# Patient Record
Sex: Female | Born: 1955 | Race: Black or African American | Hispanic: No | Marital: Married | Smoking: Former smoker
Health system: Southern US, Community
[De-identification: ages and names within clinical notes are randomized; demographics above are authoritative.]

## PROBLEM LIST (undated history)

## (undated) ENCOUNTER — Emergency Department (HOSPITAL_COMMUNITY): Admission: EM | Payer: Medicare Other | Source: Home / Self Care

## (undated) DIAGNOSIS — D649 Anemia, unspecified: Secondary | ICD-10-CM

## (undated) DIAGNOSIS — K219 Gastro-esophageal reflux disease without esophagitis: Secondary | ICD-10-CM

## (undated) DIAGNOSIS — Z972 Presence of dental prosthetic device (complete) (partial): Secondary | ICD-10-CM

## (undated) DIAGNOSIS — F419 Anxiety disorder, unspecified: Secondary | ICD-10-CM

## (undated) DIAGNOSIS — I10 Essential (primary) hypertension: Secondary | ICD-10-CM

## (undated) DIAGNOSIS — G5600 Carpal tunnel syndrome, unspecified upper limb: Secondary | ICD-10-CM

## (undated) DIAGNOSIS — E669 Obesity, unspecified: Secondary | ICD-10-CM

## (undated) DIAGNOSIS — E7800 Pure hypercholesterolemia, unspecified: Secondary | ICD-10-CM

## (undated) DIAGNOSIS — I209 Angina pectoris, unspecified: Secondary | ICD-10-CM

## (undated) HISTORY — DX: Carpal tunnel syndrome, unspecified upper limb: G56.00

## (undated) HISTORY — PX: SHOULDER SURGERY: SHX246

## (undated) HISTORY — PX: TONSILLECTOMY: SUR1361

## (undated) HISTORY — PX: ABDOMINAL HYSTERECTOMY: SHX81

## (undated) HISTORY — PX: BLADDER SURGERY: SHX569

## (undated) HISTORY — PX: BRAIN SURGERY: SHX531

## (undated) HISTORY — PX: VAGINAL HYSTERECTOMY: SUR661

## (undated) HISTORY — PX: COCCYX REMOVAL: SHX600

## (undated) HISTORY — PX: BACK SURGERY: SHX140

---

## 1998-02-19 ENCOUNTER — Ambulatory Visit (HOSPITAL_COMMUNITY): Admission: RE | Admit: 1998-02-19 | Discharge: 1998-02-19 | Payer: Self-pay | Admitting: *Deleted

## 1998-06-09 ENCOUNTER — Ambulatory Visit (HOSPITAL_COMMUNITY): Admission: RE | Admit: 1998-06-09 | Discharge: 1998-06-09 | Payer: Self-pay | Admitting: Internal Medicine

## 1998-09-09 ENCOUNTER — Encounter: Admission: RE | Admit: 1998-09-09 | Discharge: 1998-12-04 | Payer: Self-pay | Admitting: Internal Medicine

## 1998-09-27 ENCOUNTER — Emergency Department (HOSPITAL_COMMUNITY): Admission: EM | Admit: 1998-09-27 | Discharge: 1998-09-27 | Payer: Self-pay

## 1999-01-06 ENCOUNTER — Encounter: Admission: RE | Admit: 1999-01-06 | Discharge: 1999-01-07 | Payer: Self-pay | Admitting: Internal Medicine

## 1999-01-21 ENCOUNTER — Other Ambulatory Visit: Admission: RE | Admit: 1999-01-21 | Discharge: 1999-01-21 | Payer: Self-pay | Admitting: *Deleted

## 1999-04-13 ENCOUNTER — Encounter: Admission: RE | Admit: 1999-04-13 | Discharge: 1999-04-28 | Payer: Self-pay | Admitting: Internal Medicine

## 1999-07-27 ENCOUNTER — Encounter: Admission: RE | Admit: 1999-07-27 | Discharge: 1999-10-25 | Payer: Self-pay | Admitting: Orthopedic Surgery

## 1999-09-28 ENCOUNTER — Encounter: Admission: RE | Admit: 1999-09-28 | Discharge: 1999-11-18 | Payer: Self-pay | Admitting: Internal Medicine

## 1999-09-28 ENCOUNTER — Encounter: Admission: RE | Admit: 1999-09-28 | Discharge: 1999-09-28 | Payer: Self-pay | Admitting: General Surgery

## 1999-09-28 ENCOUNTER — Encounter: Payer: Self-pay | Admitting: General Surgery

## 1999-10-25 ENCOUNTER — Encounter: Admission: RE | Admit: 1999-10-25 | Discharge: 2000-01-23 | Payer: Self-pay | Admitting: Orthopedic Surgery

## 1999-12-04 ENCOUNTER — Emergency Department (HOSPITAL_COMMUNITY): Admission: EM | Admit: 1999-12-04 | Discharge: 1999-12-04 | Payer: Self-pay | Admitting: Emergency Medicine

## 2000-01-24 ENCOUNTER — Encounter: Admission: RE | Admit: 2000-01-24 | Discharge: 2000-04-23 | Payer: Self-pay | Admitting: Internal Medicine

## 2000-01-31 ENCOUNTER — Other Ambulatory Visit: Admission: RE | Admit: 2000-01-31 | Discharge: 2000-01-31 | Payer: Self-pay | Admitting: *Deleted

## 2000-05-22 ENCOUNTER — Encounter: Admission: RE | Admit: 2000-05-22 | Discharge: 2000-08-20 | Payer: Self-pay | Admitting: Orthopedic Surgery

## 2000-06-19 ENCOUNTER — Emergency Department (HOSPITAL_COMMUNITY): Admission: EM | Admit: 2000-06-19 | Discharge: 2000-06-19 | Payer: Self-pay | Admitting: Emergency Medicine

## 2000-10-02 ENCOUNTER — Encounter: Payer: Self-pay | Admitting: *Deleted

## 2000-10-02 ENCOUNTER — Encounter: Admission: RE | Admit: 2000-10-02 | Discharge: 2000-10-02 | Payer: Self-pay | Admitting: *Deleted

## 2001-01-13 ENCOUNTER — Emergency Department (HOSPITAL_COMMUNITY): Admission: EM | Admit: 2001-01-13 | Discharge: 2001-01-13 | Payer: Self-pay | Admitting: Emergency Medicine

## 2001-02-22 ENCOUNTER — Other Ambulatory Visit: Admission: RE | Admit: 2001-02-22 | Discharge: 2001-02-22 | Payer: Self-pay | Admitting: *Deleted

## 2001-03-27 ENCOUNTER — Ambulatory Visit (HOSPITAL_COMMUNITY): Admission: RE | Admit: 2001-03-27 | Discharge: 2001-03-27 | Payer: Self-pay | Admitting: Neurosurgery

## 2001-03-27 ENCOUNTER — Encounter: Payer: Self-pay | Admitting: Neurosurgery

## 2001-06-01 ENCOUNTER — Encounter: Payer: Self-pay | Admitting: Emergency Medicine

## 2001-06-01 ENCOUNTER — Emergency Department (HOSPITAL_COMMUNITY): Admission: EM | Admit: 2001-06-01 | Discharge: 2001-06-01 | Payer: Self-pay | Admitting: Emergency Medicine

## 2001-06-13 ENCOUNTER — Encounter: Payer: Self-pay | Admitting: Internal Medicine

## 2001-06-13 ENCOUNTER — Encounter: Admission: RE | Admit: 2001-06-13 | Discharge: 2001-06-13 | Payer: Self-pay | Admitting: Internal Medicine

## 2001-10-04 ENCOUNTER — Encounter: Payer: Self-pay | Admitting: *Deleted

## 2001-10-04 ENCOUNTER — Encounter: Admission: RE | Admit: 2001-10-04 | Discharge: 2001-10-04 | Payer: Self-pay | Admitting: *Deleted

## 2001-11-27 ENCOUNTER — Encounter: Admission: RE | Admit: 2001-11-27 | Discharge: 2001-11-27 | Payer: Self-pay | Admitting: Endocrinology

## 2001-11-27 ENCOUNTER — Encounter: Payer: Self-pay | Admitting: Endocrinology

## 2002-05-16 ENCOUNTER — Encounter: Payer: Self-pay | Admitting: Neurosurgery

## 2002-05-21 ENCOUNTER — Inpatient Hospital Stay (HOSPITAL_COMMUNITY): Admission: RE | Admit: 2002-05-21 | Discharge: 2002-05-27 | Payer: Self-pay | Admitting: Neurosurgery

## 2002-05-21 ENCOUNTER — Encounter: Payer: Self-pay | Admitting: Neurosurgery

## 2002-09-26 ENCOUNTER — Encounter: Admission: RE | Admit: 2002-09-26 | Discharge: 2002-09-26 | Payer: Self-pay | Admitting: Neurosurgery

## 2002-09-26 ENCOUNTER — Encounter: Payer: Self-pay | Admitting: Neurosurgery

## 2002-10-07 ENCOUNTER — Encounter: Admission: RE | Admit: 2002-10-07 | Discharge: 2002-10-07 | Payer: Self-pay | Admitting: *Deleted

## 2002-10-07 ENCOUNTER — Encounter: Payer: Self-pay | Admitting: *Deleted

## 2002-10-09 ENCOUNTER — Encounter: Admission: RE | Admit: 2002-10-09 | Discharge: 2002-10-09 | Payer: Self-pay | Admitting: Neurosurgery

## 2002-10-09 ENCOUNTER — Encounter: Payer: Self-pay | Admitting: Neurosurgery

## 2002-12-29 ENCOUNTER — Emergency Department (HOSPITAL_COMMUNITY): Admission: EM | Admit: 2002-12-29 | Discharge: 2002-12-29 | Payer: Self-pay | Admitting: Emergency Medicine

## 2002-12-29 ENCOUNTER — Encounter: Payer: Self-pay | Admitting: Emergency Medicine

## 2003-01-13 ENCOUNTER — Encounter: Admission: RE | Admit: 2003-01-13 | Discharge: 2003-04-13 | Payer: Self-pay

## 2003-03-12 ENCOUNTER — Encounter: Admission: RE | Admit: 2003-03-12 | Discharge: 2003-03-12 | Payer: Self-pay | Admitting: Endocrinology

## 2003-03-12 ENCOUNTER — Encounter: Payer: Self-pay | Admitting: Endocrinology

## 2003-11-15 DIAGNOSIS — G930 Cerebral cysts: Secondary | ICD-10-CM

## 2003-11-15 HISTORY — DX: Cerebral cysts: G93.0

## 2004-03-03 ENCOUNTER — Encounter: Admission: RE | Admit: 2004-03-03 | Discharge: 2004-03-03 | Payer: Self-pay | Admitting: Internal Medicine

## 2004-03-04 ENCOUNTER — Encounter: Admission: RE | Admit: 2004-03-04 | Discharge: 2004-03-04 | Payer: Self-pay | Admitting: Endocrinology

## 2004-04-14 ENCOUNTER — Emergency Department (HOSPITAL_COMMUNITY): Admission: EM | Admit: 2004-04-14 | Discharge: 2004-04-15 | Payer: Self-pay | Admitting: Emergency Medicine

## 2004-07-21 ENCOUNTER — Encounter: Admission: RE | Admit: 2004-07-21 | Discharge: 2004-07-21 | Payer: Self-pay | Admitting: Endocrinology

## 2004-12-06 ENCOUNTER — Encounter: Admission: RE | Admit: 2004-12-06 | Discharge: 2004-12-06 | Payer: Self-pay | Admitting: Endocrinology

## 2005-04-19 ENCOUNTER — Encounter: Admission: RE | Admit: 2005-04-19 | Discharge: 2005-07-18 | Payer: Self-pay | Admitting: Endocrinology

## 2005-07-26 ENCOUNTER — Ambulatory Visit (HOSPITAL_BASED_OUTPATIENT_CLINIC_OR_DEPARTMENT_OTHER): Admission: RE | Admit: 2005-07-26 | Discharge: 2005-07-26 | Payer: Self-pay | Admitting: Orthopedic Surgery

## 2005-07-26 ENCOUNTER — Ambulatory Visit (HOSPITAL_COMMUNITY): Admission: RE | Admit: 2005-07-26 | Discharge: 2005-07-26 | Payer: Self-pay | Admitting: Orthopedic Surgery

## 2005-12-07 ENCOUNTER — Encounter: Admission: RE | Admit: 2005-12-07 | Discharge: 2005-12-07 | Payer: Self-pay | Admitting: Endocrinology

## 2006-02-14 ENCOUNTER — Encounter: Admission: RE | Admit: 2006-02-14 | Discharge: 2006-02-14 | Payer: Self-pay | Admitting: Neurosurgery

## 2006-03-07 ENCOUNTER — Ambulatory Visit (HOSPITAL_COMMUNITY): Admission: RE | Admit: 2006-03-07 | Discharge: 2006-03-07 | Payer: Self-pay | Admitting: Neurosurgery

## 2006-03-21 ENCOUNTER — Encounter: Admission: RE | Admit: 2006-03-21 | Discharge: 2006-03-21 | Payer: Self-pay | Admitting: Endocrinology

## 2006-12-08 ENCOUNTER — Encounter: Admission: RE | Admit: 2006-12-08 | Discharge: 2006-12-08 | Payer: Self-pay | Admitting: Endocrinology

## 2007-04-05 ENCOUNTER — Emergency Department (HOSPITAL_COMMUNITY): Admission: EM | Admit: 2007-04-05 | Discharge: 2007-04-05 | Payer: Self-pay | Admitting: Emergency Medicine

## 2007-06-02 ENCOUNTER — Encounter: Admission: RE | Admit: 2007-06-02 | Discharge: 2007-06-02 | Payer: Self-pay | Admitting: Neurosurgery

## 2007-06-09 ENCOUNTER — Emergency Department (HOSPITAL_COMMUNITY): Admission: EM | Admit: 2007-06-09 | Discharge: 2007-06-09 | Payer: Self-pay | Admitting: Emergency Medicine

## 2007-12-19 ENCOUNTER — Encounter: Admission: RE | Admit: 2007-12-19 | Discharge: 2007-12-19 | Payer: Self-pay | Admitting: Orthopedic Surgery

## 2008-06-06 ENCOUNTER — Encounter: Admission: RE | Admit: 2008-06-06 | Discharge: 2008-06-06 | Payer: Self-pay | Admitting: Endocrinology

## 2008-12-22 ENCOUNTER — Encounter: Admission: RE | Admit: 2008-12-22 | Discharge: 2008-12-22 | Payer: Self-pay | Admitting: Neurosurgery

## 2009-01-19 ENCOUNTER — Emergency Department (HOSPITAL_COMMUNITY): Admission: EM | Admit: 2009-01-19 | Discharge: 2009-01-19 | Payer: Self-pay | Admitting: Emergency Medicine

## 2009-06-29 ENCOUNTER — Encounter: Admission: RE | Admit: 2009-06-29 | Discharge: 2009-06-29 | Payer: Self-pay | Admitting: Endocrinology

## 2010-03-15 ENCOUNTER — Encounter: Admission: RE | Admit: 2010-03-15 | Discharge: 2010-03-15 | Payer: Self-pay | Admitting: Gastroenterology

## 2010-05-12 ENCOUNTER — Encounter: Admission: RE | Admit: 2010-05-12 | Discharge: 2010-05-12 | Payer: Self-pay | Admitting: Neurosurgery

## 2010-05-24 ENCOUNTER — Encounter: Admission: RE | Admit: 2010-05-24 | Discharge: 2010-05-24 | Payer: Self-pay | Admitting: Neurosurgery

## 2010-12-02 ENCOUNTER — Encounter
Admission: RE | Admit: 2010-12-02 | Discharge: 2010-12-14 | Payer: Self-pay | Source: Home / Self Care | Attending: Endocrinology | Admitting: Endocrinology

## 2010-12-04 ENCOUNTER — Encounter: Payer: Self-pay | Admitting: Neurosurgery

## 2010-12-05 ENCOUNTER — Encounter: Payer: Self-pay | Admitting: Neurosurgery

## 2010-12-05 ENCOUNTER — Encounter: Payer: Self-pay | Admitting: Endocrinology

## 2010-12-21 ENCOUNTER — Other Ambulatory Visit: Payer: Self-pay | Admitting: Neurosurgery

## 2010-12-21 DIAGNOSIS — M542 Cervicalgia: Secondary | ICD-10-CM

## 2010-12-21 DIAGNOSIS — M5410 Radiculopathy, site unspecified: Secondary | ICD-10-CM

## 2010-12-24 ENCOUNTER — Ambulatory Visit
Admission: RE | Admit: 2010-12-24 | Discharge: 2010-12-24 | Disposition: A | Discharge status: Home / Self Care | Payer: Commercial Indemnity | Source: Ambulatory Visit | Attending: Neurosurgery | Admitting: Neurosurgery

## 2010-12-24 DIAGNOSIS — M5410 Radiculopathy, site unspecified: Secondary | ICD-10-CM

## 2010-12-24 DIAGNOSIS — M542 Cervicalgia: Secondary | ICD-10-CM

## 2011-01-06 ENCOUNTER — Emergency Department (HOSPITAL_COMMUNITY)
Admission: EM | Admit: 2011-01-06 | Discharge: 2011-01-06 | Disposition: A | Discharge status: Home / Self Care | Payer: Commercial Indemnity | Attending: Emergency Medicine | Admitting: Emergency Medicine

## 2011-01-06 DIAGNOSIS — Z794 Long term (current) use of insulin: Secondary | ICD-10-CM | POA: Insufficient documentation

## 2011-01-06 DIAGNOSIS — E119 Type 2 diabetes mellitus without complications: Secondary | ICD-10-CM | POA: Insufficient documentation

## 2011-01-06 LAB — GLUCOSE, CAPILLARY
Glucose-Capillary: 364 mg/dL — ABNORMAL HIGH (ref 70–99)
Glucose-Capillary: 335 mg/dL — ABNORMAL HIGH (ref 70–99)

## 2011-01-06 LAB — POCT I-STAT, CHEM 8
Creatinine, Ser: 0.7 mg/dL (ref 0.4–1.2)
Glucose, Bld: 367 mg/dL — ABNORMAL HIGH (ref 70–99)
HCT: 40 % (ref 36.0–46.0)
Hemoglobin: 13.6 g/dL (ref 12.0–15.0)
Potassium: 4.3 mEq/L (ref 3.5–5.1)
Sodium: 138 mEq/L (ref 135–145)
TCO2: 23 mmol/L (ref 0–100)

## 2011-02-24 LAB — POCT I-STAT, CHEM 8
BUN: 10 mg/dL (ref 6–23)
Calcium, Ion: 1.09 mmol/L — ABNORMAL LOW (ref 1.12–1.32)
Chloride: 106 mEq/L (ref 96–112)
Creatinine, Ser: 0.7 mg/dL (ref 0.4–1.2)
Glucose, Bld: 167 mg/dL — ABNORMAL HIGH (ref 70–99)
HCT: 36 % (ref 36.0–46.0)

## 2011-04-01 NOTE — Discharge Summary (Signed)
[place]  Patient:    Bonnie [name], Bonnie [name] Visit Number: [id number] MRN: [id number]          Service Type: SUR Location: 3000 3005 01 Attending Physician:  Danella Penton Dictated by:   Tanya Nones. Jeral Fruit, M.D. Admit Date:  05/21/2002 Discharge Date: 05/27/2002                             Discharge Summary  ADMISSION DIAGNOSIS: Degenerative disk disease at the level of L4-5 and 5-1 with chronic back pain.  DISCHARGE DIAGNOSES: Degenerative disk disease at the level of L4-5 and 5-1 with chronic back pain.  HISTORY OF PRESENT ILLNESS: The patient was admitted because of three years of history of back pain with radiation down both legs which is getting worse despite conservative treatment. X-rays showed worsening of the process. Surgery was advised.  LABORATORY DATA: On admission her white cell count was 7.8 and the hemoglobin was 11.2. She has a history of chronic anemia. After surgery this went down to 7.3. The hemoglobin after two transfusions was 9.6. The remainder were normal.  HOSPITAL COURSE: The patient was taken to surgery and a bilateral diskectomy followed by using a bone graft and plate was done. After surgery, the patient was slow to get around but finally, she is ambulating and she is feels like she is ready to go home. Today, she is feeling much better. The patient has a chronic history of diabetes and she has a chronic peripheral neuropathy.  DISCHARGE CONDITION: Improved.  DISCHARGE MEDICATIONS: Percocet and Diazepam. She is to continue taking her medications for diabetes as well as her Neurontin.  FOLLOW-UP: I will be seeing her in my office in four to five weeks. She was encouraged to call me in case of any problems.  DIET: To continue with a diabetic diet. Dictated by:   Tanya Nones. Jeral Fruit, M.D. Attending Physician:  Danella Penton DD:  05/27/02 TD:  05/28/02 Job: [id number]

## 2011-04-01 NOTE — Op Note (Signed)
[place]  Patient:    Bonnie [name], Bonnie [name] Visit Number: [id number] MRN: [id number]          Service Type: SUR Location: 3000 3005 01 Attending Physician:  Danella Penton Dictated by:   Tanya Nones. Jeral Fruit, M.D. Proc. Date: 05/21/02 Admit Date:  05/21/2002   CC:         Lenon Curt. Cassell Clement, M.D.   Operative Report  PREOPERATIVE DIAGNOSIS:  L4-5, L5-S1 degenerative disk disease with chronic back pain and bilateral radiculopathies.  POSTOPERATIVE DIAGNOSIS:  L4-5, L5-S1 degenerative disk disease with chronic back pain and bilateral radiculopathies.  OPERATION PERFORMED:  Bilateral L4-5, L5-S1 diskectomy, interbody fusion, allograft, pedicle screw L4 to L5, S1 bilaterally, posterolateral fusion.  SURGEON:  Tanya Nones. Jeral Fruit, M.D.  ASSISTANT:  Cristi Loron, M.D.  ANESTHESIA:  INDICATIONS FOR PROCEDURE:  The patient was admitted because of back pain with radiation to both legs which was getting worse.  By the time I saw her in my office the patient was unable to get out of the bed.  Surgery was advised. The patient has failed with her receiving of conservative treatment.  The risks were explained in the history and physical.  DESCRIPTION OF PROCEDURE:  The patient was taken to the operating room and was positioned in a prone manner.  A midline incision from L4 to S1 was made. Muscle was retracted all the way laterally until we were able to find the transverse process of 4-5 and the ala of the sacrum.  Then we started doing hemilaminectomies at the level of 5-1 and 4.  The 4-5 and 5-1 disk space were identified bilaterally and total gross diskectomy was accomplished. ____________ laminotomy was done.  The L4-5 and 5-1 facet were removed using the Kerrison punch as well as the drill.  Lysis of adhesions was accomplished. Indeed, part of the facet, especially at the level of L4, was really soft and the pedicle was really weak.  Then we  proceeded with bilateral diskectomy at 4-5 and then at 5-1.  The end plates were shaved with curets and then we introduced a piece of allograft bone at the level of 4-5, 26 x 12 at the level 5-1, we introduced one 10 x 12 and the other one 8 x 12.  Autograft was used between the bone graft and also lateral to the disk to fill out the space. From then on with the C-arm we were able to identify the pedicle of 4, 5 and 1.  We introduced the pedicle probe followed by the tap followed by the screw. The screws were 5.5 x 45 at the level 4 and 5 except at the level of S1 which was 35.  Having good position of the pedicle screw not only with a view lateral but also anterior posterior, we put a rod at that level and the _____ was tied in place.  We went laterally, drilled the transverse process of 4-5 in the ala of the sacrum and the rest of the autograft was introduced to clean out the area.  From then on Valsalva maneuver was negative.  The area was irrigated.  Fentanyl was left in the epidural space and the wound was closed with Vicryl and Steri-Strips.  The patient did well. Dictated by:   Tanya Nones. Jeral Fruit, M.D. Attending Physician:  Danella Penton DD:  05/21/02 TD:  05/23/02 Job: [id number]

## 2011-04-01 NOTE — Consult Note (Signed)
NAMESTEFFANY, SCHOENFELDER                               ACCOUNT NO.:  [id number]   MEDICAL RECORD NO.:  [id number]                   PATIENT TYPE:  EMS   LOCATION:  MAJO                                 FACILITY:  [facility]   PHYSICIAN:  Vania Rea, M.D.              DATE OF BIRTH:  07/06/1956   DATE OF CONSULTATION:  DATE OF DISCHARGE:                                   CONSULTATION   ADDENDUM:  The patient refused CT scan of the chest because she would have  to be switched from her Metformin for 48 hours.  She has decided she wishes  to go and she will try to see her primary care physician today.                                               Vania Rea, M.D.    LC/MEDQ  D:  04/15/2004  T:  04/15/2004  Job:  [id number]

## 2011-04-01 NOTE — Consult Note (Signed)
Bonnie [name], Bonnie [name]                               ACCOUNT NO.:  [id number]   MEDICAL RECORD NO.:  [id number]                   PATIENT TYPE:  INP   LOCATION:                                       FACILITY:  [facility]   PHYSICIAN:  Zachary George, DO                      DATE OF BIRTH:  14-Oct-1956   DATE OF CONSULTATION:  01/22/2003  DATE OF DISCHARGE:                                   CONSULTATION   Dear Dr. Jeral Fruit,   Thank you very much for kindly referring the [facility] to [facility].  The patient was seen today in  our clinic today.  Please refer to the following for details regarding the  history, physical examination and treatment plan.  Once again, thank you for  allowing [place] to participate in the care of the patient.   CHIEF COMPLAINT:  Pain from lumbar surgery on right side.   HISTORY:  The patient is a pleasant 55 year old right-hand dominant female  who is status post L4-5, L5-S1 diskectomy and interbody fusion with pedicle  screw fixation L4 through S1 on 05/21/02.  The patient states she was  gradually improving and recovering from her surgery until about four weeks  ago when she rolled off of her bed and fell onto the floor.  She states she  went to the emergency department where they took x-rays and states that the  x-rays were okay.  There was nothing the matter with the hardware in her  back.  She currently complains of pain in her right lower back which  radiates to her right lateral hip and buttock.  She denies any radicular  pain in her lower extremities bilaterally.  She was previously going to  aquatic therapy until this fall when she also sustained some type of  abrasion to her left knee and quit going to aquatic therapy because of the  open wound.  She does want to get back into aquatic therapy which she states  was helping her.  Her pain level today is a 8 to 9/10 on a subjective scale  described as constant, dull,  achy, throbbing, sharp, burning with associated  numbness at times.  She notes that prior to her surgery she had significant  left lower extremity radicular pain which is significantly improved with  current medication regimen.  Her symptoms are worse with walking, bending,  sitting, working and land based physical therapy.  Symptoms are improved  with rest and aquatic therapy as well as medications.  Currently, she takes  Neurontin 2400 mg per day in divided doses which seems to help her radicular  symptoms.  She also takes nabumetone 750 mg two pills at bedtime.  She has  been on oxycodone 5 mg for prolonged period and she states that she actually  tried to stop the oxycodone cold [place] secondary to she did not feel it was  helping her.  She states that she had some withdrawal symptoms and she was  started on hydrocodone by her primary care Chad Donoghue.  She currently takes  hydrocodone 5 mg/500 mg up to four pills per day.  She has also been taking  diazepam 5 mg up to four times a day for a prolonged period, but states  recently she has been trying to cut back to either no diazepam per day or up  to two pills per day.  She does not really feel like the narcotic based pain  medication is helping her pain and she states that she feels drugged up all  the time.  She would like ultimately to get off of as much medication as  possible.   I reviewed health and history form and 14 point review of systems.  Function  and quality of life indices have declined.  Sleep is fair to poor.  The  patient denies bowel or bladder dysfunction with the exception of  constipation which she attributes to the medications.  She denies fevers,  chills, night sweats or weight loss.  She does admit to some unexplained  weight gain, dizziness, arthritis, depression, nervous disorder and numbness  occasionally in her left lower extremity.   PAST MEDICAL HISTORY:  Psoriasis, anemia, diabetes.   PAST SURGICAL  HISTORY:  Lumbar surgery as noted.   FAMILY HISTORY:  Lung disease, cancer, diabetes, disability.   SOCIAL HISTORY:  The patient denies smoking or alcohol use.  She denies  illicit drug use.  She denies history of alcohol or drug abuse.  She is  married and not currently working.  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   ALLERGIES:  BIAXIN.   MEDICATIONS:  1. Clonazepam 1 mg at bedtime.  2. Neurontin 300 mg three pills in the morning, two pills in the afternoon     and three pills in the evening.  3. Nabumatone 750 mg two pills at bedtime.  4. Diazepam 5 mg up to four times a day as needed.  5. Fergon one tab per day.  6. Glucovance 25 mg two tabs per day.  7. Hydrocodone 5 mg/500 mg four times a day as needed.   PHYSICAL EXAMINATION:  GENERAL:  Healthy-appearing female in no acute  distress.  Mood and affect are appropriate today. The patient  is alert and  oriented.  VITAL SIGNS:  Blood pressure 119/77, pulse 90, respirations 20, O2  saturation 98% on room air.  BACK:  Reveals level pelvis without scoliosis.  There is slightly decreased  lumbar lordosis with vertical midline incisional scar which is well healed.  There is no bruising or skin lesions noted in the lumbar and gluteal region.  Palpatory examination reveals very tender to light palpation of the lumbar  paraspinal muscles and gluteal muscles bilaterally with almost an  exaggerated response.  The tenderness is rather diffuse and nonfocal at this  time.  Range of motion of the lumbar spine is guarded in all planes.  Manual  muscle testing is 5/5 bilateral lower extremities with the exception of 4/5  left extensor hallicis longus with significant give-way weakness throughout.  Sensory examination is intact to light touch bilateral lower extremities in  all dermatomal distributions at this time.  Muscle stretch reflexes are 0/4 bilateral patellar, medial hamstrings and  Achilles.  Toes are  downgoing  bilaterally.  No ankle clonus noted.  There is no abnormal tone in the lower  extremities.  Straight leg raise is difficult to assess secondary to  guarding and resistance.  The patient is noted to have tight hamstrings,  however.  Any flexion of the hips causes increased lower back pain.  Faber  maneuver is equivocal on the right, negative on the left reproducing pain in  the right buttock to some degree.  There is no heat, erythema or edema in  the lower extremities.   IMPRESSION:  1. Chronic low back pain, status post L4-5, L5-S1 diskectomy with interbody     fusion and pedicle screw fixation L4 through S1 with exacerbation     following a recent fall.  I wonder whether or not the patient has a     component of sacroiliac joint pain given her physical exam findings     especially consideration after a fall.  2. Depression.  The patient seems to have a very low pain threshold on     physical exam today.   PLAN:  Discussed treatment options with the patient.  1. Would like to resume aquatic therapy.  2. Continue hydrocodone 5 mg up to four times a day as needed for right now.     Will consider transitioning her to non-narcotic alternatives such as     Ultram or Ultracet.  3. Continue Neurontin at its current dose for now.  4. Change Diazepam to only as needed for spasm.  5. Will re-evaluate patient in one month.  If she continues right buttock     pain would consider diagnostic/therapeutic sacroiliac joint injection.  6. The patient to return to clinic in one month for reevaluation or sooner     as needed.   The patient was educated in the above findings and recommendations and  understands.  There were no barriers to communication.                                                Zachary George, DO    JW/MEDQ  D:  01/22/2003  T:  01/22/2003  Job:  [id number]

## 2011-04-01 NOTE — Op Note (Signed)
NAME:  Bonnie [name], Bonnie [name]                   ACCOUNT NO.:  [id number]   MEDICAL RECORD NO.:  [id number]          PATIENT TYPE:  AMB   LOCATION:  DSC                          FACILITY:  [facility]   PHYSICIAN:  Katy Fitch. Sypher, M.D. DATE OF BIRTH:  1956/01/11   DATE OF PROCEDURE:  07/26/2005  DATE OF DISCHARGE:                                 OPERATIVE REPORT   PREOPERATIVE DIAGNOSIS:  Chronic entrapped neuropathy, right median nerve at  carpal tunnel.   POSTOPERATIVE DIAGNOSIS:  Chronic entrapped neuropathy, right median nerve  at carpal tunnel.   OPERATION:  Release of right transverse carpal ligament.   OPERATING SURGEON:  Katy Fitch. Sypher, M.D.   ASSISTANT:  Molly Maduro Dasnoit PA-C.   ANESTHESIA:  General by LMA.   SUPERVISED ANESTHESIOLOGIST:  Bedelia Person, M.D.   INDICATIONS:  Bonnie [name] is a 55 year old woman, referred for evaluation and  management of right hand pain and numbness. Clinical examination revealed  signs of carpal tunnel syndrome and electrodiagnostic studies confirmed  median neuropathy at the level of the right transverse carpal ligament.  After informed consent, she is brought to the operating room at this time.   PROCEDURE:  Bonnie [name] was brought to the operating room and placed supine  position on the table.  Following the induction of general anesthesia by LMA  technique, the right arm was prepped with Betadine soapy solution and  sterilely draped. A pneumatic tourniquet was applied to the proximal  brachium.  Following  exsanguination of the limb with an Esmarch bandage, the arterial tourniquet  in the proximal brachium was inflated to 220 mmHg.   The procedure commenced with a short incision in the line of the ring finger  and the palm.  Subcutaneous tissues were carefully divided, revealing the  palmar fascia. This was split longitudinally with the common __________  branch of the median nerve.  These were followed back to the transverse  carpal ligament, which was  carefully isolated from the median nerve. A  Penfield 4 elevator was used to gently release the bursa adjacent to the  nerve.  The transverse carpal ligament was then released with the scissors,  subcutaneously extending into the distal forearm with subcutaneous release  of the volar carpal ligament.   Bleeding points were cauterized with bipolar current, followed by inspection  of the carpal canal with the aid of a [facility] retractor. There was  hypertrophic bursitis noted. No mass or other predicaments were identified.   The wound was then repaired with intradermal 3-0 Prolene suture.  A  compressive dressing was applied with volar plaster splint, maintaining  there was 5 degrees dorsiflexion.      Katy Fitch Sypher, M.D.  Electronically Signed     RVS/MEDQ  D:  07/26/2005  T:  07/26/2005  Job:  [id number]

## 2011-04-01 NOTE — H&P (Signed)
[place]. [facility]  Patient:    Bonnie [name], Bonnie [name] Visit Number: [id number] MRN: [id number]          Service Type: SUR Location: 3000 3005 01 Attending Physician:  Danella Penton Dictated by:   Tanya Nones. Jeral Fruit, M.D. Admit Date:  05/21/2002                           History and Physical  HISTORY OF PRESENT ILLNESS:  The patient is a lady who had been seen by me since the year 2000 because of back pain.  Lately, the pain is getting worse. She is quite miserable.  The pain goes down to both legs.  She is unable to sit and stand.  The patient has had no improvement with conservative treatment.  She realized that lately she had difficulty moving her legs.  The patient had several x-rays, including MRI and myelogram last year.  Because of the findings, she is being admitted for surgery.  PAST MEDICAL HISTORY:  She had a pilonidal cyst removed many years ago.  ALLERGIES:  She is allergic to [facility].  SOCIAL HISTORY:  The patient smokes a half a pack a daily ______.  FAMILY HISTORY:  Mother died when she was 35 with diabetes and father died at the age of 42 with cancer of the lung.  REVIEW OF SYSTEMS:  Review of systems is positive for back pain, neck pain, diabetes and psoriasis.  PHYSICAL EXAMINATION:  GENERAL:  A patient who came to my office with her husband.  She had difficulty sitting and standing.  HEENT:  Unremarkable.  NECK:  Unremarkable.  CARDIOVASCULAR:  Unremarkable.  LUNGS:  Unremarkable.  ABDOMEN:  Unremarkable.  EXTREMITIES:  Unremarkable.  NEUROLOGIC:  Mental status:  Unremarkable.  Cranial nerves:  Unremarkable. Strength is 5/5 except for some mild weakness on dorsiflexion of both feet. Sensation normal.  Reflexes are 1+.  Lumbar spine:  There is a scar in the lumbar area to the sacrum from previous surgery.  She is able to flex but extension and lateralization are quite difficult.  She has difficulty lifting both legs up  to the ______  and at about 45 degrees, she complains of pain in both thighs and in the lumbar area.  Coordination normal.  LABORATORY AND ACCESSORY DATA:  The lumbar x-ray with flexion and extension showed no subluxation but she has a severe case of degenerative disk disease at the level of 4-5 and also 5-1.  CLINICAL IMPRESSION:  Chronic degenerative disk disease, 4-5, 5-1.  RECOMMENDATION:  I talked to her and her husband.  We looked at the MRIs as well as the myelogram.  Indeed, she has a severe case of degenerative disk disease in the lower two levels of the lumbar spine.  When we compared from the previous one including the x-rays going back to the year 2000, indeed, there has been worsening of the situation.  The procedure will be bilateral 4-5, 5-1 diskectomies, posterolateral fusion, pedicle screws.  The patient knows about the risks such as no improvement whatsoever, infection, CSF leak, need for further surgery, numbness, failure of the surgery to relieve the pain, need of exploration, recurrent disk, failure of the pedicle screws, collapse of the bone graft and also the damage to abdominal arteries and ureters.  The patient declined more opinion. Dictated by:   Tanya Nones. Jeral Fruit, M.D. Attending Physician:  Danella Penton DD:  05/21/02 TD:  05/23/02 Job: [id number]  [id number]

## 2011-04-01 NOTE — Consult Note (Signed)
Bonnie [name], Bonnie [name] NO.:  [id number]   MEDICAL RECORD NO.:  [id number]                   PATIENT TYPE:  EMS   LOCATION:  [facility]                                 FACILITY:  [facility]   PHYSICIAN:  Vania Rea, M.D.              DATE OF BIRTH:  Jun 26, 1956   DATE OF CONSULTATION:  04/15/2004  DATE OF DISCHARGE:                                   CONSULTATION   REFERRING PHYSICIAN:  Doug Sou, M.D.   REASON FOR CONSULTATION:  Presyncopal episode.   PRIMARY CARE PHYSICIAN:  Darci Needle, M.D.   NEUROSURGEON:  Dr. Zachery Conch.   PAIN SPECIALIST:  Hilda Lias, M.D.   HISTORY OF PRESENT ILLNESS:  This is a 55 year old African-American lady  with a history of diabetes, Type II, chronic back pain who was discharged  from [facility] on April 14, 2004 around midday and was told to resume her  normal antidiabetic regimen.  At about 5 P.M. yesterday, on the day of  discharge, the patient had a small meal.  Three hours later she checked her  blood sugar and is was 303.  For some reason the patient because nervous and  anxious, and called [facility] and told them her blood sugar was high.  They told her to take 14 units of Lantus; unclear of the reason, but the  patient usually takes 20 units at bedtime.  Later after taking the Lantus  the patient became nervous again, states she was sweating, started to feel  hot.  She took her temperature, which was 100.4.  She then had a brief  syncopal episode she states lasting about 1-3 seconds.  Emergency Medical  services were called and their notes indicate that capillary blood sugar on  arrival was 306, her pulse was 88, her blood pressure was 152/74 and her  respiratory rate was 16.  She was saturating at 93% on room air and 98% on 2  liters by nasal cannula.  She complained of mild cranial discomfort due to  her recent brain surgery.   The patient currently denies chest pain, shortness of breath,  nausea,  vomiting, and diarrhea.  She had no leg cramps.   PAST MEDICAL HISTORY:  The past medical history is significant for:  1. Diabetes Type II.  2. The patient is status post removal of a meningioma from the right     parietal area at [facility].  3. The patient has hyperlipidemia.  4. The patient has chronic back pain status post multiple spinal surgeries.   MEDICATIONS:  Lantus, ranitidine, clonazepam, dexamethasone, Zetia,  nabumetone, Glucovance, and oxycodone.   ALLERGIES:  BIAXIN causes tightness of the chest.   SOCIAL HISTORY:  The patient is a retired Radiographer, therapeutic.  She used to  smoke half-a-pack for 20 years and discontinued this about two years ago.  Denies alcohol or illicit drug use.  FAMILY HISTORY:  The patient's family history is significant for diabetes in  her mother who lived into her 61s and cancer in her father who is in his  80s.   REVIEW OF SYSTEMS:  Review of systems is significant only for psoriasis,  which is not currently active, chronic back pain and weakness on the left  side of her body.   PHYSICAL EXAMINATION:  VITAL SIGNS:  Temperature 99.9 orally and 100.2  rectally.  Her blood pressure is 140/62.  Her pulse is 102.  Her  respirations are 18.  Her pain level is zero.  GENERAL APPEARANCE:  The patient is pale and anicteric.  She is not  dehydrated.  HEENT:  The patient has dentures.  She has no oral Candida.  NECK:  There is no lymphadenopathy.  CHEST:  The patient's chest is clear to auscultation bilaterally. There is  no chest wall tenderness.  CARDIOVASCULAR SYSTEM:  Regular rhythm.  No murmurs.  ABDOMEN:  The patient's abdomen is obese, soft and nontender.  EXTREMITIES:  Extremities are without edema.  She has 2+ pulses bilaterally.  NEUROLOGIC EXAMINATION:  CNS; cranial nerves II-XII are intact.  She is  hypersensitive to touch in the left lower extremity.   LABORATORY DATA:  The patient's hemoglobin is 12.9 and  hematocrit 38.  Sodium 136, potassium 3.8, chloride 100, CO2 30, BUN 7, and creatinine 0.6.  Her glucose is 158.  The pH is 7.4 and pCO2 43.  Her EKG showed normal sinus  rhythm.   ASSESSMENT:  1. Diabetes mellitus Type II, uncontrolled.  2. Anxiety disorder.  3. Rule out pulmonary embolism.   PLAN:  Since this patient shows marked symptoms of anxiety and also in the  interview she appears to be very anxious this is probably mostly due to  anxiety.  I explained to her that the symptoms she described are more  related to hypoglycemia rather than hyperglycemia, and that her blood sugars  in the range of 200-300 are normal on recent discharge from the hospital;  and, that her blood sugar control needs to be as an outpatient because of  __________ concerns.   We have explained to her that she needs diabetic education consult as an  outpatient.  We have explained to her that she needs to check her  fingersticks before every meal and at bedtime, to keep a record and to bring  it to her doctor and the diabetic dietitian.  She has already takes  clonazepam and we have recommended that she continue to take that .   The patient has been recently started on Decadron since discharge, which is  probably causing her blood sugars to be higher usual, but she is also eating  less than usual because of her recent surgery; and, so the two things will  probably balance each other out for the time being.  Since the patient had a  presyncopal episode, since she has been four days in the hospital  and it  sounds as if she was not anticoagulated because of the brain surgery we will  do a CT scan of  the chest to rule out pulmonary embolus.  If the CT scan of the chest is  negative the patient can be discharged with advice to keep a strong check on  her blood pressure and to see her primary care physician within a week.  Vania Rea, M.D.    LC/MEDQ  D:   04/15/2004  T:  04/15/2004  Job:  [id number]   cc:   Neurosurgeon Dr. Shiela Mayer, M.D.  [address]  [place], [place] [address]  Fax: [phone]   Brooke Bonito, M.D.  [address]  [place]  [place] [address]  Fax: [phone]

## 2011-04-04 ENCOUNTER — Other Ambulatory Visit: Payer: Self-pay | Admitting: Endocrinology

## 2011-04-04 ENCOUNTER — Ambulatory Visit
Admission: RE | Admit: 2011-04-04 | Discharge: 2011-04-04 | Disposition: A | Discharge status: Home / Self Care | Payer: Commercial Indemnity | Source: Ambulatory Visit | Attending: Endocrinology | Admitting: Endocrinology

## 2011-04-04 DIAGNOSIS — R0602 Shortness of breath: Secondary | ICD-10-CM

## 2011-04-04 MED ORDER — IOHEXOL 300 MG/ML  SOLN
125.0000 mL | Freq: Once | INTRAMUSCULAR | Status: AC | PRN
  Administered 2011-04-04: 125 mL via INTRAVENOUS

## 2011-07-07 ENCOUNTER — Emergency Department (HOSPITAL_COMMUNITY)
Admission: EM | Admit: 2011-07-07 | Discharge: 2011-07-08 | Disposition: A | Discharge status: Home / Self Care | Payer: Commercial Indemnity | Attending: Emergency Medicine | Admitting: Emergency Medicine

## 2011-07-07 ENCOUNTER — Emergency Department (HOSPITAL_COMMUNITY): Payer: Commercial Indemnity

## 2011-07-07 DIAGNOSIS — E119 Type 2 diabetes mellitus without complications: Secondary | ICD-10-CM | POA: Insufficient documentation

## 2011-07-07 DIAGNOSIS — K219 Gastro-esophageal reflux disease without esophagitis: Secondary | ICD-10-CM | POA: Insufficient documentation

## 2011-07-07 DIAGNOSIS — R000 Tachycardia, unspecified: Secondary | ICD-10-CM | POA: Insufficient documentation

## 2011-07-07 DIAGNOSIS — E7800 Pure hypercholesterolemia, unspecified: Secondary | ICD-10-CM | POA: Insufficient documentation

## 2011-07-07 DIAGNOSIS — R079 Chest pain, unspecified: Secondary | ICD-10-CM | POA: Insufficient documentation

## 2011-07-07 DIAGNOSIS — Z794 Long term (current) use of insulin: Secondary | ICD-10-CM | POA: Insufficient documentation

## 2011-07-07 DIAGNOSIS — R0602 Shortness of breath: Secondary | ICD-10-CM | POA: Insufficient documentation

## 2011-07-07 LAB — CK TOTAL AND CKMB (NOT AT ARMC)
CK, MB: 1.7 ng/mL (ref 0.3–4.0)
Relative Index: 1.5 (ref 0.0–2.5)
Total CK: 111 U/L (ref 7–177)

## 2011-07-07 LAB — GLUCOSE, CAPILLARY

## 2011-07-07 LAB — TROPONIN I: Troponin I: <0.3 ng/mL (ref <0.30)

## 2011-07-26 ENCOUNTER — Other Ambulatory Visit: Payer: Self-pay | Admitting: Gastroenterology

## 2011-07-28 ENCOUNTER — Ambulatory Visit
Admission: RE | Admit: 2011-07-28 | Discharge: 2011-07-28 | Disposition: A | Discharge status: Home / Self Care | Payer: Medicare Other | Source: Ambulatory Visit | Attending: Gastroenterology | Admitting: Gastroenterology

## 2011-10-01 ENCOUNTER — Observation Stay (HOSPITAL_COMMUNITY)
Admission: EM | Admit: 2011-10-01 | Discharge: 2011-10-02 | Disposition: A | Discharge status: Home / Self Care | Payer: Managed Care, Other (non HMO) | Attending: Internal Medicine | Admitting: Internal Medicine

## 2011-10-01 ENCOUNTER — Emergency Department (INDEPENDENT_AMBULATORY_CARE_PROVIDER_SITE_OTHER): Payer: Medicare Other

## 2011-10-01 ENCOUNTER — Encounter (HOSPITAL_COMMUNITY): Payer: Self-pay | Admitting: *Deleted

## 2011-10-01 ENCOUNTER — Other Ambulatory Visit: Payer: Self-pay

## 2011-10-01 ENCOUNTER — Emergency Department (HOSPITAL_COMMUNITY): Payer: Managed Care, Other (non HMO)

## 2011-10-01 ENCOUNTER — Emergency Department (INDEPENDENT_AMBULATORY_CARE_PROVIDER_SITE_OTHER)
Admission: EM | Admit: 2011-10-01 | Discharge: 2011-10-01 | Disposition: A | Discharge status: Nursing Home (Includes ICF) | Payer: Medicare Other | Source: Home / Self Care | Attending: Emergency Medicine | Admitting: Emergency Medicine

## 2011-10-01 DIAGNOSIS — K219 Gastro-esophageal reflux disease without esophagitis: Secondary | ICD-10-CM | POA: Insufficient documentation

## 2011-10-01 DIAGNOSIS — R0602 Shortness of breath: Secondary | ICD-10-CM | POA: Diagnosis present

## 2011-10-01 DIAGNOSIS — E669 Obesity, unspecified: Secondary | ICD-10-CM | POA: Insufficient documentation

## 2011-10-01 DIAGNOSIS — E7800 Pure hypercholesterolemia, unspecified: Secondary | ICD-10-CM | POA: Insufficient documentation

## 2011-10-01 DIAGNOSIS — E119 Type 2 diabetes mellitus without complications: Secondary | ICD-10-CM | POA: Diagnosis present

## 2011-10-01 DIAGNOSIS — R0781 Pleurodynia: Secondary | ICD-10-CM

## 2011-10-01 DIAGNOSIS — D649 Anemia, unspecified: Secondary | ICD-10-CM | POA: Insufficient documentation

## 2011-10-01 DIAGNOSIS — R079 Chest pain, unspecified: Principal | ICD-10-CM | POA: Insufficient documentation

## 2011-10-01 DIAGNOSIS — E113293 Type 2 diabetes mellitus with mild nonproliferative diabetic retinopathy without macular edema, bilateral: Secondary | ICD-10-CM | POA: Insufficient documentation

## 2011-10-01 DIAGNOSIS — R071 Chest pain on breathing: Secondary | ICD-10-CM

## 2011-10-01 DIAGNOSIS — F419 Anxiety disorder, unspecified: Secondary | ICD-10-CM | POA: Insufficient documentation

## 2011-10-01 DIAGNOSIS — R0789 Other chest pain: Secondary | ICD-10-CM | POA: Insufficient documentation

## 2011-10-01 DIAGNOSIS — F411 Generalized anxiety disorder: Secondary | ICD-10-CM | POA: Insufficient documentation

## 2011-10-01 DIAGNOSIS — I10 Essential (primary) hypertension: Secondary | ICD-10-CM | POA: Insufficient documentation

## 2011-10-01 DIAGNOSIS — Z794 Long term (current) use of insulin: Secondary | ICD-10-CM | POA: Insufficient documentation

## 2011-10-01 HISTORY — DX: Angina pectoris, unspecified: I20.9

## 2011-10-01 HISTORY — DX: Gastro-esophageal reflux disease without esophagitis: K21.9

## 2011-10-01 HISTORY — DX: Anxiety disorder, unspecified: F41.9

## 2011-10-01 HISTORY — DX: Pure hypercholesterolemia, unspecified: E78.00

## 2011-10-01 HISTORY — DX: Obesity, unspecified: E66.9

## 2011-10-01 HISTORY — DX: Anemia, unspecified: D64.9

## 2011-10-01 LAB — CBC
HCT: 35.2 % — ABNORMAL LOW (ref 36.0–46.0)
HCT: 35 % — ABNORMAL LOW (ref 36.0–46.0)
Hemoglobin: 11.6 g/dL — ABNORMAL LOW (ref 12.0–15.0)
Hemoglobin: 11.5 g/dL — ABNORMAL LOW (ref 12.0–15.0)
MCH: 24.2 pg — ABNORMAL LOW (ref 26.0–34.0)
MCHC: 32.9 g/dL (ref 30.0–36.0)
RBC: 4.73 MIL/uL (ref 3.87–5.11)
RDW: 16.2 % — ABNORMAL HIGH (ref 11.5–15.5)
RDW: 16 % — ABNORMAL HIGH (ref 11.5–15.5)
WBC: 7.1 10*3/uL (ref 4.0–10.5)

## 2011-10-01 LAB — BASIC METABOLIC PANEL
CO2: 25 mEq/L (ref 19–32)
Chloride: 100 mEq/L (ref 96–112)
Creatinine, Ser: 0.55 mg/dL (ref 0.50–1.10)
Glucose, Bld: 203 mg/dL — ABNORMAL HIGH (ref 70–99)
Sodium: 138 mEq/L (ref 135–145)

## 2011-10-01 LAB — DIFFERENTIAL
Basophils Absolute: 0 10*3/uL (ref 0.0–0.1)
Lymphocytes Relative: 36 % (ref 12–46)
Lymphs Abs: 2.5 10*3/uL (ref 0.7–4.0)
Monocytes Absolute: 0.5 10*3/uL (ref 0.1–1.0)
Neutro Abs: 3.9 10*3/uL (ref 1.7–7.7)

## 2011-10-01 LAB — CREATININE, SERUM
Creatinine, Ser: 0.51 mg/dL (ref 0.50–1.10)
GFR calc Af Amer: >90 mL/min (ref >90)
GFR calc non Af Amer: >90 mL/min (ref >90)

## 2011-10-01 LAB — GLUCOSE, CAPILLARY
Glucose-Capillary: 230 mg/dL — ABNORMAL HIGH (ref 70–99)
Glucose-Capillary: 251 mg/dL — ABNORMAL HIGH (ref 70–99)

## 2011-10-01 LAB — URINALYSIS, ROUTINE W REFLEX MICROSCOPIC
Glucose, UA: NEGATIVE mg/dL
Leukocytes, UA: NEGATIVE
pH: 5.5 (ref 5.0–8.0)

## 2011-10-01 LAB — APTT: aPTT: 35 seconds (ref 24–37)

## 2011-10-01 LAB — POCT I-STAT TROPONIN I: Troponin i, poc: 0 ng/mL (ref 0.00–0.08)

## 2011-10-01 LAB — PROTIME-INR: Prothrombin Time: 13.4 seconds (ref 11.6–15.2)

## 2011-10-01 MED ORDER — ASPIRIN EC 325 MG PO TBEC
325.0000 mg | DELAYED_RELEASE_TABLET | Freq: Every day | ORAL | Status: DC
  Administered 2011-10-02: 325 mg via ORAL
  Filled 2011-10-01: qty 1

## 2011-10-01 MED ORDER — PANTOPRAZOLE SODIUM 40 MG PO TBEC
40.0000 mg | DELAYED_RELEASE_TABLET | Freq: Every day | ORAL | Status: DC
  Administered 2011-10-01: 40 mg via ORAL
  Filled 2011-10-01: qty 1

## 2011-10-01 MED ORDER — ASPIRIN 81 MG PO CHEW
324.0000 mg | CHEWABLE_TABLET | Freq: Once | ORAL | Status: AC
  Administered 2011-10-01: 324 mg via ORAL
  Filled 2011-10-01: qty 4

## 2011-10-01 MED ORDER — INSULIN GLARGINE 100 UNIT/ML ~~LOC~~ SOLN
10.0000 [IU] | Freq: Every day | SUBCUTANEOUS | Status: DC
  Administered 2011-10-01: 10 [IU] via SUBCUTANEOUS
  Filled 2011-10-01: qty 3

## 2011-10-01 MED ORDER — ONDANSETRON HCL 4 MG/2ML IJ SOLN: 4.0000 mg | Freq: Four times a day (QID) | INTRAMUSCULAR | Status: DC | PRN

## 2011-10-01 MED ORDER — SODIUM CHLORIDE 0.9 % IV SOLN
INTRAVENOUS | Status: DC
  Administered 2011-10-01: 11:00:00 via INTRAVENOUS

## 2011-10-01 MED ORDER — INSULIN ASPART 100 UNIT/ML ~~LOC~~ SOLN
0.0000 [IU] | Freq: Three times a day (TID) | SUBCUTANEOUS | Status: DC
  Administered 2011-10-02: 5 [IU] via SUBCUTANEOUS
  Filled 2011-10-01: qty 3

## 2011-10-01 MED ORDER — ACETAMINOPHEN 325 MG PO TABS: 650.0000 mg | ORAL_TABLET | Freq: Four times a day (QID) | ORAL | Status: DC | PRN

## 2011-10-01 MED ORDER — MORPHINE SULFATE 2 MG/ML IJ SOLN: 1.0000 mg | INTRAMUSCULAR | Status: DC | PRN

## 2011-10-01 MED ORDER — CLONAZEPAM 1 MG PO TABS
1.0000 mg | ORAL_TABLET | Freq: Every evening | ORAL | Status: DC | PRN
  Administered 2011-10-01: 1 mg via ORAL
  Filled 2011-10-01: qty 1

## 2011-10-01 MED ORDER — ACETAMINOPHEN 650 MG RE SUPP: 650.0000 mg | Freq: Four times a day (QID) | RECTAL | Status: DC | PRN

## 2011-10-01 MED ORDER — HYDROCODONE-ACETAMINOPHEN 5-325 MG PO TABS
1.0000 | ORAL_TABLET | Freq: Four times a day (QID) | ORAL | Status: DC | PRN
  Administered 2011-10-01: 1 via ORAL
  Filled 2011-10-01: qty 1

## 2011-10-01 MED ORDER — FERROUS SULFATE 325 (65 FE) MG PO TABS
325.0000 mg | ORAL_TABLET | Freq: Every day | ORAL | Status: DC
  Administered 2011-10-02: 325 mg via ORAL
  Filled 2011-10-01 (×2): qty 1

## 2011-10-01 MED ORDER — ENOXAPARIN SODIUM 30 MG/0.3ML ~~LOC~~ SOLN
30.0000 mg | SUBCUTANEOUS | Status: DC
  Administered 2011-10-01: 30 mg via SUBCUTANEOUS
  Filled 2011-10-01 (×2): qty 0.3

## 2011-10-01 MED ORDER — ONDANSETRON HCL 4 MG PO TABS: 4.0000 mg | ORAL_TABLET | Freq: Four times a day (QID) | ORAL | Status: DC | PRN

## 2011-10-01 MED ORDER — IOHEXOL 300 MG/ML  SOLN
80.0000 mL | Freq: Once | INTRAMUSCULAR | Status: AC | PRN
  Administered 2011-10-01: 80 mL via INTRAVENOUS

## 2011-10-01 MED ORDER — SIMVASTATIN 40 MG PO TABS
40.0000 mg | ORAL_TABLET | Freq: Every day | ORAL | Status: DC
  Administered 2011-10-01: 40 mg via ORAL
  Filled 2011-10-01 (×2): qty 1

## 2011-10-01 MED ORDER — ALUM & MAG HYDROXIDE-SIMETH 200-200-20 MG/5ML PO SUSP: 30.0000 mL | Freq: Four times a day (QID) | ORAL | Status: DC | PRN

## 2011-10-01 NOTE — H&P (Signed)
Patient's PCP: Michiel Sites, MD  Chief Complaint: Chest pain or shortness of breath  History of Present Illness: Bonnie [name] is a 55 year old African American female with history of hypercholesterolemia, diabetes, GERD, anxiety, anemia, and obesity presents with the above complaints.  She reports that over the last few years she has had intermittent episodes of chest pain.  She cannot recall when her last episode of chest pain prior to yesterday was.  Yesterday night after taking her bath at 9 p.m. she went to her bed to lay down for sleep and noted pain in her back shoulder blades.  She also had shortness of breath with this episode.  She took medication for her mid shoulder pain which did not improve the pain.  She then tried a heating pad however she noted that her mid back shoulder pain improved but had left breast and pain over her anterior chest.  She denies any radiating pain to her left arm but did complain of some pounding pain in her left fingers.  She also noted pain in her left medial ankle.  She was diaphoretic during this episode.  Eventually after an hour her pain improved and she went to bed.  She woke up this morning did not have chest pain or shortness of breath but presented to the ER given what happened to her last night.  She denies any recent fevers, chills, vomiting.  Did complain of nausea yesterday.  Did complain of dry cough yesterday.  Currently did not have any cough or shortness of breath during my interview with the patient.  Denies any abdominal pain.  Denies any diarrhea, notes her bowels were regular.  Denies any headaches or vision changes.  Meds: Scheduled Meds:   . aspirin  324 mg Oral Once  . enoxaparin  30 mg Subcutaneous Q24H  . ferrous sulfate  325 mg Oral Q breakfast  . insulin glargine  10 Units Subcutaneous QHS  . pantoprazole  40 mg Oral Q1200  . simvastatin  40 mg Oral QHS   Continuous Infusions:  PRN Meds:.acetaminophen, acetaminophen, alum & mag  hydroxide-simeth, clonazePAM, HYDROcodone-acetaminophen, iohexol, morphine, ondansetron (ZOFRAN) IV, ondansetron Allergies: Review of patient's allergies indicates no known allergies. Past Medical History  Diagnosis Date  . Hypercholesteremia   . Diabetes mellitus   . Anemia   . Obesity   . Anxiety   . GERD (gastroesophageal reflux disease)    Past Surgical History  Procedure Date  . Brain surgery   . 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 Brain surgery     For meningioma.   Family History  Problem Relation Age of Onset  . Diabetes Mother   . Lung cancer Father    History   Social History  . Marital Status: Married    Spouse Name: N/A    Number of Children: N/A  . Years of Education: N/A   Occupational History  . Not on file.   Social History Main Topics  . Smoking status: Former Games developer  . Smokeless tobacco: Not on file  . Alcohol Use: Yes     2-3 times a year.  . Drug Use: No  . Sexually Active:    Other Topics Concern  . Not on file   Social History Narrative  . No narrative on file   Review of Systems: All systems reviewed with the patient and positive as per history of present illness, otherwise all other systems are negative.  Physical Exam: Blood pressure 160/77,  pulse 92, temperature 98.1 F (36.7 C), temperature source Oral, resp. rate 16, SpO2 100.00%. General: Awake, Oriented x3, No acute distress. HEENT: EOMI, Moist mucous membranes Neck: Supple CV: S1 and S2 Lungs: Clear to ascultation bilaterally Abdomen: Soft, Nontender, Nondistended, +bowel sounds. Ext: Good pulses. Trace edema. No clubbing or cyanosis noted. Neuro: Cranial Nerves II-XII grossly intact. Has 5/5 motor strength in upper and lower extremities. Chest: Tenderness to palpation on her chest bilaterally.  Lab results:  [facility] 10/01/11 1242  NA 138  K 3.8  CL 100  CO2 25  GLUCOSE 203*  BUN 8  CREATININE 0.55  CALCIUM 9.2  MG --  PHOS --   No results found for this  basename: AST:2,ALT:2,ALKPHOS:2,BILITOT:2,PROT:2,ALBUMIN:2 in the last 72 hours No results found for this basename: LIPASE:2,AMYLASE:2 in the last 72 hours  Basename 10/01/11 1242  WBC 7.1  NEUTROABS 3.9  HGB 11.6*  HCT 35.2*  MCV 74.4*  PLT 250   No results found for this basename: CKTOTAL:3,CKMB:3,CKMBINDEX:3,TROPONINI:3 in the last 72 hours No results found for this basename: POCBNP:3 in the last 72 hours No results found for this basename: DDIMER in the last 72 hours No results found for this basename: HGBA1C:2 in the last 72 hours No results found for this basename: CHOL:2,HDL:2,LDLCALC:2,TRIG:2,CHOLHDL:2,LDLDIRECT:2 in the last 72 hours No results found for this basename: TSH,T4TOTAL,FREET3,T3FREE,THYROIDAB in the last 72 hours No results found for this basename: VITAMINB12:2,FOLATE:2,FERRITIN:2,TIBC:2,IRON:2,RETICCTPCT:2 in the last 72 hours Imaging results:  Dg Chest 2 View  10/01/2011  *RADIOLOGY REPORT*  Clinical Data: Shortness of breath  CHEST - 2 VIEW  Comparison: 07/07/2011  Findings: Radiopaque left breast biopsy marker noted.  Heart size is normal.  The lungs are clear.  No pleural effusion.  No acute osseous finding.  IMPRESSION: No acute cardiopulmonary process.  Original Report Authenticated By: Harrel Lemon, M.D.   Ct Angio Chest W/cm &/or Wo Cm  10/01/2011  *RADIOLOGY REPORT*  Clinical Data:  Chest pain, shortness of breath, evaluate for PE  CT ANGIOGRAPHY CHEST WITH CONTRAST  Technique:  Multidetector CT imaging of the chest was performed using the standard protocol during bolus administration of intravenous contrast.  Multiplanar CT image reconstructions including MIPs were obtained to evaluate the vascular anatomy.  Contrast: 80mL OMNIPAQUE IOHEXOL 300 MG/ML IV SOLN  Comparison:  Chest radiographs dated 10/01/2011.  CT chest dated 04/04/2011.  Findings:  No evidence of pulmonary embolism.  Mild dependent atelectasis in the bilateral lower lobes.  No pleural  effusion or pneumothorax.  Visualized thyroid is unremarkable.  The heart is normal in size.  No pericardial effusion.  No suspicious mediastinal, hilar, or axillary lymphadenopathy.  Surgical clip in the left breast (series 5/image 53).  Visualized upper abdomen is unremarkable.  Mild degenerative changes of the visualized thoracolumbar spine.  Review of the MIP images confirms the above findings.  IMPRESSION: No evidence of pulmonary embolism.  No evidence of acute cardiopulmonary disease.  Original Report Authenticated By: Charline Bills, M.D.   Other results: EKG: normal EKG, normal sinus rhythm, unchanged from previous tracings.  Assessment & Plan by Problem: 1. Chest pain, atypical - Etiology unclear at this time.  Will admit the patient and will rule the patient for acute coronary syndrome.  CT angiogram was negative for pulmonary embolism.  Given the patient is having chest pain on palpation suspect the patient may have musculoskeletal pain versus costochondritis.  Patient reports that she has had a 2-D echocardiogram done at her primary care physician's office in the  summer of this year which per patient was normal.  Records are unavailable for my review.  Will admit the patient to telemetry, and trend troponins.  If ruled out for acute coronary syndrome patient to follow up with her primary care physician for further evaluation and for consideration of outpatient stress test.   2. Shortness of breath - Etiology unclear at this time.  Uncertain if anxiety is playing a part in her shortness of breath.  CT angiogram was negative for pulmonary embolism.  No evidence of any acute cardiopulmonary disease.  Will not start the patient on any antibiotics as she was not coughing during my interview with the patient to suggest bronchitis.  3. Hypercholesteremia - Continue simvastatin.  4. Anemia - patient has a history of iron deficiency anemia.  Continue ferrous sulfate.  5. Obesity - diet and  exercise as outpatient.  6. Anxiety - continue home dose of Ativan.  7. GERD (gastroesophageal reflux disease) - continue PPI.  Uncertain if patient's GERD is causing chest pain.  Patient reports that Dr. Evette Cristal has performed the EGD early this year and has increased her PPI.  Reports that she has had swallowing evaluation done as per gastroenterology.  8. DM type 2 (diabetes mellitus, type 2).  Continue patient's Lantus.  Hold patient's Victoza.  Will check a lipase in the morning as Victoza is known to cause pancreatitis.  Hold metformin.  Sliding scale insulin.  9.  Prophylaxis.  Lovenox and PPI.  10.  CODE STATUS full code.   Time spent on admission, talking to the patient, and coordinating care was: 60 mins.  Valina Maes A, MD 10/01/2011, 5:32 PM

## 2011-10-01 NOTE — ED Notes (Signed)
MD at bedside. 

## 2011-10-01 NOTE — ED Notes (Signed)
Patient C/O pain in her left chest that began last night. States that the pain went from her back to the front of her chest. Also C/O pain going down to her left foot.  States that it was painful to breathe or move. States that she reminded still until the pain went away. When her chest pain went away, her foot pain went away.

## 2011-10-01 NOTE — ED Notes (Signed)
Per Dr. Artis Flock, call placed to [facility] - transport truck requested for transfer to [facility] ED. Per Duwayne Heck at same, truck being dispatched at this time.

## 2011-10-01 NOTE — ED Notes (Signed)
Patient had a sudden onset of chest pain last night at 9 PM. States that it lasted until 1 AM then it resolved.

## 2011-10-01 NOTE — ED Provider Notes (Signed)
History     CSN: [id number] Arrival date & time: 10/01/2011  9:33 AM   First MD Initiated Contact with Patient 10/01/11 [phone]      Chief Complaint  Patient presents with  . Chest Pain    chest pain    (Consider location/radiation/quality/duration/timing/severity/associated sxs/prior treatment) HPI Comments: She had an episode of chest pain last night that began around 9 PM and lasted until 1 AM. It began in the back, was sharp, radiating around to the anterior chest into the neck. The pain was severe, rated 10 over 10 in intensity, now it's just a 1. The pain was pleuritic and worse with movement and with deep inspiration and she felt short of breath, dry cough, felt feverish, chilled, had a sweats, and felt nauseated. She did have some pain in both of her legs. She has no prior history of similar pain, cardiac history, blood clots, or pulmonary emboli.  Patient is a 55 y.o. female presenting with chest pain.  Chest Pain Primary symptoms include a fever and shortness of breath. Pertinent negatives for primary symptoms include no cough, no wheezing, no palpitations, no abdominal pain, no nausea and no vomiting.  Associated symptoms include diaphoresis.     Past Medical History  Diagnosis Date  . Hypercholesteremia   . Diabetes mellitus     Past Surgical History  Procedure Date  . Brain surgery   . Back surgery     History reviewed. No pertinent family history.  History  Substance Use Topics  . Smoking status: Never Smoker   . Smokeless tobacco: Not on file  . Alcohol Use: Yes     occa    OB History    Grav Para Term Preterm Abortions TAB SAB Ect Mult Living                  Review of Systems  Constitutional: Positive for fever, chills and diaphoresis.  Respiratory: Positive for shortness of breath. Negative for cough and wheezing.   Cardiovascular: Positive for chest pain. Negative for palpitations and leg swelling.  Gastrointestinal: Negative for nausea,  vomiting and abdominal pain.    Allergies  Review of patient's allergies indicates no known allergies.  Home Medications   Current Outpatient Rx  Name Route Sig Dispense Refill  . CLONAZEPAM 1 MG PO TABS Oral Take 1 mg by mouth at bedtime as needed.      . DEXLANSOPRAZOLE 60 MG PO CPDR Oral Take 60 mg by mouth 2 (two) times daily.      Marland Kitchen HYDROCODONE-ACETAMINOPHEN 5-325 MG PO TABS Oral Take 1 tablet by mouth every 6 (six) hours as needed.      Marland Kitchen LANTUS [place] Subcutaneous Inject 10 Units into the skin once.      . IRON 66 MG PO TABS Oral Take 65 tablets by mouth.      Marland Kitchen VICTOZA [place] Subcutaneous Inject 1.2 Units into the skin once.      . METFORMIN HCL 1000 MG PO TABS Oral Take by mouth 2 (two) times daily with a meal.      . SIMVASTATIN 40 MG PO TABS Oral Take 40 mg by mouth at bedtime.      Marland Kitchen VALACYCLOVIR HCL 1 G PO TABS Oral Take 1,000 mg by mouth 1 day or 1 dose.        BP 143/88  Pulse 100  Temp(Src) 98.6 F (37 C) (Oral)  Resp 18  SpO2 99%  Physical Exam  Nursing note and vitals reviewed.  Constitutional: She appears well-developed and well-nourished. No distress.  Cardiovascular: Normal rate, regular rhythm, S1 normal, S2 normal, intact distal pulses and normal pulses.   No extrasystoles are present. PMI is not displaced.  Exam reveals no gallop, no S3, no S4, no distant heart sounds and no friction rub.   No murmur heard. Pulmonary/Chest: Effort normal and breath sounds normal. No respiratory distress. She has no wheezes. She has no rales. She exhibits no tenderness.  Abdominal: Soft. Bowel sounds are normal. She exhibits no distension and no mass. There is no tenderness. There is no rebound and no guarding.  Skin: Skin is warm and dry. No rash noted. She is not diaphoretic.    ED Course  Procedures (including critical care time)  Labs Reviewed - No data to display Dg Chest 2 View  10/01/2011  *RADIOLOGY REPORT*  Clinical Data: Shortness of breath  CHEST - 2 VIEW   Comparison: 07/07/2011  Findings: Radiopaque left breast biopsy marker noted.  Heart size is normal.  The lungs are clear.  No pleural effusion.  No acute osseous finding.  IMPRESSION: No acute cardiopulmonary process.  Original Report Authenticated By: Harrel Lemon, M.D.     Date: 10/01/2011  Rate: 92  Rhythm: normal sinus rhythm  QRS Axis: normal  Intervals: normal  ST/T Wave abnormalities: normal  Conduction Disutrbances:none  Narrative Interpretation:   Old EKG Reviewed: none available    1. Pleuritic chest pain       MDM  She has pleuritic chest pain with abnormal EKG and chest x-ray. I think pulmonary embolus needs to be ruled out and we are transferring her to the emergency department via [facility].        Roque Lias, MD 10/01/11 [phone]

## 2011-10-01 NOTE — ED Provider Notes (Signed)
History     CSN: [id number] Arrival date & time: 10/01/2011 12:04 PM   First MD Initiated Contact with Patient 10/01/11 1206      Chief Complaint  Patient presents with  . Chest Pain    (Consider location/radiation/quality/duration/timing/severity/associated sxs/prior treatment) Patient is a 55 y.o. female presenting with chest pain. The history is provided by the patient. No language interpreter was used.  Chest Pain The chest pain began yesterday. Chest pain occurs frequently. The chest pain is resolved. The pain is associated with breathing and coughing. The severity of the pain is moderate. The quality of the pain is described as aching and pleuritic. The pain does not radiate. Chest pain is worsened by deep breathing. Primary symptoms include shortness of breath. Pertinent negatives for primary symptoms include no fever, no fatigue, no cough, no palpitations, no abdominal pain, no nausea, no vomiting and no dizziness.  The shortness of breath began yesterday. The shortness of breath developed gradually. The shortness of breath is moderate. The patient's medical history does not include CHF or COPD.  Pertinent negatives for associated symptoms include no diaphoresis, no lower extremity edema, no near-syncope, no numbness and no weakness. She tried nothing for the symptoms. Risk factors include obesity.  Her past medical history is significant for diabetes, hyperlipidemia and hypertension.     Past Medical History  Diagnosis Date  . Hypercholesteremia   . Diabetes mellitus     Past Surgical History  Procedure Date  . Brain surgery   . Back surgery     History reviewed. No pertinent family history.  History  Substance Use Topics  . Smoking status: Never Smoker   . Smokeless tobacco: Not on file  . Alcohol Use: Yes     occa    OB History    Grav Para Term Preterm Abortions TAB SAB Ect Mult Living                  Review of Systems  Constitutional: Negative for  fever, diaphoresis, activity change, appetite change and fatigue.  HENT: Negative for congestion, sore throat, rhinorrhea, neck pain and neck stiffness.   Respiratory: Positive for shortness of breath. Negative for cough and chest tightness.   Cardiovascular: Positive for chest pain. Negative for palpitations and near-syncope.  Gastrointestinal: Negative for nausea, vomiting and abdominal pain.  Genitourinary: Negative for dysuria, urgency, frequency and flank pain.  Neurological: Negative for dizziness, weakness, light-headedness, numbness and headaches.  All other systems reviewed and are negative.    Allergies  Review of patient's allergies indicates no known allergies.  Home Medications   Current Outpatient Rx  Name Route Sig Dispense Refill  . CLONAZEPAM 1 MG PO TABS Oral Take 1 mg by mouth at bedtime as needed. For sleep    . DEXLANSOPRAZOLE 60 MG PO CPDR Oral Take 60 mg by mouth 2 (two) times daily.      Marland Kitchen FERROUS SULFATE 325 (65 FE) MG PO TABS Oral Take 325 mg by mouth daily with breakfast.      . HYDROCODONE-ACETAMINOPHEN 5-325 MG PO TABS Oral Take 1 tablet by mouth every 6 (six) hours as needed. For pain    . LANTUS [place] Subcutaneous Inject 10 Units into the skin once.      Marland Kitchen VICTOZA  Subcutaneous Inject 1.2 Units into the skin once.      . METFORMIN HCL 1000 MG PO TABS Oral Take by mouth 2 (two) times daily with a meal.      .  SIMVASTATIN 40 MG PO TABS Oral Take 40 mg by mouth at bedtime.      Marland Kitchen VALACYCLOVIR HCL 1 G PO TABS Oral Take 1,000 mg by mouth daily as needed. For outbreak      BP 147/69  Pulse 85  Temp(Src) 98.1 F (36.7 C) (Oral)  Resp 16  SpO2 97%  Physical Exam  Nursing note and vitals reviewed. Constitutional: She is oriented to person, place, and time. She appears well-developed and well-nourished. No distress.  HENT:  Head: Normocephalic and atraumatic.  Mouth/Throat: Oropharynx is clear and moist.  Eyes: Conjunctivae and EOM are normal. Pupils are  equal, round, and reactive to light.  Neck: Normal range of motion. Neck supple.  Cardiovascular: Normal rate, regular rhythm, normal heart sounds and intact distal pulses.  Exam reveals no gallop and no friction rub.   No murmur heard. Pulmonary/Chest: Effort normal and breath sounds normal. No respiratory distress.  Abdominal: Soft. Bowel sounds are normal. There is no tenderness.  Musculoskeletal: Normal range of motion. She exhibits no tenderness.  Neurological: She is alert and oriented to person, place, and time.  Skin: Skin is warm and dry.    ED Course  Procedures (including critical care time)   Date: 10/01/2011  Rate: 85  Rhythm: normal sinus rhythm  QRS Axis: normal  Intervals: normal  ST/T Wave abnormalities: normal  Conduction Disutrbances:none  Narrative Interpretation:   Old EKG Reviewed: unchanged  Labs Reviewed  CBC - Abnormal; Notable for the following:    Hemoglobin 11.6 (*)    HCT 35.2 (*)    MCV 74.4 (*)    MCH 24.5 (*)    RDW 16.2 (*)    All other components within normal limits  BASIC METABOLIC PANEL - Abnormal; Notable for the following:    Glucose, Bld 203 (*)    All other components within normal limits  DIFFERENTIAL  URINALYSIS, ROUTINE W REFLEX MICROSCOPIC  PROTIME-INR  APTT  POCT I-STAT TROPONIN I  I-STAT TROPONIN I   Dg Chest 2 View  10/01/2011  *RADIOLOGY REPORT*  Clinical Data: Shortness of breath  CHEST - 2 VIEW  Comparison: 07/07/2011  Findings: Radiopaque left breast biopsy marker noted.  Heart size is normal.  The lungs are clear.  No pleural effusion.  No acute osseous finding.  IMPRESSION: No acute cardiopulmonary process.  Original Report Authenticated By: Harrel Lemon, M.D.   Ct Angio Chest W/cm &/or Wo Cm  10/01/2011  *RADIOLOGY REPORT*  Clinical Data:  Chest pain, shortness of breath, evaluate for PE  CT ANGIOGRAPHY CHEST WITH CONTRAST  Technique:  Multidetector CT imaging of the chest was performed using the standard  protocol during bolus administration of intravenous contrast.  Multiplanar CT image reconstructions including MIPs were obtained to evaluate the vascular anatomy.  Contrast: 80mL OMNIPAQUE IOHEXOL 300 MG/ML IV SOLN  Comparison:  Chest radiographs dated 10/01/2011.  CT chest dated 04/04/2011.  Findings:  No evidence of pulmonary embolism.  Mild dependent atelectasis in the bilateral lower lobes.  No pleural effusion or pneumothorax.  Visualized thyroid is unremarkable.  The heart is normal in size.  No pericardial effusion.  No suspicious mediastinal, hilar, or axillary lymphadenopathy.  Surgical clip in the left breast (series 5/image 53).  Visualized upper abdomen is unremarkable.  Mild degenerative changes of the visualized thoracolumbar spine.  Review of the MIP images confirms the above findings.  IMPRESSION: No evidence of pulmonary embolism.  No evidence of acute cardiopulmonary disease.  Original Report Authenticated By:  Charline Bills, M.D.     1. Chest pain, unspecified       MDM  Patient with multiple risk factors for coronary artery disease. Her heart score is greater than 2. Given the pleuritic component and the transfer from her to carry CT angiography chest was performed and negative for PE. She was given aspirin and a cardiac workup was obtained. Initial troponin was negative. Given the heart score greater than 2 she'll be admitted the hospital for further evaluation and treatment. Discussed the case the [facility] hospitalist who accepted the patient to telemetry.        Dayton Bailiff, MD 10/01/11 [phone]

## 2011-10-02 LAB — BASIC METABOLIC PANEL
BUN: 9 mg/dL (ref 6–23)
CO2: 27 mEq/L (ref 19–32)
Calcium: 9.5 mg/dL (ref 8.4–10.5)
Creatinine, Ser: 0.55 mg/dL (ref 0.50–1.10)
Glucose, Bld: 207 mg/dL — ABNORMAL HIGH (ref 70–99)

## 2011-10-02 LAB — CBC
MCH: 23.9 pg — ABNORMAL LOW (ref 26.0–34.0)
MCHC: 32.1 g/dL (ref 30.0–36.0)
MCV: 74.4 fL — ABNORMAL LOW (ref 78.0–100.0)
Platelets: 225 10*3/uL (ref 150–400)
RDW: 16.2 % — ABNORMAL HIGH (ref 11.5–15.5)
WBC: 6.7 10*3/uL (ref 4.0–10.5)

## 2011-10-02 LAB — CARDIAC PANEL(CRET KIN+CKTOT+MB+TROPI)
Relative Index: 0.9 (ref 0.0–2.5)
Total CK: 164 U/L (ref 7–177)

## 2011-10-02 LAB — GLUCOSE, CAPILLARY: Glucose-Capillary: 214 mg/dL — ABNORMAL HIGH (ref 70–99)

## 2011-10-02 NOTE — Progress Notes (Signed)
Subjective: Denies any chest pain.  Denies any shortness of breath.  Feeling better today.  Objective: Vital signs in last 24 hours: Filed Vitals:   10/01/11 1642 10/01/11 1737 10/01/11 2129 10/02/11 0501  BP: 160/77 154/76 157/81 145/85  Pulse: 92 93 90 95  Temp: 98.1 F (36.7 C) 98.1 F (36.7 C) 98 F (36.7 C) 98 F (36.7 C)  TempSrc: Oral Oral Oral Oral  Resp:  20 20 18   Weight:    95.528 kg (210 lb 9.6 oz)  SpO2: 100% 98% 97% 98%   Weight change:  No intake or output data in the 24 hours ending 10/02/11 0926 Physical Exam: General: Awake, Oriented, No acute distress. HEENT: EOMI. Neck: Supple CV: S1 and S2 Lungs: Clear to ascultation bilaterally Abdomen: Soft, Nontender, Nondistended, +bowel sounds. Ext: Good pulses. Trace edema.  Lab Results:  Basename 10/02/11 0622 10/01/11 1842 10/01/11 1242  NA 138 -- 138  K 3.7 -- 3.8  CL 101 -- 100  CO2 27 -- 25  GLUCOSE 207* -- 203*  BUN 9 -- 8  CREATININE 0.55 0.51 --  CALCIUM 9.5 -- 9.2  MG -- -- --  PHOS -- -- --   No results found for this basename: AST:2,ALT:2,ALKPHOS:2,BILITOT:2,PROT:2,ALBUMIN:2 in the last 72 hours  Basename 10/02/11 0622  LIPASE 54  AMYLASE --    Basename 10/02/11 0622 10/01/11 1842 10/01/11 1242  WBC 6.7 7.6 --  NEUTROABS -- -- 3.9  HGB 11.4* 11.5* --  HCT 35.5* 35.0* --  MCV 74.4* 73.7* --  PLT 225 227 --    Basename 10/02/11 0041  CKTOTAL 164  CKMB 1.5  CKMBINDEX --  TROPONINI <0.30   No results found for this basename: POCBNP:3 in the last 72 hours No results found for this basename: DDIMER:2 in the last 72 hours No results found for this basename: HGBA1C:2 in the last 72 hours No results found for this basename: CHOL:2,HDL:2,LDLCALC:2,TRIG:2,CHOLHDL:2,LDLDIRECT:2 in the last 72 hours No results found for this basename: TSH,T4TOTAL,FREET3,T3FREE,THYROIDAB in the last 72 hours No results found for this basename: VITAMINB12:2,FOLATE:2,FERRITIN:2,TIBC:2,IRON:2,RETICCTPCT:2 in  the last 72 hours  Micro Results: No results found for this or any previous visit (from the past 240 hour(s)).  Studies/Results: Dg Chest 2 View  10/01/2011  *RADIOLOGY REPORT*  Clinical Data: Shortness of breath  CHEST - 2 VIEW  Comparison: 07/07/2011  Findings: Radiopaque left breast biopsy marker noted.  Heart size is normal.  The lungs are clear.  No pleural effusion.  No acute osseous finding.  IMPRESSION: No acute cardiopulmonary process.  Original Report Authenticated By: Harrel Lemon, M.D.   Ct Angio Chest W/cm &/or Wo Cm  10/01/2011  *RADIOLOGY REPORT*  Clinical Data:  Chest pain, shortness of breath, evaluate for PE  CT ANGIOGRAPHY CHEST WITH CONTRAST  Technique:  Multidetector CT imaging of the chest was performed using the standard protocol during bolus administration of intravenous contrast.  Multiplanar CT image reconstructions including MIPs were obtained to evaluate the vascular anatomy.  Contrast: 80mL OMNIPAQUE IOHEXOL 300 MG/ML IV SOLN  Comparison:  Chest radiographs dated 10/01/2011.  CT chest dated 04/04/2011.  Findings:  No evidence of pulmonary embolism.  Mild dependent atelectasis in the bilateral lower lobes.  No pleural effusion or pneumothorax.  Visualized thyroid is unremarkable.  The heart is normal in size.  No pericardial effusion.  No suspicious mediastinal, hilar, or axillary lymphadenopathy.  Surgical clip in the left breast (series 5/image 53).  Visualized upper abdomen is unremarkable.  Mild degenerative changes  of the visualized thoracolumbar spine.  Review of the MIP images confirms the above findings.  IMPRESSION: No evidence of pulmonary embolism.  No evidence of acute cardiopulmonary disease.  Original Report Authenticated By: Charline Bills, M.D.    Medications: I have reviewed the patient's current medications. Scheduled Meds:   . aspirin  324 mg Oral Once  . aspirin EC  325 mg Oral Daily  . enoxaparin  30 mg Subcutaneous Q24H  . ferrous sulfate   325 mg Oral Q breakfast  . insulin aspart  0-15 Units Subcutaneous TID WC  . insulin glargine  10 Units Subcutaneous QHS  . pantoprazole  40 mg Oral Q1200  . simvastatin  40 mg Oral QHS   Continuous Infusions:  PRN Meds:.acetaminophen, acetaminophen, alum & mag hydroxide-simeth, clonazePAM, HYDROcodone-acetaminophen, iohexol, morphine, ondansetron (ZOFRAN) IV, ondansetron  Assessment/Plan: 1. Chest pain, atypical - Troponin stent x2 patient ruled out for acute coronary syndrome. Follow up with her primary care physician for further evaluation and for consideration of outpatient stress test.  Uncertain if patient's anxiety and home stressors are triggering these episodes.  2. Shortness of breath - Resolved.  3. Hypercholesteremia - Continue simvastatin.   4. Anemia - Patient has a history of iron deficiency anemia. Continue ferrous sulfate.  Hemoglobin stable.  5. Obesity - diet and exercise as outpatient.   6. Anxiety - continue home dose of Ativan.   7. GERD (gastroesophageal reflux disease) - continue PPI.   8. DM type 2 (diabetes mellitus, type 2). Continue patient's Lantus. Hold patient's Victoza.  Lipase normal.  Continue sliding scale insulin.   9. Prophylaxis. Lovenox and PPI.   10. CODE STATUS full code.  11.  Disposition.  Discharge home today.   LOS: 1 day  Dagmar Adcox A, MD 10/02/2011, 9:26 AM

## 2011-10-02 NOTE — Discharge Summary (Signed)
Discharge Summary  Bonnie [name] MR#: [id number]  DOB:Feb 12, 1956  Date of Admission: 10/01/2011 Date of Discharge: 10/02/2011  Patient's PCP: Michiel Sites, MD  Attending Physician:Delaine Canter A  Consults: None.  Discharge Diagnoses: Principal Problem:  *Chest pain, atypical Active Problems:  Shortness of breath  Hypercholesteremia  Anemia  Obesity  Anxiety  GERD (gastroesophageal reflux disease)  DM type 2 (diabetes mellitus, type 2)   Brief Admitting History and Physical "Bonnie [name] is a 55 year old African American female with history of hypercholesterolemia, diabetes, GERD, anxiety, anemia, and obesity presents with the above complaints. She reports that over the last few years she has had intermittent episodes of chest pain. She cannot recall when her last episode of chest pain prior to yesterday was. Yesterday night after taking her bath at 9 p.m. she went to her bed to lay down for sleep and noted pain in her back shoulder blades. She also had shortness of breath with this episode. She took medication for her mid shoulder pain which did not improve the pain. She then tried a heating pad however she noted that her mid back shoulder pain improved but had left breast and pain over her anterior chest. She denies any radiating pain to her left arm but did complain of some pounding pain in her left fingers. She also noted pain in her left medial ankle. She was diaphoretic during this episode. Eventually after an hour her pain improved and she went to bed. She woke up this morning did not have chest pain or shortness of breath but presented to the ER given what happened to her last night. She denies any recent fevers, chills, vomiting. Did complain of nausea yesterday. Did complain of dry cough yesterday. Currently did not have any cough or shortness of breath during my interview with the patient. Denies any abdominal pain. Denies any diarrhea, notes her bowels were regular. Denies any  headaches or vision changes."  Discharge Medications Current Discharge Medication List    CONTINUE these medications which have NOT CHANGED   Details  clonazePAM (KLONOPIN) 1 MG tablet Take 1 mg by mouth at bedtime as needed. For sleep    dexlansoprazole (DEXILANT) 60 MG capsule Take 60 mg by mouth 2 (two) times daily.      ferrous sulfate 325 (65 FE) MG tablet Take 325 mg by mouth daily with breakfast.      HYDROcodone-acetaminophen (NORCO) 5-325 MG per tablet Take 1 tablet by mouth every 6 (six) hours as needed. For pain    Insulin Glargine (LANTUS [place]) Inject 10 Units into the skin once.      Liraglutide (VICTOZA [place]) Inject 1.2 Units into the skin once.      metFORMIN (GLUCOPHAGE) 1000 MG tablet Take by mouth 2 (two) times daily with a meal.      simvastatin (ZOCOR) 40 MG tablet Take 40 mg by mouth at bedtime.      valACYclovir (VALTREX) 1000 MG tablet Take 1,000 mg by mouth daily as needed. For outbreak      STOP taking these medications     Iron 66 MG TABS Comments:  Reason for Stopping:          Hospital Course: 1. Chest pain, atypical - Patient was admitted and was ruled out for acute coronary syndrome. CT angiogram was negative for pulmonary embolism. Given the patient is having chest pain on palpation suspect the patient may have musculoskeletal pain versus costochondritis. Patient reports that she has had a 2-D echocardiogram done at her  primary care physician's office in the summer of this year which per patient was normal. Records are unavailable for my review.  As the patient is ruled out for acute coronary syndrome, patient was instructed to follow up with her primary care physician for consideration of stress test to be done as an outpatient.  Uncertain if patient has a component of anxiety or home family stressors which may be triggering her chest pain.  2. Shortness of breath - Etiology unclear at this time. Uncertain if anxiety is playing a part in her shortness  of breath. CT angiogram was negative for pulmonary embolism. No evidence of any acute cardiopulmonary disease.  Antibiotics are started because the patient was not having any cough or any sore throat to suggest possible bronchitis.  Resolved during the hospital stay.  3. Hypercholesteremia - Continue simvastatin.   4. Anemia - patient has a history of iron deficiency anemia. Continue ferrous sulfate.   5. Obesity - diet and exercise as outpatient.   6. Anxiety - continue home dose of Ativan.   7. GERD (gastroesophageal reflux disease) - continue PPI. Uncertain if patient's GERD is causing chest pain. Patient reports that Dr. Evette Cristal has performed the EGD early this year and has increased her PPI. Reports that she has had swallowing evaluation done as per gastroenterology.   8. DM type 2 (diabetes mellitus, type 2). Continue patient's Lantus. Held patient's Victoza and metformin during the course of the hospital stay but resumed at discharge. Lipase was checked as the patient was on Victoza which is known to cause pancreatitis and lipase was normal.   Day of Discharge BP 145/85  Pulse 95  Temp(Src) 98 F (36.7 C) (Oral)  Resp 18  Wt 95.528 kg (210 lb 9.6 oz)  SpO2 98%  Results for orders placed during the hospital encounter of 10/01/11 (from the past 48 hour(s))  CBC     Status: Abnormal   Collection Time   10/01/11 12:42 PM      Component Value Range Comment   WBC 7.1  4.0 - 10.5 (K/uL)    RBC 4.73  3.87 - 5.11 (MIL/uL)    Hemoglobin 11.6 (*) 12.0 - 15.0 (g/dL)    HCT 16.1 (*) 09.6 - 46.0 (%)    MCV 74.4 (*) 78.0 - 100.0 (fL)    MCH 24.5 (*) 26.0 - 34.0 (pg)    MCHC 33.0  30.0 - 36.0 (g/dL)    RDW 04.5 (*) 40.9 - 15.5 (%)    Platelets 250  150 - 400 (K/uL)   DIFFERENTIAL     Status: Normal   Collection Time   10/01/11 12:42 PM      Component Value Range Comment   Neutrophils Relative 54  43 - 77 (%)    Neutro Abs 3.9  1.7 - 7.7 (K/uL)    Lymphocytes Relative 36  12 - 46 (%)     Lymphs Abs 2.5  0.7 - 4.0 (K/uL)    Monocytes Relative 8  3 - 12 (%)    Monocytes Absolute 0.5  0.1 - 1.0 (K/uL)    Eosinophils Relative 2  0 - 5 (%)    Eosinophils Absolute 0.2  0.0 - 0.7 (K/uL)    Basophils Relative 0  0 - 1 (%)    Basophils Absolute 0.0  0.0 - 0.1 (K/uL)   BASIC METABOLIC PANEL     Status: Abnormal   Collection Time   10/01/11 12:42 PM      Component Value  Range Comment   Sodium 138  135 - 145 (mEq/L)    Potassium 3.8  3.5 - 5.1 (mEq/L)    Chloride 100  96 - 112 (mEq/L)    CO2 25  19 - 32 (mEq/L)    Glucose, Bld 203 (*) 70 - 99 (mg/dL)    BUN 8  6 - 23 (mg/dL)    Creatinine, Ser 1.61  0.50 - 1.10 (mg/dL)    Calcium 9.2  8.4 - 10.5 (mg/dL)    GFR calc non Af Amer >90  >90 (mL/min)    GFR calc Af Amer >90  >90 (mL/min)   PROTIME-INR     Status: Normal   Collection Time   10/01/11 12:42 PM      Component Value Range Comment   Prothrombin Time 13.4  11.6 - 15.2 (seconds)    INR 1.00  0.00 - 1.49    APTT     Status: Normal   Collection Time   10/01/11 12:42 PM      Component Value Range Comment   aPTT 35  24 - 37 (seconds)   POCT I-STAT TROPONIN I     Status: Normal   Collection Time   10/01/11  1:18 PM      Component Value Range Comment   Troponin i, poc 0.00  0.00 - 0.08 (ng/mL)    Comment 3            URINALYSIS, ROUTINE W REFLEX MICROSCOPIC     Status: Normal   Collection Time   10/01/11  1:27 PM      Component Value Range Comment   Color, Urine YELLOW  YELLOW     Appearance CLEAR  CLEAR     Specific Gravity, Urine 1.012  1.005 - 1.030     pH 5.5  5.0 - 8.0     Glucose, UA NEGATIVE  NEGATIVE (mg/dL)    Hgb urine dipstick NEGATIVE  NEGATIVE     Bilirubin Urine NEGATIVE  NEGATIVE     Ketones, ur NEGATIVE  NEGATIVE (mg/dL)    Protein, ur NEGATIVE  NEGATIVE (mg/dL)    Urobilinogen, UA 0.2  0.0 - 1.0 (mg/dL)    Nitrite NEGATIVE  NEGATIVE     Leukocytes, UA NEGATIVE  NEGATIVE  MICROSCOPIC NOT DONE ON URINES WITH NEGATIVE PROTEIN, BLOOD, LEUKOCYTES,  NITRITE, OR GLUCOSE <1000 mg/dL.  CBC     Status: Abnormal   Collection Time   10/01/11  6:42 PM      Component Value Range Comment   WBC 7.6  4.0 - 10.5 (K/uL)    RBC 4.75  3.87 - 5.11 (MIL/uL)    Hemoglobin 11.5 (*) 12.0 - 15.0 (g/dL)    HCT 09.6 (*) 04.5 - 46.0 (%)    MCV 73.7 (*) 78.0 - 100.0 (fL)    MCH 24.2 (*) 26.0 - 34.0 (pg)    MCHC 32.9  30.0 - 36.0 (g/dL)    RDW 40.9 (*) 81.1 - 15.5 (%)    Platelets 227  150 - 400 (K/uL)   CREATININE, SERUM     Status: Normal   Collection Time   10/01/11  6:42 PM      Component Value Range Comment   Creatinine, Ser 0.51  0.50 - 1.10 (mg/dL)    GFR calc non Af Amer >90  >90 (mL/min)    GFR calc Af Amer >90  >90 (mL/min)   GLUCOSE, CAPILLARY     Status: Abnormal   Collection Time   10/01/11  8:54 PM      Component Value Range Comment   Glucose-Capillary 251 (*) 70 - 99 (mg/dL)   CARDIAC PANEL(CRET KIN+CKTOT+MB+TROPI)     Status: Normal   Collection Time   10/02/11 12:41 AM      Component Value Range Comment   Total CK 164  7 - 177 (U/L) HEMOLYSIS AT THIS LEVEL MAY AFFECT RESULT   CK, MB 1.5  0.3 - 4.0 (ng/mL)    Troponin I <0.30  <0.30 (ng/mL)    Relative Index 0.9  0.0 - 2.5    BASIC METABOLIC PANEL     Status: Abnormal   Collection Time   10/02/11  6:22 AM      Component Value Range Comment   Sodium 138  135 - 145 (mEq/L)    Potassium 3.7  3.5 - 5.1 (mEq/L)    Chloride 101  96 - 112 (mEq/L)    CO2 27  19 - 32 (mEq/L)    Glucose, Bld 207 (*) 70 - 99 (mg/dL)    BUN 9  6 - 23 (mg/dL)    Creatinine, Ser 9.56  0.50 - 1.10 (mg/dL)    Calcium 9.5  8.4 - 10.5 (mg/dL)    GFR calc non Af Amer >90  >90 (mL/min)    GFR calc Af Amer >90  >90 (mL/min)   CBC     Status: Abnormal   Collection Time   10/02/11  6:22 AM      Component Value Range Comment   WBC 6.7  4.0 - 10.5 (K/uL)    RBC 4.77  3.87 - 5.11 (MIL/uL)    Hemoglobin 11.4 (*) 12.0 - 15.0 (g/dL)    HCT 21.3 (*) 08.6 - 46.0 (%)    MCV 74.4 (*) 78.0 - 100.0 (fL)    MCH 23.9  (*) 26.0 - 34.0 (pg)    MCHC 32.1  30.0 - 36.0 (g/dL)    RDW 57.8 (*) 46.9 - 15.5 (%)    Platelets 225  150 - 400 (K/uL)   LIPASE, BLOOD     Status: Normal   Collection Time   10/02/11  6:22 AM      Component Value Range Comment   Lipase 54  11 - 59 (U/L)   GLUCOSE, CAPILLARY     Status: Abnormal   Collection Time   10/02/11  7:51 AM      Component Value Range Comment   Glucose-Capillary 214 (*) 70 - 99 (mg/dL)     Dg Chest 2 View  62/95/2841  *RADIOLOGY REPORT*  Clinical Data: Shortness of breath  CHEST - 2 VIEW  Comparison: 07/07/2011  Findings: Radiopaque left breast biopsy marker noted.  Heart size is normal.  The lungs are clear.  No pleural effusion.  No acute osseous finding.  IMPRESSION: No acute cardiopulmonary process.  Original Report Authenticated By: Harrel Lemon, M.D.   Ct Angio Chest W/cm &/or Wo Cm  10/01/2011  *RADIOLOGY REPORT*  Clinical Data:  Chest pain, shortness of breath, evaluate for PE  CT ANGIOGRAPHY CHEST WITH CONTRAST  Technique:  Multidetector CT imaging of the chest was performed using the standard protocol during bolus administration of intravenous contrast.  Multiplanar CT image reconstructions including MIPs were obtained to evaluate the vascular anatomy.  Contrast: 80mL OMNIPAQUE IOHEXOL 300 MG/ML IV SOLN  Comparison:  Chest radiographs dated 10/01/2011.  CT chest dated 04/04/2011.  Findings:  No evidence of pulmonary embolism.  Mild dependent atelectasis in the bilateral lower lobes.  No  pleural effusion or pneumothorax.  Visualized thyroid is unremarkable.  The heart is normal in size.  No pericardial effusion.  No suspicious mediastinal, hilar, or axillary lymphadenopathy.  Surgical clip in the left breast (series 5/image 53).  Visualized upper abdomen is unremarkable.  Mild degenerative changes of the visualized thoracolumbar spine.  Review of the MIP images confirms the above findings.  IMPRESSION: No evidence of pulmonary embolism.  No evidence of  acute cardiopulmonary disease.  Original Report Authenticated By: Charline Bills, M.D.     Disposition: Discharge home.  Diet: Diabetic diet.  Activity: Resume as tolerated.   Follow-up Appts: Discharge Orders    Future Orders Please Complete By Expires   Diet Carb Modified      Increase activity slowly      Discharge instructions      Comments:   Follow up with Michiel Sites, MD in 1 week.  Discussed with Michiel Sites, MD for consideration of stress test to be done as an outpatient if he feels warranted.        Time spent on discharge, talking to the patient, and coordinating care: 25 mins.   Signed: Cristal Ford, MD 10/02/2011, 9:33 AM

## 2011-10-03 NOTE — Progress Notes (Signed)
10/03/2011 Davie Claud [facility] Case Management Note [phone]       Utilization review completed.  

## 2011-10-17 DIAGNOSIS — H40029 Open angle with borderline findings, high risk, unspecified eye: Secondary | ICD-10-CM | POA: Insufficient documentation

## 2012-03-07 ENCOUNTER — Other Ambulatory Visit: Payer: Self-pay | Admitting: Neurosurgery

## 2012-03-07 DIAGNOSIS — M549 Dorsalgia, unspecified: Secondary | ICD-10-CM

## 2012-03-12 ENCOUNTER — Ambulatory Visit
Admission: RE | Admit: 2012-03-12 | Discharge: 2012-03-12 | Disposition: A | Discharge status: Home / Self Care | Payer: Managed Care, Other (non HMO) | Source: Ambulatory Visit | Attending: Neurosurgery | Admitting: Neurosurgery

## 2012-03-12 VITALS — BP 152/80 | HR 84

## 2012-03-12 DIAGNOSIS — M549 Dorsalgia, unspecified: Secondary | ICD-10-CM

## 2012-03-12 MED ORDER — ONDANSETRON HCL 4 MG/2ML IJ SOLN
4.0000 mg | Freq: Once | INTRAMUSCULAR | Status: AC
  Administered 2012-03-12: 4 mg via INTRAMUSCULAR

## 2012-03-12 MED ORDER — HYDROMORPHONE HCL PF 2 MG/ML IJ SOLN
1.5000 mg | Freq: Once | INTRAMUSCULAR | Status: AC
  Administered 2012-03-12: 1.5 mg via INTRAMUSCULAR

## 2012-03-12 MED ORDER — METHYLPREDNISOLONE ACETATE 40 MG/ML INJ SUSP (RADIOLOG
120.0000 mg | Freq: Once | INTRAMUSCULAR | Status: AC
  Administered 2012-03-12: 120 mg via EPIDURAL

## 2012-03-12 MED ORDER — DIAZEPAM 5 MG PO TABS
10.0000 mg | ORAL_TABLET | Freq: Once | ORAL | Status: AC
  Administered 2012-03-12: 10 mg via ORAL

## 2012-03-12 MED ORDER — IOHEXOL 180 MG/ML  SOLN
17.0000 mL | Freq: Once | INTRAMUSCULAR | Status: AC | PRN
  Administered 2012-03-12: 17 mL via INTRATHECAL

## 2012-03-12 MED ORDER — IOHEXOL 180 MG/ML  SOLN
1.0000 mL | Freq: Once | INTRAMUSCULAR | Status: AC | PRN
  Administered 2012-03-12: 1 mL via EPIDURAL

## 2012-03-12 NOTE — Progress Notes (Signed)
Husband at bedside.  Patient resting quietly without complaint.  jkl 

## 2012-03-12 NOTE — Discharge Instructions (Signed)

## 2012-07-06 ENCOUNTER — Encounter (HOSPITAL_BASED_OUTPATIENT_CLINIC_OR_DEPARTMENT_OTHER): Payer: Self-pay | Admitting: *Deleted

## 2012-07-06 ENCOUNTER — Emergency Department (HOSPITAL_BASED_OUTPATIENT_CLINIC_OR_DEPARTMENT_OTHER)
Admission: EM | Admit: 2012-07-06 | Discharge: 2012-07-06 | Disposition: A | Discharge status: Home / Self Care | Payer: Medicare Other | Attending: Emergency Medicine | Admitting: Emergency Medicine

## 2012-07-06 ENCOUNTER — Emergency Department (HOSPITAL_BASED_OUTPATIENT_CLINIC_OR_DEPARTMENT_OTHER): Payer: Medicare Other

## 2012-07-06 DIAGNOSIS — E669 Obesity, unspecified: Secondary | ICD-10-CM | POA: Insufficient documentation

## 2012-07-06 DIAGNOSIS — E119 Type 2 diabetes mellitus without complications: Secondary | ICD-10-CM | POA: Insufficient documentation

## 2012-07-06 DIAGNOSIS — K219 Gastro-esophageal reflux disease without esophagitis: Secondary | ICD-10-CM | POA: Insufficient documentation

## 2012-07-06 DIAGNOSIS — E7800 Pure hypercholesterolemia, unspecified: Secondary | ICD-10-CM | POA: Insufficient documentation

## 2012-07-06 DIAGNOSIS — Z79899 Other long term (current) drug therapy: Secondary | ICD-10-CM | POA: Insufficient documentation

## 2012-07-06 DIAGNOSIS — Z87891 Personal history of nicotine dependence: Secondary | ICD-10-CM | POA: Insufficient documentation

## 2012-07-06 DIAGNOSIS — R091 Pleurisy: Secondary | ICD-10-CM | POA: Insufficient documentation

## 2012-07-06 LAB — CBC WITH DIFFERENTIAL/PLATELET
HCT: 32.1 % — ABNORMAL LOW (ref 36.0–46.0)
Hemoglobin: 10.6 g/dL — ABNORMAL LOW (ref 12.0–15.0)
Lymphocytes Relative: 36 % (ref 12–46)
MCHC: 33 g/dL (ref 30.0–36.0)
Monocytes Absolute: 0.9 10*3/uL (ref 0.1–1.0)
Monocytes Relative: 10 % (ref 3–12)
Neutro Abs: 4.6 10*3/uL (ref 1.7–7.7)
WBC: 9 10*3/uL (ref 4.0–10.5)

## 2012-07-06 LAB — BASIC METABOLIC PANEL
BUN: 13 mg/dL (ref 6–23)
CO2: 26 mEq/L (ref 19–32)
Chloride: 102 mEq/L (ref 96–112)
Creatinine, Ser: 0.5 mg/dL (ref 0.50–1.10)

## 2012-07-06 MED ORDER — KETOROLAC TROMETHAMINE 30 MG/ML IJ SOLN
30.0000 mg | Freq: Once | INTRAMUSCULAR | Status: AC
  Administered 2012-07-06: 30 mg via INTRAVENOUS
  Filled 2012-07-06: qty 1

## 2012-07-06 NOTE — ED Notes (Signed)
Patient transported to X-ray 

## 2012-07-06 NOTE — ED Notes (Signed)
Pt with cough shortness of breath and sore throat states that she began feeling bad on Wed

## 2012-07-06 NOTE — ED Notes (Signed)
MD at bedside. 

## 2012-07-06 NOTE — ED Provider Notes (Signed)
History     CSN: [id number]  Arrival date & time 07/06/12  0034   First MD Initiated Contact with Patient 07/06/12 0050      Chief Complaint  Patient presents with  . Shortness of Breath    (Consider location/radiation/quality/duration/timing/severity/associated sxs/prior treatment) Patient is a 56 y.o. female presenting with shortness of breath. The history is provided by the patient.  Shortness of Breath  The current episode started 2 days ago. The onset was gradual. The problem occurs continuously. The problem has been gradually worsening. The problem is moderate. Nothing relieves the symptoms. Exacerbated by: taking deep breath and it hurts in her entire thoracic cavity for the last several hours when she takes deep breaths. Associated symptoms include shortness of breath. Pertinent negatives include no chest pain, no chest pressure, no orthopnea, no fever, no rhinorrhea, no sore throat, no stridor, no cough and no wheezing. There was no intake of a foreign body. She was not exposed to toxic fumes. She has had prior hospitalizations. Her past medical history does not include asthma. She has been behaving normally. Urine output has been normal. Sick contacts: [place] Recently, medical care has been given by the PCP.    Past Medical History  Diagnosis Date  . Hypercholesteremia   . Diabetes mellitus   . Anemia   . Obesity   . Anxiety   . GERD (gastroesophageal reflux disease)   . Angina     Past Surgical History  Procedure Date  . Brain surgery   . 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 Brain surgery     For meningioma.    Family History  Problem Relation Age of Onset  . Diabetes Mother   . Lung cancer Father     History  Substance Use Topics  . Smoking status: Former Games developer  . Smokeless tobacco: Not on file  . Alcohol Use: Yes     2-3 times a year.    OB History    Grav Para Term Preterm Abortions TAB SAB Ect Mult Living                    Review of Systems  Constitutional: Negative for fever and diaphoresis.  HENT: Negative for sore throat and rhinorrhea.   Respiratory: Positive for shortness of breath. Negative for cough, chest tightness, wheezing and stridor.   Cardiovascular: Negative for chest pain, palpitations, orthopnea and leg swelling.  All other systems reviewed and are negative.    Allergies  Biaxin  Home Medications   Current Outpatient Rx  Name Route Sig Dispense Refill  . CLONAZEPAM 1 MG PO TABS Oral Take 1 mg by mouth at bedtime as needed. For sleep    . DEXLANSOPRAZOLE 60 MG PO CPDR Oral Take 60 mg by mouth 2 (two) times daily.      Marland Kitchen FERROUS SULFATE 325 (65 FE) MG PO TABS Oral Take 325 mg by mouth daily with breakfast.      . HYDROCODONE-ACETAMINOPHEN 5-325 MG PO TABS Oral Take 1 tablet by mouth every 6 (six) hours as needed. For pain    . LANTUS [place] Subcutaneous Inject 10 Units into the skin once.      Marland Kitchen VICTOZA [place] Subcutaneous Inject 1.2 Units into the skin once.      . METFORMIN HCL 1000 MG PO TABS Oral Take by mouth 2 (two) times daily with a meal.      .  SIMVASTATIN 40 MG PO TABS Oral Take 40 mg by mouth at bedtime.      Marland Kitchen VALACYCLOVIR HCL 1 G PO TABS Oral Take 1,000 mg by mouth daily as needed. For outbreak      BP 154/88  Pulse 91  Temp 98.1 F (36.7 C) (Oral)  Resp 18  Ht 5\' 8"  (1.727 m)  Wt 200 lb (90.719 kg)  BMI 30.41 kg/m2  SpO2 99%  Physical Exam  Constitutional: She is oriented to person, place, and time. She appears well-developed and well-nourished. No distress.  HENT:  Head: Normocephalic and atraumatic.  Mouth/Throat: Oropharynx is clear and moist. No oropharyngeal exudate.  Eyes: Conjunctivae are normal. Pupils are equal, round, and reactive to light.  Neck: Normal range of motion. Neck supple.  Cardiovascular: Normal rate, regular rhythm and intact distal pulses.   Pulmonary/Chest: Effort normal and breath sounds normal. She has no wheezes. She has  no rales. She exhibits no tenderness.  Abdominal: Soft. Bowel sounds are normal.  Musculoskeletal: Normal range of motion. She exhibits no edema.  Neurological: She is alert and oriented to person, place, and time.  Skin: Skin is warm and dry. She is not diaphoretic.  Psychiatric: She has a normal mood and affect.    ED Course  Procedures (including critical care time)  Labs Reviewed  CBC WITH DIFFERENTIAL - Abnormal; Notable for the following:    Hemoglobin 10.6 (*)     HCT 32.1 (*)     MCV 75.0 (*)     MCH 24.8 (*)     RDW 15.6 (*)     All other components within normal limits  BASIC METABOLIC PANEL - Abnormal; Notable for the following:    Glucose, Bld 199 (*)     All other components within normal limits  TROPONIN I  D-DIMER, QUANTITATIVE  TROPONIN I   Dg Chest 2 View  07/06/2012  *RADIOLOGY REPORT*  Clinical Data: Short of breath  CHEST - 2 VIEW  Comparison:  10/01/2011  Findings:  The heart size and mediastinal contours are within normal limits.  Both lungs are clear.  The visualized skeletal structures are unremarkable.Surgical clip in the left breast.  IMPRESSION: No active cardiopulmonary disease.   Original Report Authenticated By: Camelia Phenes, M.D.      1. Pleurisy      Date: 07/06/2012  Rate: 82  Rhythm: normal sinus rhythm  QRS Axis: normal  Intervals: normal  ST/T Wave abnormalities: normal  Conduction Disutrbances: none  Narrative Interpretation: unremarkable      MDM  Symptoms consistent with pleurisy.  In the setting of a negative EKG and 2 negative cardiac enzymes is sufficient to r/o ACS.  Symptoms not consistent with ACS.  Follow up with your family doctor within 2 days.  Return for fevers, chest pain shortness of breath or any concerns.  Patient and husband verbalize understanding and agree to follow up        Kyle Luppino Smitty Cords, MD 07/06/12 [phone]

## 2012-12-06 DIAGNOSIS — D329 Benign neoplasm of meninges, unspecified: Secondary | ICD-10-CM | POA: Insufficient documentation

## 2012-12-06 DIAGNOSIS — IMO0002 Reserved for concepts with insufficient information to code with codable children: Secondary | ICD-10-CM | POA: Insufficient documentation

## 2012-12-06 DIAGNOSIS — E119 Type 2 diabetes mellitus without complications: Secondary | ICD-10-CM | POA: Insufficient documentation

## 2012-12-06 DIAGNOSIS — E785 Hyperlipidemia, unspecified: Secondary | ICD-10-CM | POA: Insufficient documentation

## 2012-12-06 DIAGNOSIS — N3946 Mixed incontinence: Secondary | ICD-10-CM | POA: Insufficient documentation

## 2012-12-15 ENCOUNTER — Encounter (HOSPITAL_BASED_OUTPATIENT_CLINIC_OR_DEPARTMENT_OTHER): Payer: Self-pay | Admitting: Emergency Medicine

## 2012-12-15 ENCOUNTER — Other Ambulatory Visit: Payer: Self-pay

## 2012-12-15 ENCOUNTER — Emergency Department (HOSPITAL_BASED_OUTPATIENT_CLINIC_OR_DEPARTMENT_OTHER)
Admission: EM | Admit: 2012-12-15 | Discharge: 2012-12-15 | Disposition: A | Discharge status: Home / Self Care | Payer: Medicare Other | Attending: Emergency Medicine | Admitting: Emergency Medicine

## 2012-12-15 ENCOUNTER — Emergency Department (HOSPITAL_BASED_OUTPATIENT_CLINIC_OR_DEPARTMENT_OTHER): Payer: Medicare Other

## 2012-12-15 DIAGNOSIS — E7800 Pure hypercholesterolemia, unspecified: Secondary | ICD-10-CM | POA: Insufficient documentation

## 2012-12-15 DIAGNOSIS — K219 Gastro-esophageal reflux disease without esophagitis: Secondary | ICD-10-CM | POA: Insufficient documentation

## 2012-12-15 DIAGNOSIS — D649 Anemia, unspecified: Secondary | ICD-10-CM | POA: Insufficient documentation

## 2012-12-15 DIAGNOSIS — Z87891 Personal history of nicotine dependence: Secondary | ICD-10-CM | POA: Insufficient documentation

## 2012-12-15 DIAGNOSIS — R0789 Other chest pain: Secondary | ICD-10-CM | POA: Insufficient documentation

## 2012-12-15 DIAGNOSIS — E119 Type 2 diabetes mellitus without complications: Secondary | ICD-10-CM | POA: Insufficient documentation

## 2012-12-15 DIAGNOSIS — E669 Obesity, unspecified: Secondary | ICD-10-CM | POA: Insufficient documentation

## 2012-12-15 DIAGNOSIS — R079 Chest pain, unspecified: Secondary | ICD-10-CM

## 2012-12-15 DIAGNOSIS — Z8679 Personal history of other diseases of the circulatory system: Secondary | ICD-10-CM | POA: Insufficient documentation

## 2012-12-15 DIAGNOSIS — F411 Generalized anxiety disorder: Secondary | ICD-10-CM | POA: Insufficient documentation

## 2012-12-15 DIAGNOSIS — Z794 Long term (current) use of insulin: Secondary | ICD-10-CM | POA: Insufficient documentation

## 2012-12-15 DIAGNOSIS — Z79899 Other long term (current) drug therapy: Secondary | ICD-10-CM | POA: Insufficient documentation

## 2012-12-15 LAB — TROPONIN I: Troponin I: <0.3 ng/mL (ref <0.30)

## 2012-12-15 MED ORDER — ASPIRIN 81 MG PO CHEW
CHEWABLE_TABLET | ORAL | Status: AC
  Administered 2012-12-15: 324 mg via ORAL
  Filled 2012-12-15: qty 4

## 2012-12-15 MED ORDER — ASPIRIN 81 MG PO CHEW
324.0000 mg | CHEWABLE_TABLET | Freq: Once | ORAL | Status: AC
  Administered 2012-12-15: 324 mg via ORAL

## 2012-12-15 NOTE — ED Notes (Signed)
MD at bedside. 

## 2012-12-15 NOTE — ED Notes (Signed)
Pt reports sharp shooting chest pain to left chest, that radiates to back and jaw, + sob, worse when lying flat, denies dizziness, + nausea

## 2012-12-15 NOTE — ED Provider Notes (Signed)
History     CSN: [id number]  Arrival date & time 12/15/12  0106   First MD Initiated Contact with Patient 12/15/12 0126      Chief Complaint  Patient presents with  . Chest Pain    (Consider location/radiation/quality/duration/timing/severity/associated sxs/prior treatment) HPI Comments: Pt is a 57 y/o female who presents with CP which she describes as a sharp and shooting pain located in her left chest, radiating from the left side to the right side and this evening was radiating up her esophagus into her throat. She states this this occurs every couple of months, she was seen per her description in October when she had a stress test. I do not find any results in the computer system, she was seen in 2012 where she was admitted to the hospital and found to have atypical chest pain, negative cardiac enzymes and referred to her family Dr. in followup. She states that she only has this pain when she lays down flat, it only occurs at night, it only occurs every several months. She exercises frequently and states that earlier in the day yesterday she was at a aerobics class and had no difficulty, no pain, no shortness of breath and no other complaints. She denies swelling, nausea, vomiting, diaphoresis and has no other complaints this evening. Currently she is chest pain-free while maintaining this semi fowler position.  She has hx of frequent PTX when she was in middle school but no episodes since.  She states that the pain is in a similar location but at this time she only has pain when she lays down and does not feel SOB at this time.  Patient is a 57 y.o. female presenting with chest pain. The history is provided by the patient, the spouse and medical records.  Chest Pain     Past Medical History  Diagnosis Date  . Hypercholesteremia   . Diabetes mellitus   . Anemia   . Obesity   . Anxiety   . GERD (gastroesophageal reflux disease)   . Angina     Past Surgical History  Procedure  Date  . Brain surgery   . 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 Brain surgery     For meningioma.    Family History  Problem Relation Age of Onset  . Diabetes Mother   . Lung cancer Father     History  Substance Use Topics  . Smoking status: Former Games developer  . Smokeless tobacco: Not on file  . Alcohol Use: Yes     Comment: 2-3 times a year.    OB History    Grav Para Term Preterm Abortions TAB SAB Ect Mult Living                  Review of Systems  Cardiovascular: Positive for chest pain.  All other systems reviewed and are negative.    Allergies  Biaxin  Home Medications   Current Outpatient Rx  Name  Route  Sig  Dispense  Refill  . CLONAZEPAM 1 MG PO TABS   Oral   Take 1 mg by mouth at bedtime as needed. For sleep         . DEXLANSOPRAZOLE 60 MG PO CPDR   Oral   Take 60 mg by mouth 2 (two) times daily.           Marland Kitchen FERROUS SULFATE 325 (65 FE) MG PO TABS   Oral   Take 325 mg by mouth  daily with breakfast.           . HYDROCODONE-ACETAMINOPHEN 5-325 MG PO TABS   Oral   Take 1 tablet by mouth every 6 (six) hours as needed. For pain         . LANTUS [place]   Subcutaneous   Inject 10 Units into the skin once.           Marland Kitchen VICTOZA [place]   Subcutaneous   Inject 1.2 Units into the skin once.           . METFORMIN HCL 1000 MG PO TABS   Oral   Take by mouth 2 (two) times daily with a meal.           . SIMVASTATIN 40 MG PO TABS   Oral   Take 40 mg by mouth at bedtime.           Marland Kitchen VALACYCLOVIR HCL 1 G PO TABS   Oral   Take 1,000 mg by mouth daily as needed. For outbreak           There were no vitals taken for this visit.  Physical Exam  Nursing note and vitals reviewed. Constitutional: She appears well-developed and well-nourished. No distress.  HENT:  Head: Normocephalic and atraumatic.  Mouth/Throat: Oropharynx is clear and moist. No oropharyngeal exudate.  Eyes: Conjunctivae normal and EOM are normal. Pupils are equal, round, and  reactive to light. Right eye exhibits no discharge. Left eye exhibits no discharge. No scleral icterus.  Neck: Normal range of motion. Neck supple. No JVD present. No thyromegaly present.  Cardiovascular: Normal rate, regular rhythm, normal heart sounds and intact distal pulses.  Exam reveals no gallop and no friction rub.   No murmur heard. Pulmonary/Chest: Effort normal and breath sounds normal. No respiratory distress. She has no wheezes. She has no rales.  Abdominal: Soft. Bowel sounds are normal. She exhibits no distension and no mass. There is no tenderness.  Musculoskeletal: Normal range of motion. She exhibits no edema and no tenderness.  Lymphadenopathy:    She has no cervical adenopathy.  Neurological: She is alert. Coordination normal.  Skin: Skin is warm and dry. No rash noted. No erythema.  Psychiatric: She has a normal mood and affect. Her behavior is normal.    ED Course  Procedures (including critical care time)   Labs Reviewed  TROPONIN I   No results found.   1. Chest pain       MDM  No peripheral edema, no chest findings, clear heart and lung sounds and normal EKG. Her vital signs show mild hypertension, we will recheck this. She is asymptomatic when she is not laying flat. There are no murmurs rubs or gallops to suggest pericarditis nor are there any EKG findings to suggest the same. I suspect that she has an component of acid reflux disease. She is currently taking medications for that and does not use frequent anti-inflammatory for prednisone. At this time she appears stable, labs pending, anticipate discharge with outpatient followup she they'll be normal.   ED ECG REPORT  I personally interpreted this EKG   Date: 12/15/2012   Rate: 81  Rhythm: normal sinus rhythm  QRS Axis: normal  Intervals: normal  ST/T Wave abnormalities: normal  Conduction Disutrbances:none  Narrative Interpretation:   Old EKG Reviewed: Compared with 07/06/2012, no significant  changes   Trop negative, ECG normal and unchanged - CXR pending at this time to r/o recurrent PTX / PNA though unlikely.  PA and lateral views of the chest were obtained by digital radiography. I have personally interpreted these x-rays and find her to be no signs of pulmonary infiltrate, cardiomegaly, subdiaphragmatic free air, soft tissue abnormality, no obvious bony abnormalities or fractures.  Vida Roller, MD 12/15/12 [phone]

## 2012-12-15 NOTE — ED Notes (Signed)
Pt ambulatory to bathroom without difficulty.  

## 2012-12-15 NOTE — ED Notes (Signed)
Pt took tylenol with sinus medication when pain started and pt feels that it made pain worse

## 2013-01-04 ENCOUNTER — Other Ambulatory Visit: Payer: Self-pay | Admitting: Endocrinology

## 2013-01-04 DIAGNOSIS — R945 Abnormal results of liver function studies: Secondary | ICD-10-CM

## 2013-01-08 ENCOUNTER — Ambulatory Visit
Admission: RE | Admit: 2013-01-08 | Discharge: 2013-01-08 | Disposition: A | Discharge status: Home / Self Care | Payer: Medicare Other | Source: Ambulatory Visit | Attending: Endocrinology | Admitting: Endocrinology

## 2013-01-08 DIAGNOSIS — R945 Abnormal results of liver function studies: Secondary | ICD-10-CM

## 2013-02-12 DIAGNOSIS — N398 Other specified disorders of urinary system: Secondary | ICD-10-CM | POA: Insufficient documentation

## 2013-02-12 DIAGNOSIS — N816 Rectocele: Secondary | ICD-10-CM | POA: Insufficient documentation

## 2013-02-27 DIAGNOSIS — G8929 Other chronic pain: Secondary | ICD-10-CM | POA: Insufficient documentation

## 2013-02-27 DIAGNOSIS — M545 Low back pain, unspecified: Secondary | ICD-10-CM | POA: Insufficient documentation

## 2013-08-08 ENCOUNTER — Encounter: Payer: Medicare Other | Admitting: Neurology

## 2013-10-17 DIAGNOSIS — M549 Dorsalgia, unspecified: Secondary | ICD-10-CM | POA: Insufficient documentation

## 2013-12-25 DIAGNOSIS — H04129 Dry eye syndrome of unspecified lacrimal gland: Secondary | ICD-10-CM | POA: Insufficient documentation

## 2013-12-25 DIAGNOSIS — H269 Unspecified cataract: Secondary | ICD-10-CM | POA: Insufficient documentation

## 2014-04-15 ENCOUNTER — Emergency Department (HOSPITAL_BASED_OUTPATIENT_CLINIC_OR_DEPARTMENT_OTHER): Payer: Medicare Other

## 2014-04-15 ENCOUNTER — Encounter (HOSPITAL_BASED_OUTPATIENT_CLINIC_OR_DEPARTMENT_OTHER): Payer: Self-pay | Admitting: Emergency Medicine

## 2014-04-15 ENCOUNTER — Emergency Department (HOSPITAL_BASED_OUTPATIENT_CLINIC_OR_DEPARTMENT_OTHER)
Admission: EM | Admit: 2014-04-15 | Discharge: 2014-04-16 | Disposition: A | Discharge status: Home / Self Care | Payer: Medicare Other | Source: Home / Self Care | Attending: Emergency Medicine | Admitting: Emergency Medicine

## 2014-04-15 DIAGNOSIS — K5289 Other specified noninfective gastroenteritis and colitis: Secondary | ICD-10-CM

## 2014-04-15 DIAGNOSIS — E119 Type 2 diabetes mellitus without complications: Secondary | ICD-10-CM

## 2014-04-15 DIAGNOSIS — E669 Obesity, unspecified: Secondary | ICD-10-CM

## 2014-04-15 DIAGNOSIS — Z862 Personal history of diseases of the blood and blood-forming organs and certain disorders involving the immune mechanism: Secondary | ICD-10-CM | POA: Insufficient documentation

## 2014-04-15 DIAGNOSIS — K219 Gastro-esophageal reflux disease without esophagitis: Secondary | ICD-10-CM

## 2014-04-15 DIAGNOSIS — E7800 Pure hypercholesterolemia, unspecified: Secondary | ICD-10-CM

## 2014-04-15 DIAGNOSIS — Z87891 Personal history of nicotine dependence: Secondary | ICD-10-CM | POA: Insufficient documentation

## 2014-04-15 DIAGNOSIS — Z8679 Personal history of other diseases of the circulatory system: Secondary | ICD-10-CM | POA: Insufficient documentation

## 2014-04-15 DIAGNOSIS — Z794 Long term (current) use of insulin: Secondary | ICD-10-CM

## 2014-04-15 DIAGNOSIS — Z79899 Other long term (current) drug therapy: Secondary | ICD-10-CM

## 2014-04-15 DIAGNOSIS — F411 Generalized anxiety disorder: Secondary | ICD-10-CM | POA: Insufficient documentation

## 2014-04-15 DIAGNOSIS — Z90710 Acquired absence of both cervix and uterus: Secondary | ICD-10-CM

## 2014-04-15 DIAGNOSIS — K529 Noninfective gastroenteritis and colitis, unspecified: Secondary | ICD-10-CM

## 2014-04-15 DIAGNOSIS — R109 Unspecified abdominal pain: Secondary | ICD-10-CM

## 2014-04-15 DIAGNOSIS — R112 Nausea with vomiting, unspecified: Secondary | ICD-10-CM | POA: Diagnosis not present

## 2014-04-15 LAB — URINE MICROSCOPIC-ADD ON

## 2014-04-15 LAB — COMPREHENSIVE METABOLIC PANEL
ALK PHOS: 104 U/L (ref 39–117)
ALT: 10 U/L (ref 0–35)
AST: 15 U/L (ref 0–37)
Albumin: 3.9 g/dL (ref 3.5–5.2)
BILIRUBIN TOTAL: 0.2 mg/dL — AB (ref 0.3–1.2)
BUN: 9 mg/dL (ref 6–23)
CHLORIDE: 106 meq/L (ref 96–112)
CO2: 25 meq/L (ref 19–32)
CREATININE: 0.7 mg/dL (ref 0.50–1.10)
Calcium: 9.7 mg/dL (ref 8.4–10.5)
GFR calc Af Amer: >90 mL/min (ref >90)
Glucose, Bld: 153 mg/dL — ABNORMAL HIGH (ref 70–99)
POTASSIUM: 4 meq/L (ref 3.7–5.3)
Sodium: 144 mEq/L (ref 137–147)
Total Protein: 7.6 g/dL (ref 6.0–8.3)

## 2014-04-15 LAB — URINALYSIS, ROUTINE W REFLEX MICROSCOPIC
BILIRUBIN URINE: NEGATIVE
HGB URINE DIPSTICK: NEGATIVE
Ketones, ur: NEGATIVE mg/dL
Leukocytes, UA: NEGATIVE
Nitrite: NEGATIVE
PH: 5.5 (ref 5.0–8.0)
Protein, ur: NEGATIVE mg/dL
SPECIFIC GRAVITY, URINE: 1.016 (ref 1.005–1.030)
Urobilinogen, UA: 1 mg/dL (ref 0.0–1.0)

## 2014-04-15 LAB — CBC WITH DIFFERENTIAL/PLATELET
BASOS ABS: 0 10*3/uL (ref 0.0–0.1)
Basophils Relative: 0 % (ref 0–1)
Eosinophils Absolute: 0.2 10*3/uL (ref 0.0–0.7)
Eosinophils Relative: 3 % (ref 0–5)
HEMATOCRIT: 34.6 % — AB (ref 36.0–46.0)
HEMOGLOBIN: 11.5 g/dL — AB (ref 12.0–15.0)
LYMPHS PCT: 31 % (ref 12–46)
Lymphs Abs: 2.2 10*3/uL (ref 0.7–4.0)
MCH: 24.9 pg — ABNORMAL LOW (ref 26.0–34.0)
MCHC: 33.2 g/dL (ref 30.0–36.0)
MCV: 75.1 fL — ABNORMAL LOW (ref 78.0–100.0)
MONO ABS: 0.9 10*3/uL (ref 0.1–1.0)
MONOS PCT: 13 % — AB (ref 3–12)
NEUTROS ABS: 3.9 10*3/uL (ref 1.7–7.7)
Neutrophils Relative %: 53 % (ref 43–77)
Platelets: 212 10*3/uL (ref 150–400)
RBC: 4.61 MIL/uL (ref 3.87–5.11)
RDW: 16.1 % — AB (ref 11.5–15.5)
WBC: 7.3 10*3/uL (ref 4.0–10.5)

## 2014-04-15 LAB — LIPASE, BLOOD: LIPASE: 45 U/L (ref 11–59)

## 2014-04-15 MED ORDER — IOHEXOL 300 MG/ML  SOLN
100.0000 mL | Freq: Once | INTRAMUSCULAR | Status: AC | PRN
  Administered 2014-04-15: 100 mL via INTRAVENOUS

## 2014-04-15 MED ORDER — FENTANYL CITRATE 0.05 MG/ML IJ SOLN
50.0000 ug | Freq: Once | INTRAMUSCULAR | Status: AC
  Administered 2014-04-15: 50 ug via INTRAVENOUS
  Filled 2014-04-15: qty 2

## 2014-04-15 MED ORDER — IOHEXOL 300 MG/ML  SOLN
50.0000 mL | Freq: Once | INTRAMUSCULAR | Status: AC | PRN
  Administered 2014-04-15: 50 mL via ORAL

## 2014-04-15 NOTE — ED Notes (Signed)
C/o abd pain/cramping  Diarrhea nausea onset yesterday am after eating donut,  States diet has changed last 2 weeks

## 2014-04-15 NOTE — ED Notes (Signed)
abd pain, nausea started yesterday-worse after eating-last dose pain med hydrocodone 5pm

## 2014-04-15 NOTE — ED Provider Notes (Signed)
CSN: [id number]     Arrival date & time 04/15/14  1931 History  This chart was scribed for Bonnie Clonts, MD by Vernell Barrier, ED scribe. This patient was seen in room MH05/MH05 and the patient's care was started at 9:43 PM.    Chief Complaint  Patient presents with  . Abdominal Pain   Patient is a 58 y.o. female presenting with abdominal pain. The history is provided by the patient. No language interpreter was used.  Abdominal Pain Pain location:  Generalized Pain quality: aching and dull   Pain radiates to:  Does not radiate Pain severity:  Moderate Onset quality:  Gradual Duration:  1 day Timing:  Constant Progression:  Worsening Chronicity:  New Context: eating   Relieved by:  Nothing Worsened by:  Eating Associated symptoms: no chest pain, no chills, no fever and no vomiting    HPI Comments: Bonnie [name] is a 58 y.o. female w/ hx of diabetes presents to the Emergency Department complaining of dull achy abdominal pain; onset 1 day ago. Some associated nausea after eating a donut last night. Pain worse with food and cough. Diet has changed over the past couple of days.Took 2 stool softeners last night but reports only light BMs. States she took some hydrocodone approximately 5 hours ago which provided mild relief for abdominal pain. No hx of gall stones or ulcers. No regular alcohol drinking. Healthy overall. Denies fever, chills, chest pain, leg swelling, leg pain, vomiting.   Past Medical History  Diagnosis Date  . Hypercholesteremia   . Diabetes mellitus   . Anemia   . Obesity   . Anxiety   . GERD (gastroesophageal reflux disease)   . Angina    Past Surgical History  Procedure Laterality Date  . Brain surgery    . 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 Brain surgery      For meningioma.  . Abdominal hysterectomy    . Shoulder surgery    . Coccyx removal    . Tonsillectomy    . Bladder surgery     Family History  Problem Relation Age of Onset  . Diabetes Mother    . Lung cancer Father    History  Substance Use Topics  . Smoking status: Former Research scientist (life sciences)  . Smokeless tobacco: Not on file  . Alcohol Use: No   OB History   Grav Para Term Preterm Abortions TAB SAB Ect Mult Living                 Review of Systems  Constitutional: Negative for fever and chills.  Cardiovascular: Negative for chest pain and leg swelling.  Gastrointestinal: Positive for abdominal pain. Negative for vomiting.  All other systems reviewed and are negative.   Allergies  Biaxin  Home Medications   Prior to Admission medications   Medication Sig Start Date End Date Taking? Authorizing Provider  Dapagliflozin Propanediol (FARXIGA PO) Take by mouth.   Yes Historical Provider, MD  RAMIPRIL PO Take by mouth.   Yes Historical Provider, MD  clonazePAM (KLONOPIN) 1 MG tablet Take 1 mg by mouth at bedtime as needed. For sleep    Historical Provider, MD  dexlansoprazole (DEXILANT) 60 MG capsule Take 60 mg by mouth 2 (two) times daily.      Historical Provider, MD  ferrous sulfate 325 (65 FE) MG tablet Take 325 mg by mouth daily with breakfast.      Historical Provider, MD  HYDROcodone-acetaminophen [facility]) 5-325  MG per tablet Take 1 tablet by mouth every 6 (six) hours as needed. For pain    Historical Provider, MD  Insulin Glargine (LANTUS [place]) Inject 10 Units into the skin once.      Historical Provider, MD  Liraglutide (VICTOZA [place]) Inject 1.2 Units into the skin once.      Historical Provider, MD  metFORMIN (GLUCOPHAGE) 1000 MG tablet Take by mouth 2 (two) times daily with a meal.      Historical Provider, MD  simvastatin (ZOCOR) 40 MG tablet Take 40 mg by mouth at bedtime.      Historical Provider, MD  valACYclovir (VALTREX) 1000 MG tablet Take 1,000 mg by mouth daily as needed. For outbreak    Historical Provider, MD   Triage vitals: BP 149/71  Pulse 97  Temp(Src) 99 F (37.2 C) (Oral)  Resp 16  Ht 5\' 8"  (1.727 m)  Wt 180 lb (81.647 kg)  BMI 27.38 kg/m2  SpO2  100% Physical Exam  Nursing note and vitals reviewed. Constitutional: She is oriented to person, place, and time. She appears well-developed and well-nourished. No distress.  HENT:  Head: Normocephalic and atraumatic.  Mouth/Throat: Oropharynx is clear and moist.  Eyes: Conjunctivae and EOM are normal. Pupils are equal, round, and reactive to light. No scleral icterus.  Neck: Normal range of motion. Neck supple. No tracheal deviation present.  Cardiovascular: Normal rate.   Pulmonary/Chest: Effort normal. No respiratory distress.  Abdominal:  Diffuse tenderness most severe in RLQ.  Musculoskeletal: Normal range of motion.  Neurological: She is alert and oriented to person, place, and time.  Skin: Skin is warm and dry.  Psychiatric: She has a normal mood and affect. Her behavior is normal.    ED Course  Procedures (including critical care time) DIAGNOSTIC STUDIES: Oxygen Saturation is 100% on room air, normal by my interpretation.    COORDINATION OF CARE: At 9:48 PM: Discussed treatment plan with patient which includes CT scan of the abdomen. Patient agrees.   Labs Review Labs Reviewed  URINALYSIS, ROUTINE W REFLEX MICROSCOPIC - Abnormal; Notable for the following:    Glucose, UA >1000 (*)    All other components within normal limits  CBC WITH DIFFERENTIAL - Abnormal; Notable for the following:    Hemoglobin 11.5 (*)    HCT 34.6 (*)    MCV 75.1 (*)    MCH 24.9 (*)    RDW 16.1 (*)    Monocytes Relative 13 (*)    All other components within normal limits  COMPREHENSIVE METABOLIC PANEL - Abnormal; Notable for the following:    Glucose, Bld 153 (*)    Total Bilirubin 0.2 (*)    All other components within normal limits  URINE MICROSCOPIC-ADD ON  LIPASE, BLOOD  I-STAT CG4 LACTIC ACID, ED  I-STAT CG4 LACTIC ACID, ED    Imaging Review Ct Abdomen Pelvis W Contrast  04/15/2014   CLINICAL DATA:  Abdominal pain.  EXAM: CT ABDOMEN AND PELVIS WITH CONTRAST  TECHNIQUE:  Multidetector CT imaging of the abdomen and pelvis was performed using the standard protocol following bolus administration of intravenous contrast.  CONTRAST:  91mL OMNIPAQUE IOHEXOL 300 MG/ML SOLN, 156mL OMNIPAQUE IOHEXOL 300 MG/ML SOLN  COMPARISON:  03/12/2012  FINDINGS: BODY WALL: Unremarkable.  LOWER CHEST: Unremarkable.  ABDOMEN/PELVIS:  Liver: No focal abnormality.  Biliary: No evidence of biliary obstruction or stone.  Pancreas: Unremarkable.  Spleen: Unremarkable.  Adrenals: Unremarkable.  Kidneys and ureters: No hydronephrosis or stone.  Bladder: Unremarkable.  Reproductive: Hysterectomy.  Bowel: There is a short segment of marked submucosal edema and mucosal hyper enhancement involving bowel in the right lower quadrant. The affected segment is in continuity with the ileum (rather than a Meckel's diverticulum). No bowel obstruction. Normal appendix.  Retroperitoneum: No mass or adenopathy.  Peritoneum: No free fluid or gas.  Vascular: No acute abnormality. Aortic and branch vessel atherosclerosis.  OSSEOUS: L4-5 and L5-S1 posterior lumbar interbody fusion with rod and pedicle screw fixation. There is complete bony fusion across the disc level.  IMPRESSION: Short-segment ileitis which could be infectious, autoimmune, or vascular. No obstruction or perforation.   Electronically Signed   By: Jorje Guild M.D.   On: 04/15/2014 23:21     EKG Interpretation None      MDM   Final diagnoses:  Ileitis  Abdominal pain    I personally performed the services described in this documentation, which was scribed in my presence. The recorded information has been reviewed and is accurate.  Patient's pain improved in the ER attending gradually worsened requiring a second dose of IV narcotics. On recheck patient well-appearing and vitals unremarkable with mild high blood pressure. Lactic acid normal on 2 checks. CT scan showed nonspecific ileitis with main differential infectious versus vascular.  Patient does not have any known atrial fibrillation however does have diabetes. Discuss case with primary care Dr. on call to help decide whether transfer for further workup in the hospital versus outpatient workup, he was comfortable with the patient being seen tomorrow in the office for further evaluation.  Results and differential diagnosis were discussed with the patient/parent/guardian. Close follow up outpatient was discussed, comfortable with the plan.   Filed Vitals:   04/15/14 1950  BP: 149/71  Pulse: 97  Temp: 99 F (37.2 C)  TempSrc: Oral  Resp: 16  Height: 5\' 8"  (1.727 m)  Weight: 180 lb (81.647 kg)  SpO2: 100%        Bonnie Clonts, MD 04/16/14 0105

## 2014-04-16 LAB — I-STAT CG4 LACTIC ACID, ED: Lactic Acid, Venous: 1.08 mmol/L (ref 0.5–2.2)

## 2014-04-16 MED ORDER — METRONIDAZOLE 500 MG PO TABS: 500.0000 mg | ORAL_TABLET | Freq: Three times a day (TID) | ORAL | Status: DC

## 2014-04-16 MED ORDER — MORPHINE SULFATE 4 MG/ML IJ SOLN
4.0000 mg | Freq: Once | INTRAMUSCULAR | Status: AC
  Administered 2014-04-16: 4 mg via INTRAVENOUS
  Filled 2014-04-16: qty 1

## 2014-04-16 MED ORDER — CIPROFLOXACIN HCL 500 MG PO TABS: 500.0000 mg | ORAL_TABLET | Freq: Two times a day (BID) | ORAL | Status: DC

## 2014-04-16 MED ORDER — HYDROCODONE-ACETAMINOPHEN 5-325 MG PO TABS: 1.0000 | ORAL_TABLET | ORAL | Status: DC | PRN

## 2014-04-16 MED ORDER — SODIUM CHLORIDE 0.9 % IV BOLUS (SEPSIS)
500.0000 mL | Freq: Once | INTRAVENOUS | Status: AC
  Administered 2014-04-16: 500 mL via INTRAVENOUS

## 2014-04-16 NOTE — Discharge Instructions (Signed)
See her doctor later today to discuss further work up. For severe pain take norco or vicodin however realize they have the potential for addiction and it can make you sleepy and has tylenol in it.  No operating machinery while taking. Soft diet. If you were given medicines take as directed.  If you are on coumadin or contraceptives realize their levels and effectiveness is altered by many different medicines.  If you have any reaction (rash, tongues swelling, other) to the medicines stop taking and see a physician.   Please follow up as directed and return to the ER or see a physician for new or worsening symptoms.  Thank you. Filed Vitals:   04/15/14 1950  BP: 149/71  Pulse: 97  Temp: 99 F (37.2 C)  TempSrc: Oral  Resp: 16  Height: 5\' 8"  (1.727 m)  Weight: 180 lb (81.647 kg)  SpO2: 100%

## 2014-04-19 ENCOUNTER — Inpatient Hospital Stay (HOSPITAL_BASED_OUTPATIENT_CLINIC_OR_DEPARTMENT_OTHER)
Admission: EM | Admit: 2014-04-19 | Discharge: 2014-04-22 | DRG: 392 | Disposition: A | Discharge status: Home / Self Care | Payer: Medicare Other | Attending: Internal Medicine | Admitting: Internal Medicine

## 2014-04-19 ENCOUNTER — Encounter (HOSPITAL_BASED_OUTPATIENT_CLINIC_OR_DEPARTMENT_OTHER): Payer: Self-pay | Admitting: Emergency Medicine

## 2014-04-19 ENCOUNTER — Emergency Department (HOSPITAL_BASED_OUTPATIENT_CLINIC_OR_DEPARTMENT_OTHER): Payer: Medicare Other

## 2014-04-19 DIAGNOSIS — E669 Obesity, unspecified: Secondary | ICD-10-CM | POA: Diagnosis present

## 2014-04-19 DIAGNOSIS — R112 Nausea with vomiting, unspecified: Secondary | ICD-10-CM | POA: Diagnosis present

## 2014-04-19 DIAGNOSIS — R197 Diarrhea, unspecified: Secondary | ICD-10-CM | POA: Diagnosis present

## 2014-04-19 DIAGNOSIS — E119 Type 2 diabetes mellitus without complications: Secondary | ICD-10-CM | POA: Diagnosis present

## 2014-04-19 DIAGNOSIS — K219 Gastro-esophageal reflux disease without esophagitis: Secondary | ICD-10-CM | POA: Diagnosis present

## 2014-04-19 DIAGNOSIS — E7800 Pure hypercholesterolemia, unspecified: Secondary | ICD-10-CM

## 2014-04-19 DIAGNOSIS — D649 Anemia, unspecified: Secondary | ICD-10-CM | POA: Diagnosis present

## 2014-04-19 DIAGNOSIS — F411 Generalized anxiety disorder: Secondary | ICD-10-CM | POA: Diagnosis present

## 2014-04-19 DIAGNOSIS — R1110 Vomiting, unspecified: Secondary | ICD-10-CM

## 2014-04-19 DIAGNOSIS — K529 Noninfective gastroenteritis and colitis, unspecified: Secondary | ICD-10-CM | POA: Diagnosis present

## 2014-04-19 DIAGNOSIS — K5289 Other specified noninfective gastroenteritis and colitis: Principal | ICD-10-CM | POA: Diagnosis present

## 2014-04-19 DIAGNOSIS — Z79899 Other long term (current) drug therapy: Secondary | ICD-10-CM | POA: Diagnosis not present

## 2014-04-19 DIAGNOSIS — Z6827 Body mass index (BMI) 27.0-27.9, adult: Secondary | ICD-10-CM

## 2014-04-19 DIAGNOSIS — R109 Unspecified abdominal pain: Secondary | ICD-10-CM

## 2014-04-19 DIAGNOSIS — E785 Hyperlipidemia, unspecified: Secondary | ICD-10-CM | POA: Diagnosis present

## 2014-04-19 DIAGNOSIS — Z87891 Personal history of nicotine dependence: Secondary | ICD-10-CM

## 2014-04-19 DIAGNOSIS — I10 Essential (primary) hypertension: Secondary | ICD-10-CM | POA: Diagnosis present

## 2014-04-19 LAB — CBC WITH DIFFERENTIAL/PLATELET
BASOS ABS: 0 10*3/uL (ref 0.0–0.1)
BASOS PCT: 0 % (ref 0–1)
EOS ABS: 0.1 10*3/uL (ref 0.0–0.7)
EOS PCT: 1 % (ref 0–5)
HEMATOCRIT: 37.1 % (ref 36.0–46.0)
Hemoglobin: 12.4 g/dL (ref 12.0–15.0)
Lymphocytes Relative: 28 % (ref 12–46)
Lymphs Abs: 1.9 10*3/uL (ref 0.7–4.0)
MCH: 24.9 pg — AB (ref 26.0–34.0)
MCHC: 33.4 g/dL (ref 30.0–36.0)
MCV: 74.6 fL — AB (ref 78.0–100.0)
MONO ABS: 1.1 10*3/uL — AB (ref 0.1–1.0)
MONOS PCT: 16 % — AB (ref 3–12)
NEUTROS ABS: 3.8 10*3/uL (ref 1.7–7.7)
Neutrophils Relative %: 55 % (ref 43–77)
Platelets: 232 10*3/uL (ref 150–400)
RBC: 4.97 MIL/uL (ref 3.87–5.11)
RDW: 16.2 % — AB (ref 11.5–15.5)
WBC: 7 10*3/uL (ref 4.0–10.5)

## 2014-04-19 LAB — BASIC METABOLIC PANEL
BUN: 7 mg/dL (ref 6–23)
CALCIUM: 9.7 mg/dL (ref 8.4–10.5)
CO2: 27 mEq/L (ref 19–32)
CREATININE: 0.7 mg/dL (ref 0.50–1.10)
Chloride: 102 mEq/L (ref 96–112)
Glucose, Bld: 112 mg/dL — ABNORMAL HIGH (ref 70–99)
Potassium: 4.5 mEq/L (ref 3.7–5.3)
Sodium: 140 mEq/L (ref 137–147)

## 2014-04-19 LAB — I-STAT CG4 LACTIC ACID, ED: Lactic Acid, Venous: 1.22 mmol/L (ref 0.5–2.2)

## 2014-04-19 LAB — SEDIMENTATION RATE: SED RATE: 21 mm/h (ref 0–22)

## 2014-04-19 MED ORDER — SODIUM CHLORIDE 0.9 % IV SOLN
Freq: Once | INTRAVENOUS | Status: AC
Start: 2014-04-19 — End: 2014-04-19
  Administered 2014-04-19: 21:00:00 via INTRAVENOUS

## 2014-04-19 MED ORDER — ONDANSETRON HCL 4 MG/2ML IJ SOLN
4.0000 mg | Freq: Once | INTRAMUSCULAR | Status: AC
  Administered 2014-04-19: 4 mg via INTRAVENOUS
  Filled 2014-04-19: qty 2

## 2014-04-19 MED ORDER — SODIUM CHLORIDE 0.9 % IV BOLUS (SEPSIS)
1000.0000 mL | Freq: Once | INTRAVENOUS | Status: AC
  Administered 2014-04-19: 1000 mL via INTRAVENOUS

## 2014-04-19 MED ORDER — MORPHINE SULFATE 4 MG/ML IJ SOLN
4.0000 mg | INTRAMUSCULAR | Status: DC | PRN
  Administered 2014-04-19 (×2): 4 mg via INTRAVENOUS
  Filled 2014-04-19 (×2): qty 1

## 2014-04-19 MED ORDER — IOHEXOL 300 MG/ML  SOLN
50.0000 mL | Freq: Once | INTRAMUSCULAR | Status: AC | PRN
  Administered 2014-04-19: 50 mL via ORAL

## 2014-04-19 MED ORDER — IOHEXOL 300 MG/ML  SOLN
100.0000 mL | Freq: Once | INTRAMUSCULAR | Status: AC | PRN
  Administered 2014-04-19: 100 mL via INTRAVENOUS

## 2014-04-19 NOTE — Progress Notes (Signed)
Patient presents was seen in the ED 4 days ago, diagnosed with ileitis, send home on cipro and flagyl. Presents today with Nausea and vomiting. CT showed mild worsening ileitis. Patient stable for med-surgery Bed.

## 2014-04-19 NOTE — ED Notes (Signed)
Returns for re-evaluation of abdominal pain, nausea, and vomiting.  Seen on Wednesday here.  Still having similar symptoms.

## 2014-04-19 NOTE — ED Notes (Signed)
MD at bedside. 

## 2014-04-19 NOTE — ED Provider Notes (Addendum)
CSN: [id number]     Arrival date & time 04/19/14  1713 History  This chart was scribed for Tanna Furry, MD by Randa Evens, ED Scribe. This patient was seen in room MH07/MH07 and the patient's care was started at 6:50 PM.    Chief Complaint  Patient presents with  . Abdominal Pain    PCP is Dr. Wilson Singer Patient is a 58 y.o. female presenting with abdominal pain. The history is provided by the patient. No language interpreter was used.  Abdominal Pain Associated symptoms: nausea and vomiting   Associated symptoms: no chest pain, no chills, no cough, no diarrhea, no dysuria, no fatigue, no fever, no hematuria, no shortness of breath and no sore throat    HPI Comments: Bonnie [name] is a 58 y.o. female who presents to the Emergency Department complaining of abdominal pain onset Monday morning. States that she was recently here in ED. States that she ate a doughnut after returning from the gym that caused her to be sick. She states that any eating worsened her symptoms. She states her last bowel movement was yesterday. States she has associated diarrhea, and non productive emesis. States she has been diabetic for 19 years.   Patient had a CT scan showing ileitis with wall thickening. No diverticulitis. Normal appendix. Discharge with antibiotics and pain medications. She is just really not done well she had continued pain. Almost persistent vomiting since that time able to take some liquids. Pain not well controlled with medications at home. No past similar episodes of obstruction, inflammatory bowel disorders, bowel obstructions, or ischemic bowel.   Past Medical History  Diagnosis Date  . Hypercholesteremia   . Diabetes mellitus   . Anemia   . Obesity   . Anxiety   . GERD (gastroesophageal reflux disease)   . Angina    Past Surgical History  Procedure Laterality Date  . Brain surgery    . 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 Brain surgery      For meningioma.  . Abdominal hysterectomy     . Shoulder surgery    . Coccyx removal    . Tonsillectomy    . Bladder surgery     Family History  Problem Relation Age of Onset  . Diabetes Mother   . Lung cancer Father    History  Substance Use Topics  . Smoking status: Former Research scientist (life sciences)  . Smokeless tobacco: Not on file  . Alcohol Use: No   OB History   Grav Para Term Preterm Abortions TAB SAB Ect Mult Living                 Review of Systems  Constitutional: Negative for fever, chills, diaphoresis, appetite change and fatigue.  HENT: Negative for mouth sores, sore throat and trouble swallowing.   Eyes: Negative for visual disturbance.  Respiratory: Negative for cough, chest tightness, shortness of breath and wheezing.   Cardiovascular: Negative for chest pain.  Gastrointestinal: Positive for nausea, vomiting and abdominal pain. Negative for diarrhea and abdominal distention.  Endocrine: Negative for polydipsia, polyphagia and polyuria.  Genitourinary: Negative for dysuria, frequency and hematuria.  Musculoskeletal: Negative for gait problem.  Skin: Negative for color change, pallor and rash.  Neurological: Negative for dizziness, syncope, light-headedness and headaches.  Hematological: Does not bruise/bleed easily.  Psychiatric/Behavioral: Negative for behavioral problems and confusion.   Allergies  Biaxin and Carafate  Home Medications   Prior to Admission medications   Medication Sig Start  Date End Date Taking? Authorizing Provider  ciprofloxacin (CIPRO) 500 MG tablet Take 1 tablet (500 mg total) by mouth 2 (two) times daily. One po bid x 7 days 04/16/14   Karen Chafe Molpus, MD  clonazePAM (KLONOPIN) 1 MG tablet Take 1 mg by mouth at bedtime as needed. For sleep    Historical Provider, MD  Dapagliflozin Propanediol (FARXIGA PO) Take by mouth.    Historical Provider, MD  dexlansoprazole (DEXILANT) 60 MG capsule Take 60 mg by mouth 2 (two) times daily.      Historical Provider, MD  ferrous sulfate 325 (65 FE) MG tablet  Take 325 mg by mouth daily with breakfast.      Historical Provider, MD  HYDROcodone-acetaminophen (NORCO) 5-325 MG per tablet Take 1 tablet by mouth every 6 (six) hours as needed. For pain    Historical Provider, MD  HYDROcodone-acetaminophen (NORCO) 5-325 MG per tablet Take 1-2 tablets by mouth every 4 (four) hours as needed. 04/16/14   John L Molpus, MD  Insulin Glargine (LANTUS [place]) Inject 10 Units into the skin once.      Historical Provider, MD  Liraglutide (VICTOZA [place]) Inject 1.2 Units into the skin once.      Historical Provider, MD  metFORMIN (GLUCOPHAGE) 1000 MG tablet Take by mouth 2 (two) times daily with a meal.      Historical Provider, MD  metroNIDAZOLE (FLAGYL) 500 MG tablet Take 1 tablet (500 mg total) by mouth 3 (three) times daily. 04/16/14   Karen Chafe Molpus, MD  RAMIPRIL PO Take by mouth.    Historical Provider, MD  simvastatin (ZOCOR) 40 MG tablet Take 40 mg by mouth at bedtime.      Historical Provider, MD  valACYclovir (VALTREX) 1000 MG tablet Take 1,000 mg by mouth daily as needed. For outbreak    Historical Provider, MD   Triage Vitals: BP 122/77  Pulse 102  Temp(Src) 98.9 F (37.2 C) (Oral)  Resp 20  Ht 5\' 8"  (1.727 m)  Wt 178 lb (80.74 kg)  BMI 27.07 kg/m2  SpO2 98%  Physical Exam  Nursing note and vitals reviewed. Constitutional: She is oriented to person, place, and time. She appears well-developed and well-nourished. No distress.  HENT:  Head: Normocephalic.  Eyes: Conjunctivae are normal. Pupils are equal, round, and reactive to light. No scleral icterus.  Neck: Normal range of motion. Neck supple. No thyromegaly present.  Cardiovascular: Normal rate and regular rhythm.  Exam reveals no gallop and no friction rub.   No murmur heard. Pulmonary/Chest: Effort normal and breath sounds normal. No respiratory distress. She has no wheezes. She has no rales.  Abdominal: Soft. Bowel sounds are normal. She exhibits no distension. There is tenderness. There is no rebound.   Tenderness in RLQ  Musculoskeletal: Normal range of motion.  Neurological: She is alert and oriented to person, place, and time.  Skin: Skin is warm and dry. No rash noted.  Psychiatric: She has a normal mood and affect. Her behavior is normal.    ED Course  Procedures (including critical care time) DIAGNOSTIC STUDIES: Oxygen Saturation is 98% on RA, normal by my interpretation.    COORDINATION OF CARE: 6:58 PM-Discussed treatment plan which includes CT scan  with pt at bedside and pt agreed to plan.    Labs Review Labs Reviewed  CBC WITH DIFFERENTIAL - Abnormal; Notable for the following:    MCV 74.6 (*)    MCH 24.9 (*)    RDW 16.2 (*)    Monocytes  Relative 16 (*)    Monocytes Absolute 1.1 (*)    All other components within normal limits  BASIC METABOLIC PANEL - Abnormal; Notable for the following:    Glucose, Bld 112 (*)    All other components within normal limits  SEDIMENTATION RATE  I-STAT CG4 LACTIC ACID, ED    Imaging Review Ct Abdomen Pelvis W Contrast  04/19/2014   CLINICAL DATA:  Worsening severe abdominal pain. Diarrhea. Vomiting. Enteritis.  EXAM: CT ABDOMEN AND PELVIS WITH CONTRAST  TECHNIQUE: Multidetector CT imaging of the abdomen and pelvis was performed using the standard protocol following bolus administration of intravenous contrast.  CONTRAST:  142mL OMNIPAQUE IOHEXOL 300 MG/ML  SOLN  COMPARISON:  04/15/2014  FINDINGS: Mild increase in wall thickening is seen involving a loop of ileum within the right pelvis. There is no evidence of involvement of the terminal ileum. There is no evidence of bowel obstruction. No evidence of abscess or free fluid.  The liver, gallbladder, pancreas, spleen, adrenal glands, and kidneys are normal in appearance. No evidence hydronephrosis. No soft tissue masses or lymphadenopathy identified within the abdomen or pelvis.  IMPRESSION: Mild worsening of enteritis involving loop of ileum within the right pelvis. Differential diagnosis  includes infectious and inflammatory etiologies, hemorrhage, and ischemia.  No evidence of bowel obstruction, abscess, or perforation.   Electronically Signed   By: Earle Gell M.D.   On: 04/19/2014 20:48     EKG Interpretation None      MDM   Final diagnoses:  Ileitis  Abdominal pain  Vomiting      Patient reevaluated:  her pain is improved. It is down to a 3 or 4 with IV pain medication and continued with some nausea. Had an episode of emesis after by mouth contrast. CT scan shows mild worsening of her thickening of her ileitis. No perforation. No obvious sign of obstruction, no abscess. Sedimentation rate is high. Discussion she has worsened despite antibiotics at home. Than usual etiology for her pain with ileitis. Differential is still including infection, ischemia, inflammatory bowel disease, lymphoma/malignancy. Plan will be admission for further evaluation and treatment.   I personally performed the services described in this documentation, which was scribed in my presence. The recorded information has been reviewed and is accurate.      Tanna Furry, MD 04/19/14 3291  Tanna Furry, MD 04/19/14 2214

## 2014-04-20 ENCOUNTER — Encounter (HOSPITAL_COMMUNITY): Payer: Self-pay | Admitting: *Deleted

## 2014-04-20 DIAGNOSIS — I10 Essential (primary) hypertension: Secondary | ICD-10-CM

## 2014-04-20 DIAGNOSIS — K5289 Other specified noninfective gastroenteritis and colitis: Principal | ICD-10-CM

## 2014-04-20 DIAGNOSIS — E119 Type 2 diabetes mellitus without complications: Secondary | ICD-10-CM

## 2014-04-20 LAB — COMPREHENSIVE METABOLIC PANEL WITH GFR
ALT: 12 U/L (ref 0–35)
AST: 18 U/L (ref 0–37)
Albumin: 3.1 g/dL — ABNORMAL LOW (ref 3.5–5.2)
Alkaline Phosphatase: 93 U/L (ref 39–117)
BUN: 5 mg/dL — ABNORMAL LOW (ref 6–23)
CO2: 24 meq/L (ref 19–32)
Calcium: 9.1 mg/dL (ref 8.4–10.5)
Chloride: 106 meq/L (ref 96–112)
Creatinine, Ser: 0.66 mg/dL (ref 0.50–1.10)
GFR calc Af Amer: >90 mL/min
GFR calc non Af Amer: >90 mL/min
Glucose, Bld: 100 mg/dL — ABNORMAL HIGH (ref 70–99)
Potassium: 4 meq/L (ref 3.7–5.3)
Sodium: 143 meq/L (ref 137–147)
Total Bilirubin: 0.2 mg/dL — ABNORMAL LOW (ref 0.3–1.2)
Total Protein: 6.7 g/dL (ref 6.0–8.3)

## 2014-04-20 LAB — GLUCOSE, CAPILLARY
GLUCOSE-CAPILLARY: 97 mg/dL (ref 70–99)
GLUCOSE-CAPILLARY: 86 mg/dL (ref 70–99)
GLUCOSE-CAPILLARY: 117 mg/dL — AB (ref 70–99)
Glucose-Capillary: 124 mg/dL — ABNORMAL HIGH (ref 70–99)
Glucose-Capillary: 150 mg/dL — ABNORMAL HIGH (ref 70–99)
Glucose-Capillary: 93 mg/dL (ref 70–99)

## 2014-04-20 LAB — TSH: TSH: 1.52 u[IU]/mL (ref 0.350–4.500)

## 2014-04-20 LAB — PHOSPHORUS: Phosphorus: 3.3 mg/dL (ref 2.3–4.6)

## 2014-04-20 LAB — HEMOGLOBIN A1C
Hgb A1c MFr Bld: 7.1 % — ABNORMAL HIGH
Mean Plasma Glucose: 157 mg/dL — ABNORMAL HIGH

## 2014-04-20 LAB — CBC
HCT: 34.8 % — ABNORMAL LOW (ref 36.0–46.0)
HEMOGLOBIN: 11.2 g/dL — AB (ref 12.0–15.0)
MCH: 24.1 pg — ABNORMAL LOW (ref 26.0–34.0)
MCHC: 32.2 g/dL (ref 30.0–36.0)
MCV: 74.8 fL — AB (ref 78.0–100.0)
Platelets: 212 10*3/uL (ref 150–400)
RBC: 4.65 MIL/uL (ref 3.87–5.11)
RDW: 16 % — ABNORMAL HIGH (ref 11.5–15.5)
WBC: 6.2 10*3/uL (ref 4.0–10.5)

## 2014-04-20 LAB — MAGNESIUM: Magnesium: 1.7 mg/dL (ref 1.5–2.5)

## 2014-04-20 LAB — CLOSTRIDIUM DIFFICILE BY PCR: Toxigenic C. Difficile by PCR: NEGATIVE

## 2014-04-20 MED ORDER — MORPHINE SULFATE 4 MG/ML IJ SOLN
4.0000 mg | INTRAMUSCULAR | Status: DC | PRN
  Administered 2014-04-20: 4 mg via INTRAVENOUS
  Filled 2014-04-20: qty 1

## 2014-04-20 MED ORDER — SIMVASTATIN 20 MG PO TABS
20.0000 mg | ORAL_TABLET | Freq: Every day | ORAL | Status: DC
  Administered 2014-04-21: 20 mg via ORAL
  Filled 2014-04-20 (×3): qty 1

## 2014-04-20 MED ORDER — INSULIN ASPART 100 UNIT/ML ~~LOC~~ SOLN
0.0000 [IU] | SUBCUTANEOUS | Status: DC
  Administered 2014-04-20 (×2): 1 [IU] via SUBCUTANEOUS
  Administered 2014-04-21: 3 [IU] via SUBCUTANEOUS
  Administered 2014-04-21 (×2): 1 [IU] via SUBCUTANEOUS
  Administered 2014-04-22: 2 [IU] via SUBCUTANEOUS

## 2014-04-20 MED ORDER — ONDANSETRON HCL 4 MG/2ML IJ SOLN
4.0000 mg | Freq: Four times a day (QID) | INTRAMUSCULAR | Status: DC | PRN
Start: 2014-04-20 — End: 2014-04-22
  Filled 2014-04-20: qty 2

## 2014-04-20 MED ORDER — ACETAMINOPHEN 325 MG PO TABS
650.0000 mg | ORAL_TABLET | Freq: Four times a day (QID) | ORAL | Status: DC | PRN
Start: 2014-04-20 — End: 2014-04-22

## 2014-04-20 MED ORDER — ACETAMINOPHEN 650 MG RE SUPP: 650.0000 mg | Freq: Four times a day (QID) | RECTAL | Status: DC | PRN

## 2014-04-20 MED ORDER — HYDROCODONE-ACETAMINOPHEN 5-325 MG PO TABS
1.0000 | ORAL_TABLET | ORAL | Status: DC | PRN
  Administered 2014-04-20 – 2014-04-22 (×7): 2 via ORAL
  Filled 2014-04-20 (×7): qty 2

## 2014-04-20 MED ORDER — SODIUM CHLORIDE 0.9 % IV SOLN
Freq: Once | INTRAVENOUS | Status: AC
  Administered 2014-04-20: 03:00:00 via INTRAVENOUS

## 2014-04-20 MED ORDER — MORPHINE SULFATE 2 MG/ML IJ SOLN: 2.0000 mg | INTRAMUSCULAR | Status: DC | PRN

## 2014-04-20 MED ORDER — METRONIDAZOLE IN NACL 5-0.79 MG/ML-% IV SOLN
500.0000 mg | Freq: Three times a day (TID) | INTRAVENOUS | Status: DC
  Administered 2014-04-20 – 2014-04-22 (×8): 500 mg via INTRAVENOUS
  Filled 2014-04-20 (×10): qty 100

## 2014-04-20 MED ORDER — CLONAZEPAM 1 MG PO TABS: 1.0000 mg | ORAL_TABLET | Freq: Every evening | ORAL | Status: DC | PRN

## 2014-04-20 MED ORDER — CIPROFLOXACIN IN D5W 400 MG/200ML IV SOLN
400.0000 mg | Freq: Two times a day (BID) | INTRAVENOUS | Status: DC
  Administered 2014-04-20 – 2014-04-22 (×6): 400 mg via INTRAVENOUS
  Filled 2014-04-20 (×7): qty 200

## 2014-04-20 MED ORDER — ONDANSETRON HCL 4 MG PO TABS: 4.0000 mg | ORAL_TABLET | Freq: Four times a day (QID) | ORAL | Status: DC | PRN

## 2014-04-20 MED ORDER — CLONAZEPAM 0.5 MG PO TABS: 0.5000 mg | ORAL_TABLET | Freq: Every evening | ORAL | Status: DC | PRN

## 2014-04-20 MED ORDER — PANTOPRAZOLE SODIUM 40 MG PO TBEC
40.0000 mg | DELAYED_RELEASE_TABLET | Freq: Every day | ORAL | Status: DC
  Administered 2014-04-20 – 2014-04-21 (×2): 40 mg via ORAL
  Filled 2014-04-20 (×2): qty 1

## 2014-04-20 NOTE — ED Notes (Signed)
[name] [address] has assumed care of the patient for transport.

## 2014-04-20 NOTE — H&P (Signed)
PCP:  Dwan Bolt, MD    Chief Complaint:  Abdominal pain, nausea and vomiting HPI: Bonnie [name] is a 58 y.o. female   has a past medical history of Hypercholesteremia; Diabetes mellitus; Anemia; Obesity; Anxiety; GERD (gastroesophageal reflux disease); and Angina.   Presented with  For the past 4 days patietn have nad nausea and vomiting with abdominal pain. She presented to Urgent  Care on 6/2 and was diagnosed with ileitis. She was started on cipro and flagyl PO. But did not tolerate due to nausea and vomiting. She presented to Er and had repeat CT of the abdomen that showed worsening ileitis. Patient was transferred to [facility] from [facility].  She reports having diarrhea starting yesterday. Today she have had 6 BM's. Patient reports her husband had a Gi upset 2 weeks ago.   Hospitalist was called for admission for ileitis  Review of Systems:    Pertinent positives include:   abdominal pain, nausea, vomiting, Fevers, chills, diarrhea  Constitutional:  No weight loss, night sweats, fatigue, weight loss  HEENT:  No headaches, Difficulty swallowing,Tooth/dental problems,Sore throat,  No sneezing, itching, ear ache, nasal congestion, post nasal drip,  Cardio-vascular:  No chest pain, Orthopnea, PND, anasarca, dizziness, palpitations.no Bilateral lower extremity swelling  GI:  No heartburn, indigestion,  diarrhea, change in bowel habits, loss of appetite, melena, blood in stool, hematemesis Resp:  no shortness of breath at rest. No dyspnea on exertion, No excess mucus, no productive cough, No non-productive cough, No coughing up of blood.No change in color of mucus.No wheezing. Skin:  no rash or lesions. No jaundice GU:  no dysuria, change in color of urine, no urgency or frequency. No straining to urinate.  No flank pain.  Musculoskeletal:  No joint pain or no joint swelling. No decreased range of motion. No back pain.  Psych:  No change in mood or affect. No depression or  anxiety. No memory loss.  Neuro: no localizing neurological complaints, no tingling, no weakness, no double vision, no gait abnormality, no slurred speech, no confusion  Otherwise ROS are negative except for above, 10 systems were reviewed  Past Medical History: Past Medical History  Diagnosis Date  . Hypercholesteremia   . Diabetes mellitus   . Anemia   . Obesity   . Anxiety   . GERD (gastroesophageal reflux disease)   . Angina    Past Surgical History  Procedure Laterality Date  . Brain surgery    . 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 Brain surgery      For meningioma.  . Abdominal hysterectomy    . Shoulder surgery    . Coccyx removal    . Tonsillectomy    . Bladder surgery       Medications: Prior to Admission medications   Medication Sig Start Date End Date Taking? Authorizing Provider  ciprofloxacin (CIPRO) 500 MG tablet Take 1 tablet (500 mg total) by mouth 2 (two) times daily. One po bid x 7 days 04/16/14   Karen Chafe Molpus, MD  clonazePAM (KLONOPIN) 1 MG tablet Take 1 mg by mouth at bedtime as needed. For sleep    Historical Provider, MD  Dapagliflozin Propanediol (FARXIGA PO) Take 5 mg by mouth daily.     Historical Provider, MD  dexlansoprazole (DEXILANT) 60 MG capsule Take 60 mg by mouth 2 (two) times daily.      Historical Provider, MD  HYDROcodone-acetaminophen (NORCO) 5-325 MG per tablet Take 1 tablet by mouth  every 6 (six) hours as needed. For pain    Historical Provider, MD  HYDROcodone-acetaminophen (NORCO) 5-325 MG per tablet Take 1-2 tablets by mouth every 4 (four) hours as needed. 04/16/14   John L Molpus, MD  Liraglutide (VICTOZA [place]) Inject 1.2 Units into the skin once.      Historical Provider, MD  metFORMIN (GLUCOPHAGE) 1000 MG tablet Take by mouth 2 (two) times daily with a meal.      Historical Provider, MD  metroNIDAZOLE (FLAGYL) 500 MG tablet Take 1 tablet (500 mg total) by mouth 3 (three) times daily. 04/16/14   Karen Chafe Molpus, MD  RAMIPRIL PO Take by  mouth.    Historical Provider, MD  simvastatin (ZOCOR) 40 MG tablet Take 20 mg by mouth at bedtime.     Historical Provider, MD  valACYclovir (VALTREX) 1000 MG tablet Take 1,000 mg by mouth daily as needed. For outbreak    Historical Provider, MD    Allergies:   Allergies  Allergen Reactions  . Biaxin [Clarithromycin] Swelling    Throat swelling   . Ampicillin     Nausea and vomiting  . Carafate [Sucralfate] Nausea And Vomiting    Social History:  Ambulatory  independently   Lives at home  With family     reports that she has quit smoking. She does not have any smokeless tobacco history on file. She reports that she does not drink alcohol or use illicit drugs.    Family History: family history includes Diabetes in her brother and mother; Lung cancer in her father.    Physical Exam: Patient Vitals for the past 24 hrs:  BP Temp Temp src Pulse Resp SpO2 Height Weight  04/20/14 0111 169/74 mmHg 98.8 F (37.1 C) Oral 84 18 100 % 5\' 8"  (1.727 m) 81.9 kg (180 lb 8.9 oz)  04/20/14 0010 144/73 mmHg - - 89 20 98 % - -  04/19/14 2242 152/75 mmHg - - 95 16 98 % - -  04/19/14 2242 152/75 mmHg - - 95 - 98 % - -  04/19/14 2153 172/75 mmHg - - 90 16 100 % - -  04/19/14 1925 141/62 mmHg - - 88 18 100 % - -  04/19/14 1719 122/77 mmHg 98.9 F (37.2 C) Oral 102 20 98 % 5\' 8"  (1.727 m) 80.74 kg (178 lb)    1. General:  in No Acute distress 2. Psychological: Alert and   Oriented 3. Head/ENT:  Dry Mucous Membranes                          Head Non traumatic, neck supple                          Normal Dentition 4. SKIN:  decreased Skin turgor,  Skin clean Dry and intact no rash 5. Heart: Regular rate and rhythm no Murmur, Rub or gallop 6. Lungs: Clear to auscultation bilaterally, no wheezes or crackles   7. Abdomen: Soft, generalized tenderness worse in right lower quadrant, Non distended 8. Lower extremities: no clubbing, cyanosis, or edema 9. Neurologically Grossly intact, moving all  4 extremities equally 10. MSK: Normal range of motion  body mass index is 27.46 kg/(m^2).   Labs on Admission:   Recent Labs  04/19/14 1920  NA 140  K 4.5  CL 102  CO2 27  GLUCOSE 112*  BUN 7  CREATININE 0.70  CALCIUM 9.7   No  results found for this basename: AST, ALT, ALKPHOS, BILITOT, PROT, ALBUMIN,  in the last 72 hours No results found for this basename: LIPASE, AMYLASE,  in the last 72 hours  Recent Labs  04/19/14 1920  WBC 7.0  NEUTROABS 3.8  HGB 12.4  HCT 37.1  MCV 74.6*  PLT 232   No results found for this basename: CKTOTAL, CKMB, CKMBINDEX, TROPONINI,  in the last 72 hours No results found for this basename: TSH, T4TOTAL, FREET3, T3FREE, THYROIDAB,  in the last 72 hours No results found for this basename: VITAMINB12, FOLATE, FERRITIN, TIBC, IRON, RETICCTPCT,  in the last 72 hours No results found for this basename: HGBA1C    Estimated Creatinine Clearance: 85 ml/min (by C-G formula based on Cr of 0.7). ABG    Component Value Date/Time   TCO2 23 01/06/2011 2112     Lab Results  Component Value Date   DDIMER 0.38 07/06/2012     Other results:   BNP (last 3 results) No results found for this basename: PROBNP,  in the last 8760 hours  Filed Weights   04/19/14 1719 04/20/14 0111  Weight: 80.74 kg (178 lb) 81.9 kg (180 lb 8.9 oz)     Cultures: No results found for this basename: sdes, specrequest, cult, reptstatus    Radiological Exams on Admission: Ct Abdomen Pelvis W Contrast  04/19/2014   CLINICAL DATA:  Worsening severe abdominal pain. Diarrhea. Vomiting. Enteritis.  EXAM: CT ABDOMEN AND PELVIS WITH CONTRAST  TECHNIQUE: Multidetector CT imaging of the abdomen and pelvis was performed using the standard protocol following bolus administration of intravenous contrast.  CONTRAST:  132mL OMNIPAQUE IOHEXOL 300 MG/ML  SOLN  COMPARISON:  04/15/2014  FINDINGS: Mild increase in wall thickening is seen involving a loop of ileum within the right pelvis.  There is no evidence of involvement of the terminal ileum. There is no evidence of bowel obstruction. No evidence of abscess or free fluid.  The liver, gallbladder, pancreas, spleen, adrenal glands, and kidneys are normal in appearance. No evidence hydronephrosis. No soft tissue masses or lymphadenopathy identified within the abdomen or pelvis.  IMPRESSION: Mild worsening of enteritis involving loop of ileum within the right pelvis. Differential diagnosis includes infectious and inflammatory etiologies, hemorrhage, and ischemia.  No evidence of bowel obstruction, abscess, or perforation.   Electronically Signed   By: Earle Gell M.D.   On: 04/19/2014 20:48    Chart has been reviewed  Assessment/Plan  58 YO Fwith hx of HTN and DM2  here w with ileitis  Present on Admission:  . Ileitis - will change to cipro and flagyl IV, rehydrate, bowel rest, stool cultures . DM type 2 (diabetes mellitus, type 2) -SSI hold home meds while NPO . Essential hypertension, benign -continue home meds    Prophylaxis: SCD , Protonix  CODE STATUS:  FULL CODE    Other plan as per orders.  I have spent a total of 55 min on this admission  Teagan Heidrick 04/20/2014, 1:48 AM

## 2014-04-20 NOTE — Progress Notes (Signed)
[facility] PROGRESS NOTE  Bonnie [name] HTX:[id number] DOB: [birth year] DOA: 04/19/2014 PCP: Dwan Bolt, MD  Assessment/Plan: 1.  Enteritis/Ileitis -Patient presented with complaints of nausea vomiting and diarrhean -CT scan of abdomen and pelvis showing mild worsening of enteritis involving loop of ileum within the right pelvis. -Likely infectious cause, will provide IV fluids, empiric IV antibiotic therapy with Flagyl and ciprofloxacin, supportive care, reassess in morning.  2. Type 2 diabetes mellitus -As patient was mae n.p.o. she was placed on sliding scale coverage with metformin on hold -Blood sugars remained stable -Continue Accu-Cheks  3. Dyslipidemia Continue statin therapy   Code Status: Full Code Family Communication: Spoke with family members at bedsdie Disposition Plan: Supportive care, emperic IV Ab therapy, advancing diet today   Antibiotics:  Cipro  Flagyl  HPI/Subjective: Patient is a pleasant 58 year old female with a past medical history of type 2 diabetes mellitus, dyslipidemia, admitted to the medicine service on 04/20/2014 presenting with complaints of nausea vomiting and abdominal pain. Patient had been started on oral Cipro Flagyl in the outpatient setting however unable to keep by mouth down due to nausea and vomiting. A CT scan of abdomen and pelvis performed in the emergency department showing mild worsening of enteritis involving loop of ileum. She was brought in for IV fluid hydration and IV antimicrobial therapy this morning she states feeling little better has not had further episodes of nausea vomiting since admission  Objective: Filed Vitals:   04/20/14 1324  BP: 134/70  Pulse: 82  Temp: 98.6 F (37 C)  Resp: 17    Intake/Output Summary (Last 24 hours) at 04/20/14 1414 Last data filed at 04/20/14 1325  Gross per 24 hour  Intake    360 ml  Output      0 ml  Net    360 ml   Filed Weights   04/19/14 1719 04/20/14 0111   Weight: 80.74 kg (178 lb) 81.9 kg (180 lb 8.9 oz)    Exam:   General:  No acute distress, awake and alert, oriented  Cardiovascular: Regular rate and rhythm, normal S1S2  Respiratory: Clear to auscultation  Abdomen: Soft, nontender nondistended  Musculoskeletal: no edema  Data Reviewed: Basic Metabolic Panel:  Recent Labs Lab 04/15/14 2205 04/19/14 1920 04/20/14 0422  NA 144 140 143  K 4.0 4.5 4.0  CL 106 102 106  CO2 25 27 24   GLUCOSE 153* 112* 100*  BUN 9 7 5*  CREATININE 0.70 0.70 0.66  CALCIUM 9.7 9.7 9.1  MG  --   --  1.7  PHOS  --   --  3.3   Liver Function Tests:  Recent Labs Lab 04/15/14 2205 04/20/14 0422  AST 15 18  ALT 10 12  ALKPHOS 104 93  BILITOT 0.2* 0.2*  PROT 7.6 6.7  ALBUMIN 3.9 3.1*    Recent Labs Lab 04/15/14 2205  LIPASE 45   No results found for this basename: AMMONIA,  in the last 168 hours CBC:  Recent Labs Lab 04/15/14 2205 04/19/14 1920 04/20/14 0422  WBC 7.3 7.0 6.2  NEUTROABS 3.9 3.8  --   HGB 11.5* 12.4 11.2*  HCT 34.6* 37.1 34.8*  MCV 75.1* 74.6* 74.8*  PLT 212 232 212   Cardiac Enzymes: No results found for this basename: CKTOTAL, CKMB, CKMBINDEX, TROPONINI,  in the last 168 hours BNP (last 3 results) No results found for this basename: PROBNP,  in the last 8760 hours CBG:  Recent Labs Lab 04/20/14 0406 04/20/14 0721 04/20/14  1147  GLUCAP 97 124* 86    No results found for this or any previous visit (from the past 240 hour(s)).   Studies: Ct Abdomen Pelvis W Contrast  04/19/2014   CLINICAL DATA:  Worsening severe abdominal pain. Diarrhea. Vomiting. Enteritis.  EXAM: CT ABDOMEN AND PELVIS WITH CONTRAST  TECHNIQUE: Multidetector CT imaging of the abdomen and pelvis was performed using the standard protocol following bolus administration of intravenous contrast.  CONTRAST:  146mL OMNIPAQUE IOHEXOL 300 MG/ML  SOLN  COMPARISON:  04/15/2014  FINDINGS: Mild increase in wall thickening is seen involving a  loop of ileum within the right pelvis. There is no evidence of involvement of the terminal ileum. There is no evidence of bowel obstruction. No evidence of abscess or free fluid.  The liver, gallbladder, pancreas, spleen, adrenal glands, and kidneys are normal in appearance. No evidence hydronephrosis. No soft tissue masses or lymphadenopathy identified within the abdomen or pelvis.  IMPRESSION: Mild worsening of enteritis involving loop of ileum within the right pelvis. Differential diagnosis includes infectious and inflammatory etiologies, hemorrhage, and ischemia.  No evidence of bowel obstruction, abscess, or perforation.   Electronically Signed   By: Earle Gell M.D.   On: 04/19/2014 20:48    Scheduled Meds: . ciprofloxacin  400 mg Intravenous Q12H  . insulin aspart  0-9 Units Subcutaneous 6 times per day  . metronidazole  500 mg Intravenous Q8H  . pantoprazole  40 mg Oral Q1200  . simvastatin  20 mg Oral QHS   Continuous Infusions:   Active Problems:   DM type 2 (diabetes mellitus, type 2)   Ileitis   Essential hypertension, benign    Time spent: 30 min    [facility] Pager [phone]. If 7PM-7AM, please contact night-coverage at [email or web address], password [email or web address] 04/20/2014, 2:14 PM  LOS: 1 day

## 2014-04-20 NOTE — ED Notes (Signed)
Patient will be transported via [facility] to [facility] d/t delay in [place] transfer.

## 2014-04-21 DIAGNOSIS — R109 Unspecified abdominal pain: Secondary | ICD-10-CM

## 2014-04-21 LAB — LIPID PANEL
Cholesterol: 110 mg/dL (ref 0–200)
HDL: 50 mg/dL (ref >39)
LDL CALC: 49 mg/dL (ref 0–99)
Total CHOL/HDL Ratio: 2.2 RATIO
Triglycerides: 55 mg/dL (ref <150)
VLDL: 11 mg/dL (ref 0–40)

## 2014-04-21 LAB — CBC
HCT: 33.1 % — ABNORMAL LOW (ref 36.0–46.0)
Hemoglobin: 10.7 g/dL — ABNORMAL LOW (ref 12.0–15.0)
MCH: 23.9 pg — AB (ref 26.0–34.0)
MCHC: 32.3 g/dL (ref 30.0–36.0)
MCV: 73.9 fL — AB (ref 78.0–100.0)
Platelets: 218 10*3/uL (ref 150–400)
RBC: 4.48 MIL/uL (ref 3.87–5.11)
RDW: 15.8 % — AB (ref 11.5–15.5)
WBC: 5.1 10*3/uL (ref 4.0–10.5)

## 2014-04-21 LAB — GLUCOSE, CAPILLARY
GLUCOSE-CAPILLARY: 110 mg/dL — AB (ref 70–99)
GLUCOSE-CAPILLARY: 136 mg/dL — AB (ref 70–99)
Glucose-Capillary: 102 mg/dL — ABNORMAL HIGH (ref 70–99)
Glucose-Capillary: 120 mg/dL — ABNORMAL HIGH (ref 70–99)
Glucose-Capillary: 134 mg/dL — ABNORMAL HIGH (ref 70–99)
Glucose-Capillary: 211 mg/dL — ABNORMAL HIGH (ref 70–99)
Glucose-Capillary: 160 mg/dL — ABNORMAL HIGH (ref 70–99)

## 2014-04-21 LAB — BASIC METABOLIC PANEL
BUN: 5 mg/dL — ABNORMAL LOW (ref 6–23)
CO2: 24 mEq/L (ref 19–32)
Calcium: 9.2 mg/dL (ref 8.4–10.5)
Chloride: 103 mEq/L (ref 96–112)
Creatinine, Ser: 0.63 mg/dL (ref 0.50–1.10)
GFR calc Af Amer: >90 mL/min (ref >90)
Glucose, Bld: 129 mg/dL — ABNORMAL HIGH (ref 70–99)
Potassium: 4 mEq/L (ref 3.7–5.3)
SODIUM: 141 meq/L (ref 137–147)

## 2014-04-21 MED ORDER — SODIUM CHLORIDE 0.9 % IV SOLN
INTRAVENOUS | Status: DC
  Administered 2014-04-21: 14:00:00 via INTRAVENOUS

## 2014-04-21 MED ORDER — SODIUM CHLORIDE 0.9 % IV SOLN
INTRAVENOUS | Status: DC
  Administered 2014-04-22: 13:00:00 via INTRAVENOUS

## 2014-04-21 MED ORDER — PEG 3350-KCL-NA BICARB-NACL 420 G PO SOLR
4000.0000 mL | Freq: Once | ORAL | Status: AC
  Administered 2014-04-21: 4000 mL via ORAL
  Filled 2014-04-21: qty 4000

## 2014-04-21 NOTE — Consult Note (Addendum)
UNASSIGNED PATIENT Reason for Consult: Ileitis on CT. Referring Physician: [facility]-Dr Larwance Rote Bonnie [name] is an 58 y.o. female.  HPI: 58 year old black female, with multiple medical problems listed below, admitted with a 4 day history of diarrhea following a week of constipation. She denies having any melena or hematochezia. She has been having normal BM's till a week prior to admission. CT scan on admission revealed ileitis. She is not sure but thinks her mother had Crohn's disease. She ahs been on PPI for about 5 years.   Past Medical History  Diagnosis Date  . Hypercholesteremia   . Diabetes mellitus   . Anemia   . Obesity   . Anxiety   . GERD (gastroesophageal reflux disease)   . Angina    Past Surgical History  Procedure Laterality Date  . Brain surgery    . 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 Brain surgery      For meningioma.  . Abdominal hysterectomy    . Shoulder surgery    . Coccyx removal    . Tonsillectomy    . Bladder surgery     Family History  Problem Relation Age of Onset  . Diabetes Mother   . Lung cancer Father   . Diabetes Brother    Social History:  reports that she has quit smoking. She does not have any smokeless tobacco history on file. She reports that she does not drink alcohol or use illicit drugs.  Allergies:  Allergies  Allergen Reactions  . Biaxin [Clarithromycin] Swelling    Throat swelling   . Ampicillin Nausea And Vomiting  . Carafate [Sucralfate] Nausea And Vomiting  . Metronidazole Nausea And Vomiting   Medications: I have reviewed the patient's current medications.  Results for orders placed during the hospital encounter of 04/19/14 (from the past 48 hour(s))  CBC WITH DIFFERENTIAL     Status: Abnormal   Collection Time    04/19/14  7:20 PM      Result Value Ref Range   WBC 7.0  4.0 - 10.5 K/uL   RBC 4.97  3.87 - 5.11 MIL/uL   Hemoglobin 12.4  12.0 - 15.0 g/dL   HCT 37.1  36.0 - 46.0 %   MCV 74.6 (*) 78.0 - 100.0 fL   MCH 24.9  (*) 26.0 - 34.0 pg   MCHC 33.4  30.0 - 36.0 g/dL   RDW 16.2 (*) 11.5 - 15.5 %   Platelets 232  150 - 400 K/uL   Neutrophils Relative % 55  43 - 77 %   Neutro Abs 3.8  1.7 - 7.7 K/uL   Lymphocytes Relative 28  12 - 46 %   Lymphs Abs 1.9  0.7 - 4.0 K/uL   Monocytes Relative 16 (*) 3 - 12 %   Monocytes Absolute 1.1 (*) 0.1 - 1.0 K/uL   Eosinophils Relative 1  0 - 5 %   Eosinophils Absolute 0.1  0.0 - 0.7 K/uL   Basophils Relative 0  0 - 1 %   Basophils Absolute 0.0  0.0 - 0.1 K/uL  BASIC METABOLIC PANEL     Status: Abnormal   Collection Time    04/19/14  7:20 PM      Result Value Ref Range   Sodium 140  137 - 147 mEq/L   Potassium 4.5  3.7 - 5.3 mEq/L   Chloride 102  96 - 112 mEq/L   CO2 27  19 - 32 mEq/L  Glucose, Bld 112 (*) 70 - 99 mg/dL   BUN 7  6 - 23 mg/dL   Creatinine, Ser 0.70  0.50 - 1.10 mg/dL   Calcium 9.7  8.4 - 10.5 mg/dL   GFR calc non Af Amer >90  >90 mL/min   GFR calc Af Amer >90  >90 mL/min   Comment: (NOTE)     The eGFR has been calculated using the CKD EPI equation.     This calculation has not been validated in all clinical situations.     eGFR's persistently <90 mL/min signify possible Chronic Kidney     Disease.  SEDIMENTATION RATE     Status: None   Collection Time    04/19/14  7:20 PM      Result Value Ref Range   Sed Rate 21  0 - 22 mm/hr  I-STAT CG4 LACTIC ACID, ED     Status: None   Collection Time    04/19/14  7:37 PM      Result Value Ref Range   Lactic Acid, Venous 1.22  0.5 - 2.2 mmol/L  GLUCOSE, CAPILLARY     Status: None   Collection Time    04/20/14  4:06 AM      Result Value Ref Range   Glucose-Capillary 97  70 - 99 mg/dL   Comment 1 Notify RN    HEMOGLOBIN A1C     Status: Abnormal   Collection Time    04/20/14  4:22 AM      Result Value Ref Range   Hemoglobin A1C 7.1 (*) <5.7 %   Comment: (NOTE)                                                                               According to the ADA Clinical Practice Recommendations  for 2011, when     HbA1c is used as a screening test:      >=6.5%   Diagnostic of Diabetes Mellitus               (if abnormal result is confirmed)     5.7-6.4%   Increased risk of developing Diabetes Mellitus     References:Diagnosis and Classification of Diabetes Mellitus,Diabetes     ZSWF,0932,35(TDDUK 1):S62-S69 and Standards of Medical Care in             Diabetes - 2011,Diabetes Care,2011,34 (Suppl 1):S11-S61.   Mean Plasma Glucose 157 (*) <117 mg/dL   Comment: Performed at [facility]     Status: None   Collection Time    04/20/14  4:22 AM      Result Value Ref Range   Magnesium 1.7  1.5 - 2.5 mg/dL  PHOSPHORUS     Status: None   Collection Time    04/20/14  4:22 AM      Result Value Ref Range   Phosphorus 3.3  2.3 - 4.6 mg/dL  TSH     Status: None   Collection Time    04/20/14  4:22 AM      Result Value Ref Range   TSH 1.520  0.350 - 4.500 uIU/mL  COMPREHENSIVE METABOLIC PANEL     Status: Abnormal   Collection  Time    04/20/14  4:22 AM      Result Value Ref Range   Sodium 143  137 - 147 mEq/L   Potassium 4.0  3.7 - 5.3 mEq/L   Chloride 106  96 - 112 mEq/L   CO2 24  19 - 32 mEq/L   Glucose, Bld 100 (*) 70 - 99 mg/dL   BUN 5 (*) 6 - 23 mg/dL   Creatinine, Ser 0.66  0.50 - 1.10 mg/dL   Calcium 9.1  8.4 - 10.5 mg/dL   Total Protein 6.7  6.0 - 8.3 g/dL   Albumin 3.1 (*) 3.5 - 5.2 g/dL   AST 18  0 - 37 U/L   ALT 12  0 - 35 U/L   Alkaline Phosphatase 93  39 - 117 U/L   Total Bilirubin 0.2 (*) 0.3 - 1.2 mg/dL   GFR calc non Af Amer >90  >90 mL/min   GFR calc Af Amer >90  >90 mL/min   Comment: (NOTE)     The eGFR has been calculated using the CKD EPI equation.     This calculation has not been validated in all clinical situations.     eGFR's persistently <90 mL/min signify possible Chronic Kidney     Disease.  CBC     Status: Abnormal   Collection Time    04/20/14  4:22 AM      Result Value Ref Range   WBC 6.2  4.0 - 10.5 K/uL   RBC 4.65  3.87  - 5.11 MIL/uL   Hemoglobin 11.2 (*) 12.0 - 15.0 g/dL   HCT 34.8 (*) 36.0 - 46.0 %   MCV 74.8 (*) 78.0 - 100.0 fL   MCH 24.1 (*) 26.0 - 34.0 pg   MCHC 32.2  30.0 - 36.0 g/dL   RDW 16.0 (*) 11.5 - 15.5 %   Platelets 212  150 - 400 K/uL  GLUCOSE, CAPILLARY     Status: Abnormal   Collection Time    04/20/14  7:21 AM      Result Value Ref Range   Glucose-Capillary 124 (*) 70 - 99 mg/dL  GLUCOSE, CAPILLARY     Status: None   Collection Time    04/20/14 11:47 AM      Result Value Ref Range   Glucose-Capillary 86  70 - 99 mg/dL  GLUCOSE, CAPILLARY     Status: Abnormal   Collection Time    04/20/14  3:34 PM      Result Value Ref Range   Glucose-Capillary 117 (*) 70 - 99 mg/dL  CLOSTRIDIUM DIFFICILE BY PCR     Status: None   Collection Time    04/20/14  5:18 PM      Result Value Ref Range   C difficile by pcr NEGATIVE  NEGATIVE  GLUCOSE, CAPILLARY     Status: Abnormal   Collection Time    04/20/14  7:39 PM      Result Value Ref Range   Glucose-Capillary 150 (*) 70 - 99 mg/dL   Comment 1 Notify RN    GLUCOSE, CAPILLARY     Status: None   Collection Time    04/20/14 11:41 PM      Result Value Ref Range   Glucose-Capillary 93  70 - 99 mg/dL   Comment 1 Notify RN    GLUCOSE, CAPILLARY     Status: Abnormal   Collection Time    04/21/14  2:24 AM      Result Value Ref  Range   Glucose-Capillary 102 (*) 70 - 99 mg/dL   Comment 1 Notify RN    GLUCOSE, CAPILLARY     Status: Abnormal   Collection Time    04/21/14  4:01 AM      Result Value Ref Range   Glucose-Capillary 120 (*) 70 - 99 mg/dL   Comment 1 Notify RN    BASIC METABOLIC PANEL     Status: Abnormal   Collection Time    04/21/14  5:34 AM      Result Value Ref Range   Sodium 141  137 - 147 mEq/L   Potassium 4.0  3.7 - 5.3 mEq/L   Chloride 103  96 - 112 mEq/L   CO2 24  19 - 32 mEq/L   Glucose, Bld 129 (*) 70 - 99 mg/dL   BUN 5 (*) 6 - 23 mg/dL   Creatinine, Ser 0.63  0.50 - 1.10 mg/dL   Calcium 9.2  8.4 - 10.5 mg/dL    GFR calc non Af Amer >90  >90 mL/min   GFR calc Af Amer >90  >90 mL/min   Comment: (NOTE)     The eGFR has been calculated using the CKD EPI equation.     This calculation has not been validated in all clinical situations.     eGFR's persistently <90 mL/min signify possible Chronic Kidney     Disease.  CBC     Status: Abnormal   Collection Time    04/21/14  5:34 AM      Result Value Ref Range   WBC 5.1  4.0 - 10.5 K/uL   RBC 4.48  3.87 - 5.11 MIL/uL   Hemoglobin 10.7 (*) 12.0 - 15.0 g/dL   HCT 33.1 (*) 36.0 - 46.0 %   MCV 73.9 (*) 78.0 - 100.0 fL   MCH 23.9 (*) 26.0 - 34.0 pg   MCHC 32.3  30.0 - 36.0 g/dL   RDW 15.8 (*) 11.5 - 15.5 %   Platelets 218  150 - 400 K/uL  LIPID PANEL     Status: None   Collection Time    04/21/14  5:34 AM      Result Value Ref Range   Cholesterol 110  0 - 200 mg/dL   Triglycerides 55  <150 mg/dL   HDL 50  >39 mg/dL   Total CHOL/HDL Ratio 2.2     VLDL 11  0 - 40 mg/dL   LDL Cholesterol 49  0 - 99 mg/dL   Comment:            Total Cholesterol/HDL:CHD Risk     Coronary Heart Disease Risk Table                         Men   Women      1/2 Average Risk   3.4   3.3      Average Risk       5.0   4.4      2 X Average Risk   9.6   7.1      3 X Average Risk  23.4   11.0                Use the calculated Patient Ratio     above and the CHD Risk Table     to determine the patient's CHD Risk.                ATP III CLASSIFICATION (LDL):      <  100     mg/dL   Optimal      100-129  mg/dL   Near or Above                        Optimal      130-159  mg/dL   Borderline      160-189  mg/dL   High      >190     mg/dL   Very High  GLUCOSE, CAPILLARY     Status: Abnormal   Collection Time    04/21/14  7:57 AM      Result Value Ref Range   Glucose-Capillary 136 (*) 70 - 99 mg/dL  GLUCOSE, CAPILLARY     Status: Abnormal   Collection Time    04/21/14 11:53 AM      Result Value Ref Range   Glucose-Capillary 134 (*) 70 - 99 mg/dL  GLUCOSE, CAPILLARY      Status: Abnormal   Collection Time    04/21/14  4:18 PM      Result Value Ref Range   Glucose-Capillary 211 (*) 70 - 99 mg/dL   Ct Abdomen Pelvis W Contrast  04/19/2014   CLINICAL DATA:  Worsening severe abdominal pain. Diarrhea. Vomiting. Enteritis.  EXAM: CT ABDOMEN AND PELVIS WITH CONTRAST  TECHNIQUE: Multidetector CT imaging of the abdomen and pelvis was performed using the standard protocol following bolus administration of intravenous contrast.  CONTRAST:  117m OMNIPAQUE IOHEXOL 300 MG/ML  SOLN  COMPARISON:  04/15/2014  FINDINGS: Mild increase in wall thickening is seen involving a loop of ileum within the right pelvis. There is no evidence of involvement of the terminal ileum. There is no evidence of bowel obstruction. No evidence of abscess or free fluid.  The liver, gallbladder, pancreas, spleen, adrenal glands, and kidneys are normal in appearance. No evidence hydronephrosis. No soft tissue masses or lymphadenopathy identified within the abdomen or pelvis.  IMPRESSION: Mild worsening of enteritis involving loop of ileum within the right pelvis. Differential diagnosis includes infectious and inflammatory etiologies, hemorrhage, and ischemia.  No evidence of bowel obstruction, abscess, or perforation.   Electronically Signed   By: JEarle GellM.D.   On: 04/19/2014 20:48   Review of Systems  Constitutional: Negative.   HENT: Negative.   Eyes: Negative.   Respiratory: Negative.   Cardiovascular: Negative.   Gastrointestinal: Positive for heartburn, abdominal pain and diarrhea. Negative for nausea, vomiting, blood in stool and melena.  Genitourinary: Negative.   Skin: Negative.   Neurological: Negative.   Endo/Heme/Allergies: Negative.   Psychiatric/Behavioral: The patient is nervous/anxious.    Blood pressure 113/63, pulse 73, temperature 98.6 F (37 C), temperature source Oral, resp. rate 16, height _0  (1.727 m), weight 81.9 kg (180 lb 8.9 oz), SpO2 99.00%. Physical Exam   Constitutional: She is oriented to person, place, and time. She appears well-developed and well-nourished.  HENT:  Head: Normocephalic.  Eyes: Conjunctivae and EOM are normal. Pupils are equal, round, and reactive to light.  Neck: Normal range of motion.  Cardiovascular: Normal rate and regular rhythm.   Respiratory: Effort normal and breath sounds normal.  GI: Soft. Bowel sounds are normal. There is no hepatosplenomegaly. There is tenderness in the right lower quadrant. There is no rebound and no CVA tenderness.  Musculoskeletal: Normal range of motion.  Neurological: She is alert and oriented to person, place, and time.  Skin: Skin is warm and dry.  Psychiatric: She has a normal mood and  affect. Her behavior is normal. Judgment and thought content normal.   Assessment/Plan: 1) RLQ pain with ileitis on CT/Mild anemia/change in bowel habits: Will prep for a colonoscopy tomorrow.  2) GERD on PPI's.  Juanita Craver 04/21/2014, 5:25 PM

## 2014-04-21 NOTE — Progress Notes (Signed)
[facility] PROGRESS NOTE  Bonnie [name] MNO:[id number] DOB: 06/04/1956 DOA: 04/19/2014 PCP: Dwan Bolt, MD  Assessment/Plan: 1.  Enteritis/Ileitis -Patient presented with complaints of nausea vomiting and diarrhean -CT scan of abdomen and pelvis showing mild worsening of enteritis involving loop of ileum within the right pelvis. -Patient having her last colonoscopy over 20 years ago and states it was normal. I discussed case with Dr Collene Mares of GI about possibility of doing colonoscopy during this hospitalization, as ileitis could be seen in Crohn's.   2. Type 2 diabetes mellitus -As patient was mae n.p.o. she was placed on sliding scale coverage with metformin on hold -Blood sugars remained stable -Continue Accu-Cheks  3. Dyslipidemia Continue statin therapy   Code Status: Full Code Family Communication: Spoke with family members at bedsdie Disposition Plan: Per Dr Collene Mares placing patient on clears, will likely undergo colonoscopy tomorrow.    Antibiotics:  Cipro  Flagyl  HPI/Subjective: Patient is a pleasant 58 year old female with a past medical history of type 2 diabetes mellitus, dyslipidemia, admitted to the medicine service on 04/20/2014 presenting with complaints of nausea vomiting and abdominal pain. Patient had been started on oral Cipro Flagyl in the outpatient setting however unable to keep by mouth down due to nausea and vomiting. A CT scan of abdomen and pelvis performed in the emergency department showing mild worsening of enteritis involving loop of ileum. She was brought in for IV fluid hydration and IV antimicrobial therapy this morning she states feeling little better has not had further episodes of nausea vomiting since admission  Objective: Filed Vitals:   04/21/14 1251  BP: 113/63  Pulse: 73  Temp: 98.6 F (37 C)  Resp: 16    Intake/Output Summary (Last 24 hours) at 04/21/14 1336 Last data filed at 04/21/14 0900  Gross per 24 hour  Intake    1340 ml  Output      0 ml  Net   1340 ml   Filed Weights   04/19/14 1719 04/20/14 0111  Weight: 80.74 kg (178 lb) 81.9 kg (180 lb 8.9 oz)    Exam:   General:  No acute distress, awake and alert, oriented  Cardiovascular: Regular rate and rhythm, normal S1S2  Respiratory: Clear to auscultation  Abdomen: Soft, nontender nondistended  Musculoskeletal: no edema  Data Reviewed: Basic Metabolic Panel:  Recent Labs Lab 04/15/14 2205 04/19/14 1920 04/20/14 0422 04/21/14 0534  NA 144 140 143 141  K 4.0 4.5 4.0 4.0  CL 106 102 106 103  CO2 25 27 24 24   GLUCOSE 153* 112* 100* 129*  BUN 9 7 5* 5*  CREATININE 0.70 0.70 0.66 0.63  CALCIUM 9.7 9.7 9.1 9.2  MG  --   --  1.7  --   PHOS  --   --  3.3  --    Liver Function Tests:  Recent Labs Lab 04/15/14 2205 04/20/14 0422  AST 15 18  ALT 10 12  ALKPHOS 104 93  BILITOT 0.2* 0.2*  PROT 7.6 6.7  ALBUMIN 3.9 3.1*    Recent Labs Lab 04/15/14 2205  LIPASE 45   No results found for this basename: AMMONIA,  in the last 168 hours CBC:  Recent Labs Lab 04/15/14 2205 04/19/14 1920 04/20/14 0422 04/21/14 0534  WBC 7.3 7.0 6.2 5.1  NEUTROABS 3.9 3.8  --   --   HGB 11.5* 12.4 11.2* 10.7*  HCT 34.6* 37.1 34.8* 33.1*  MCV 75.1* 74.6* 74.8* 73.9*  PLT 212 232 212 218  Cardiac Enzymes: No results found for this basename: CKTOTAL, CKMB, CKMBINDEX, TROPONINI,  in the last 168 hours BNP (last 3 results) No results found for this basename: PROBNP,  in the last 8760 hours CBG:  Recent Labs Lab 04/20/14 2341 04/21/14 0224 04/21/14 0401 04/21/14 0757 04/21/14 1153  GLUCAP 93 102* 120* 136* 134*    Recent Results (from the past 240 hour(s))  CLOSTRIDIUM DIFFICILE BY PCR     Status: None   Collection Time    04/20/14  5:18 PM      Result Value Ref Range Status   C difficile by pcr NEGATIVE  NEGATIVE Final     Studies: Ct Abdomen Pelvis W Contrast  04/19/2014   CLINICAL DATA:  Worsening severe abdominal pain.  Diarrhea. Vomiting. Enteritis.  EXAM: CT ABDOMEN AND PELVIS WITH CONTRAST  TECHNIQUE: Multidetector CT imaging of the abdomen and pelvis was performed using the standard protocol following bolus administration of intravenous contrast.  CONTRAST:  169mL OMNIPAQUE IOHEXOL 300 MG/ML  SOLN  COMPARISON:  04/15/2014  FINDINGS: Mild increase in wall thickening is seen involving a loop of ileum within the right pelvis. There is no evidence of involvement of the terminal ileum. There is no evidence of bowel obstruction. No evidence of abscess or free fluid.  The liver, gallbladder, pancreas, spleen, adrenal glands, and kidneys are normal in appearance. No evidence hydronephrosis. No soft tissue masses or lymphadenopathy identified within the abdomen or pelvis.  IMPRESSION: Mild worsening of enteritis involving loop of ileum within the right pelvis. Differential diagnosis includes infectious and inflammatory etiologies, hemorrhage, and ischemia.  No evidence of bowel obstruction, abscess, or perforation.   Electronically Signed   By: Earle Gell M.D.   On: 04/19/2014 20:48    Scheduled Meds: . ciprofloxacin  400 mg Intravenous Q12H  . insulin aspart  0-9 Units Subcutaneous 6 times per day  . metronidazole  500 mg Intravenous Q8H  . pantoprazole  40 mg Oral Q1200  . simvastatin  20 mg Oral QHS   Continuous Infusions:   Active Problems:   DM type 2 (diabetes mellitus, type 2)   Ileitis   Essential hypertension, benign    Time spent: 30 min    [facility] Pager [phone]. If 7PM-7AM, please contact night-coverage at [email or web address], password [facility] 04/21/2014, 1:36 PM  LOS: 2 days

## 2014-04-22 ENCOUNTER — Encounter (HOSPITAL_COMMUNITY)
Admission: EM | Disposition: A | Discharge status: Home / Self Care | Payer: Self-pay | Source: Home / Self Care | Attending: Internal Medicine

## 2014-04-22 ENCOUNTER — Encounter (HOSPITAL_COMMUNITY): Payer: Self-pay

## 2014-04-22 DIAGNOSIS — R197 Diarrhea, unspecified: Secondary | ICD-10-CM | POA: Diagnosis present

## 2014-04-22 DIAGNOSIS — R112 Nausea with vomiting, unspecified: Secondary | ICD-10-CM | POA: Diagnosis present

## 2014-04-22 DIAGNOSIS — E7800 Pure hypercholesterolemia, unspecified: Secondary | ICD-10-CM

## 2014-04-22 HISTORY — PX: COLONOSCOPY: SHX5424

## 2014-04-22 LAB — GLUCOSE, CAPILLARY
GLUCOSE-CAPILLARY: 136 mg/dL — AB (ref 70–99)
Glucose-Capillary: 157 mg/dL — ABNORMAL HIGH (ref 70–99)
Glucose-Capillary: 92 mg/dL (ref 70–99)
Glucose-Capillary: 103 mg/dL — ABNORMAL HIGH (ref 70–99)

## 2014-04-22 SURGERY — COLONOSCOPY
Anesthesia: Moderate Sedation | Laterality: Left

## 2014-04-22 MED ORDER — METRONIDAZOLE 500 MG PO TABS: 500.0000 mg | ORAL_TABLET | Freq: Three times a day (TID) | ORAL | Status: DC

## 2014-04-22 MED ORDER — FENTANYL CITRATE 0.05 MG/ML IJ SOLN
INTRAMUSCULAR | Status: AC
  Filled 2014-04-22: qty 2

## 2014-04-22 MED ORDER — FENTANYL CITRATE 0.05 MG/ML IJ SOLN
INTRAMUSCULAR | Status: DC | PRN
  Administered 2014-04-22 (×7): 25 ug via INTRAVENOUS

## 2014-04-22 MED ORDER — MIDAZOLAM HCL 5 MG/ML IJ SOLN
INTRAMUSCULAR | Status: AC
  Filled 2014-04-22: qty 2

## 2014-04-22 MED ORDER — MIDAZOLAM HCL 5 MG/5ML IJ SOLN
INTRAMUSCULAR | Status: DC | PRN
  Administered 2014-04-22 (×5): 2 mg via INTRAVENOUS

## 2014-04-22 MED ORDER — CLONAZEPAM 0.5 MG PO TABS: 0.5000 mg | ORAL_TABLET | Freq: Every evening | ORAL | Status: DC | PRN

## 2014-04-22 MED ORDER — CIPROFLOXACIN HCL 500 MG PO TABS: 500.0000 mg | ORAL_TABLET | Freq: Two times a day (BID) | ORAL | Status: DC

## 2014-04-22 NOTE — Discharge Summary (Signed)
Physician Discharge Summary  Bonnie [name] MGN:[id number] DOB: 1956-05-24 DOA: 04/19/2014  PCP: Bonnie Bolt, MD  Admit date: 04/19/2014 Discharge date: 04/22/2014  Time spent: 35 minutes  Recommendations for Outpatient Follow-up:  1. Please follow up on Biopsy from Colonoscopy performed on 04/22/2014  Discharge Diagnoses:  Principal Problem:   Ileitis Active Problems:   Nausea with vomiting   Diarrhea   DM type 2 (diabetes mellitus, type 2)   Essential hypertension, benign   Discharge Condition: Stable  Diet recommendation: Heart Healthy  Filed Weights   04/19/14 1719 04/20/14 0111  Weight: 80.74 kg (178 lb) 81.9 kg (180 lb 8.9 oz)    History of present illness:  Bonnie [name] is a 58 y.o. female  has a past medical history of Hypercholesteremia; Diabetes mellitus; Anemia; Obesity; Anxiety; GERD (gastroesophageal reflux disease); and Angina.  Presented with  For the past 4 days patietn have nad nausea and vomiting with abdominal pain. She presented to Urgent Care on 6/2 and was diagnosed with ileitis. She was started on cipro and flagyl PO. But did not tolerate due to nausea and vomiting. She presented to Er and had repeat CT of the abdomen that showed worsening ileitis.  Patient was transferred to [facility] from [facility].  She reports having diarrhea starting yesterday. Today she have had 6 BM's. Patient reports her husband had a Gi upset 2 weeks ago.    Hospital Course:  1. Enteritis/Ileitis  -Patient presented with complaints of nausea vomiting and diarrhean  -CT scan of abdomen and pelvis showing mild worsening of enteritis involving loop of ileum within the right pelvis.  -Patient having her last colonoscopy over 20 years ago and states it was normal. I discussed case with Bonnie [name] of GI about possibility of doing colonoscopy during this hospitalization, as ileitis could be seen in Crohn's. -Patient undergoing Colonoscopy on 04/22/2014 which was normal. Biopsy results will need to be  followed.  2. Type 2 diabetes mellitus  -Blood sugars remained stable  -Discharged on her home regimen 3. Dyslipidemia  -Continue statin therapy   Procedures:  Colonoscopy performed on 04/22/2014  Consultations:  GI  Discharge Exam: Filed Vitals:   04/22/14 1526  BP: 129/79  Pulse: 68  Temp: 97.9 F (36.6 C)  Resp: 16    General: No acute distress, reports feeling better, tolerating PO Cardiovascular: Regular rate and rhythm, normal S1S2 Respiratory: Clear to auscultation, normal inspiratory effort Abdomen: Soft, nontender nondistended  Discharge Instructions You were cared for by a hospitalist during your hospital stay. If you have any questions about your discharge medications or the care you received while you were in the hospital after you are discharged, you can call the unit and asked to speak with the hospitalist on call if the hospitalist that took care of you is not available. Once you are discharged, your primary care physician will handle any further medical issues. Please note that NO REFILLS for any discharge medications will be authorized once you are discharged, as it is imperative that you return to your primary care physician (or establish a relationship with a primary care physician if you do not have one) for your aftercare needs so that they can reassess your need for medications and monitor your lab values.      Discharge Instructions   Call MD for:  difficulty breathing, headache or visual disturbances    Complete by:  As directed      Call MD for:  extreme fatigue    Complete  by:  As directed      Call MD for:  persistant dizziness or light-headedness    Complete by:  As directed      Call MD for:  persistant nausea and vomiting    Complete by:  As directed      Call MD for:  severe uncontrolled pain    Complete by:  As directed      Call MD for:  temperature >100.4    Complete by:  As directed      Diet - low sodium heart healthy    Complete by:   As directed      Increase activity slowly    Complete by:  As directed             Medication List         ciprofloxacin 500 MG tablet  Commonly known as:  CIPRO  Take 1 tablet (500 mg total) by mouth 2 (two) times daily. One po bid x 7 days     clonazePAM 0.5 MG tablet  Commonly known as:  KLONOPIN  Take 1 tablet (0.5 mg total) by mouth at bedtime as needed (insomnia).     DEXILANT 60 MG capsule  Generic drug:  dexlansoprazole  Take 60 mg by mouth daily.     FARXIGA 10 MG Tabs  Generic drug:  Dapagliflozin Propanediol  Take 5 mg by mouth daily.     HYDROcodone-acetaminophen 5-325 MG per tablet  Commonly known as:  NORCO/VICODIN  Take 0.5-2 tablets by mouth 3 (three) times daily as needed. 1/2 tablet for mild pain, 1 tablet for moderate pain, 2 tablets for severe pain     metFORMIN 1000 MG tablet  Commonly known as:  GLUCOPHAGE  Take 1,000 mg by mouth 2 (two) times daily with a meal.     metroNIDAZOLE 500 MG tablet  Commonly known as:  FLAGYL  Take 1 tablet (500 mg total) by mouth 3 (three) times daily.     ramipril 5 MG capsule  Commonly known as:  ALTACE  Take 5 mg by mouth daily.     REFRESH 1 % ophthalmic solution  Generic drug:  carboxymethylcellulose  Place 1 drop into the left eye at bedtime.     REFRESH TEARS 0.5 % Soln  Generic drug:  carboxymethylcellulose  1 drop See admin instructions. Instill 1 drop in both eyes every morning and 1 drop in right eye every night at bedtime     simvastatin 40 MG tablet  Commonly known as:  ZOCOR  Take 20 mg by mouth daily at 6 PM.     valACYclovir 1000 MG tablet  Commonly known as:  VALTREX  Take 500 mg by mouth daily. For outbreak     VICTOZA 18 MG/3ML Sopn  Generic drug:  Liraglutide  Inject 7.2 mg into the skin daily. 1.2 mls     vitamin C 500 MG tablet  Commonly known as:  ASCORBIC ACID  Take 500 mg by mouth daily.       Allergies  Allergen Reactions  . Biaxin [Clarithromycin] Swelling    Throat  swelling   . Ampicillin Nausea And Vomiting  . Carafate [Sucralfate] Nausea And Vomiting  . Metronidazole Nausea And Vomiting   Follow-up Information   Follow up with Bonnie Bolt, MD In 1 week.   Specialty:  Endocrinology   Contact information:   [address] [place] [address] [phone]       Follow up with HUNG,PATRICK D,  MD In 2 weeks.   Specialty:  Gastroenterology   Contact information:   [address] [place]  [address] [phone]        The results of significant diagnostics from this hospitalization (including imaging, microbiology, ancillary and laboratory) are listed below for reference.    Significant Diagnostic Studies: Ct Abdomen Pelvis W Contrast  04/19/2014   CLINICAL DATA:  Worsening severe abdominal pain. Diarrhea. Vomiting. Enteritis.  EXAM: CT ABDOMEN AND PELVIS WITH CONTRAST  TECHNIQUE: Multidetector CT imaging of the abdomen and pelvis was performed using the standard protocol following bolus administration of intravenous contrast.  CONTRAST:  139mL OMNIPAQUE IOHEXOL 300 MG/ML  SOLN  COMPARISON:  04/15/2014  FINDINGS: Mild increase in wall thickening is seen involving a loop of ileum within the right pelvis. There is no evidence of involvement of the terminal ileum. There is no evidence of bowel obstruction. No evidence of abscess or free fluid.  The liver, gallbladder, pancreas, spleen, adrenal glands, and kidneys are normal in appearance. No evidence hydronephrosis. No soft tissue masses or lymphadenopathy identified within the abdomen or pelvis.  IMPRESSION: Mild worsening of enteritis involving loop of ileum within the right pelvis. Differential diagnosis includes infectious and inflammatory etiologies, hemorrhage, and ischemia.  No evidence of bowel obstruction, abscess, or perforation.   Electronically Signed   By: Earle Gell M.D.   On: 04/19/2014 20:48   Ct Abdomen Pelvis W Contrast  04/15/2014   CLINICAL  DATA:  Abdominal pain.  EXAM: CT ABDOMEN AND PELVIS WITH CONTRAST  TECHNIQUE: Multidetector CT imaging of the abdomen and pelvis was performed using the standard protocol following bolus administration of intravenous contrast.  CONTRAST:  57mL OMNIPAQUE IOHEXOL 300 MG/ML SOLN, 159mL OMNIPAQUE IOHEXOL 300 MG/ML SOLN  COMPARISON:  03/12/2012  FINDINGS: BODY WALL: Unremarkable.  LOWER CHEST: Unremarkable.  ABDOMEN/PELVIS:  Liver: No focal abnormality.  Biliary: No evidence of biliary obstruction or stone.  Pancreas: Unremarkable.  Spleen: Unremarkable.  Adrenals: Unremarkable.  Kidneys and ureters: No hydronephrosis or stone.  Bladder: Unremarkable.  Reproductive: Hysterectomy.  Bowel: There is a short segment of marked submucosal edema and mucosal hyper enhancement involving bowel in the right lower quadrant. The affected segment is in continuity with the ileum (rather than a Meckel's diverticulum). No bowel obstruction. Normal appendix.  Retroperitoneum: No mass or adenopathy.  Peritoneum: No free fluid or gas.  Vascular: No acute abnormality. Aortic and branch vessel atherosclerosis.  OSSEOUS: L4-5 and L5-S1 posterior lumbar interbody fusion with rod and pedicle screw fixation. There is complete bony fusion across the disc level.  IMPRESSION: Short-segment ileitis which could be infectious, autoimmune, or vascular. No obstruction or perforation.   Electronically Signed   By: Jorje Guild M.D.   On: 04/15/2014 23:21    Microbiology: Recent Results (from the past 240 hour(s))  STOOL CULTURE     Status: None   Collection Time    04/20/14  7:24 AM      Result Value Ref Range Status   Specimen Description PERIRECTAL   Final   Special Requests Normal   Final   Culture     Final   Value: NO SUSPICIOUS COLONIES, CONTINUING TO HOLD     Performed at [facility]   Report Status PENDING   Incomplete  CLOSTRIDIUM DIFFICILE BY PCR     Status: None   Collection Time    04/20/14  5:18 PM      Result  Value Ref Range Status   C difficile by  pcr NEGATIVE  NEGATIVE Final     Labs: Basic Metabolic Panel:  Recent Labs Lab 04/15/14 2205 04/19/14 1920 04/20/14 0422 04/21/14 0534  NA 144 140 143 141  K 4.0 4.5 4.0 4.0  CL 106 102 106 103  CO2 25 27 24 24   GLUCOSE 153* 112* 100* 129*  BUN 9 7 5* 5*  CREATININE 0.70 0.70 0.66 0.63  CALCIUM 9.7 9.7 9.1 9.2  MG  --   --  1.7  --   PHOS  --   --  3.3  --    Liver Function Tests:  Recent Labs Lab 04/15/14 2205 04/20/14 0422  AST 15 18  ALT 10 12  ALKPHOS 104 93  BILITOT 0.2* 0.2*  PROT 7.6 6.7  ALBUMIN 3.9 3.1*    Recent Labs Lab 04/15/14 2205  LIPASE 45   No results found for this basename: AMMONIA,  in the last 168 hours CBC:  Recent Labs Lab 04/15/14 2205 04/19/14 1920 04/20/14 0422 04/21/14 0534  WBC 7.3 7.0 6.2 5.1  NEUTROABS 3.9 3.8  --   --   HGB 11.5* 12.4 11.2* 10.7*  HCT 34.6* 37.1 34.8* 33.1*  MCV 75.1* 74.6* 74.8* 73.9*  PLT 212 232 212 218   Cardiac Enzymes: No results found for this basename: CKTOTAL, CKMB, CKMBINDEX, TROPONINI,  in the last 168 hours BNP: BNP (last 3 results) No results found for this basename: PROBNP,  in the last 8760 hours CBG:  Recent Labs Lab 04/21/14 2355 04/22/14 0437 04/22/14 0807 04/22/14 1159 04/22/14 1621  GLUCAP 110* 136* 157* 92 103*       Signed:  Jacia Sickman  [facility] 04/22/2014, 6:13 PM

## 2014-04-22 NOTE — Op Note (Signed)
[facility] Bonnie [name], [id number]   OPERATIVE PROCEDURE REPORT  PATIENT: Bonnie [name], Bonnie [name]  MR#: [id number] BIRTHDATE: 1956-05-18  GENDER: Female ENDOSCOPIST: Carol Ada, MD ASSISTANT:   Cleda Daub, RN [facility] Cristopher Estimable, technician PROCEDURE DATE: 04/22/2014 PROCEDURE:   Colonoscopy with biopsy ASA CLASS:   Class III INDICATIONS: Abnormal CT scan and diarrhea. MEDICATIONS: Versed 10 mg IV and Fentanyl 175 mcg IV  DESCRIPTION OF PROCEDURE:   After the risks benefits and alternatives of the procedure were thoroughly explained, informed consent was obtained.  A digital rectal exam revealed no abnormalities of the rectum.    The Pentax Adult Colon [id number] endoscope was introduced through the anus  and advanced to the terminal ileum which was intubated for a short distance , No adverse events experienced.    The quality of the prep was excellent. .  The instrument was then slowly withdrawn as the colon was fully examined.     FINDINGS: A normal appearing cecum, ileocecal valve, and appendiceal orifice were identified.  The ascending, hepatic flexure, transverse, splenic flexure, descending, sigmoid colon and rectum appeared unremarkable.  No polyps or cancers were seen.   No evidence of any inflammation.  Random colonic biopsies were obtained.  Deep intubation of the TI was achieved, approximately 30 proximally.  No overt inflammation noted in this region, but cold biopsies were obtained.          The scope was then withdrawn from the patient and the procedure terminated.  COMPLICATIONS: There were no complications.  IMPRESSION: 1) Normal TI and colon.  RECOMMENDATIONS: 1) Await biopsy results. 2) Continue with antibiotics.  _______________________________ Lorrin MaisCarol Ada, MD 04/22/2014 2:25 PM

## 2014-04-22 NOTE — Discharge Summary (Signed)
Patient given discharge paperwork. Reviewed follow up appointment with patient. Patient given prescriptions. Reviewed My Chrt with patient. Patient ready for discharge.

## 2014-04-22 NOTE — Interval H&P Note (Signed)
History and Physical Interval Note:  04/22/2014 1:17 PM  Bonnie [name]  has presented today for surgery, with the diagnosis of Ileitis  The various methods of treatment have been discussed with the patient and family. After consideration of risks, benefits and other options for treatment, the patient has consented to  Procedure(s): COLONOSCOPY (Left) as a surgical intervention .  The patient's history has been reviewed, patient examined, no change in status, stable for surgery.  I have reviewed the patient's chart and labs.  Questions were answered to the patient's satisfaction.     [name]

## 2014-04-22 NOTE — H&P (View-Only) (Signed)
UNASSIGNED PATIENT Reason for Consult: Ileitis on CT. Referring Physician: [facility]-Dr Larwance Rote Bonnie [name] is an 58 y.o. female.  HPI: 58 year old black female, with multiple medical problems listed below, admitted with a 4 day history of diarrhea following a week of constipation. She denies having any melena or hematochezia. She has been having normal BM's till a week prior to admission. CT scan on admission revealed ileitis. She is not sure but thinks her mother had Crohn's disease. She ahs been on PPI for about 5 years.   Past Medical History  Diagnosis Date  . Hypercholesteremia   . Diabetes mellitus   . Anemia   . Obesity   . Anxiety   . GERD (gastroesophageal reflux disease)   . Angina    Past Surgical History  Procedure Laterality Date  . Brain surgery    . 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 Brain surgery      For meningioma.  . Abdominal hysterectomy    . Shoulder surgery    . Coccyx removal    . Tonsillectomy    . Bladder surgery     Family History  Problem Relation Age of Onset  . Diabetes Mother   . Lung cancer Father   . Diabetes Brother    Social History:  reports that she has quit smoking. She does not have any smokeless tobacco history on file. She reports that she does not drink alcohol or use illicit drugs.  Allergies:  Allergies  Allergen Reactions  . Biaxin [Clarithromycin] Swelling    Throat swelling   . Ampicillin Nausea And Vomiting  . Carafate [Sucralfate] Nausea And Vomiting  . Metronidazole Nausea And Vomiting   Medications: I have reviewed the patient's current medications.  Results for orders placed during the hospital encounter of 04/19/14 (from the past 48 hour(s))  CBC WITH DIFFERENTIAL     Status: Abnormal   Collection Time    04/19/14  7:20 PM      Result Value Ref Range   WBC 7.0  4.0 - 10.5 K/uL   RBC 4.97  3.87 - 5.11 MIL/uL   Hemoglobin 12.4  12.0 - 15.0 g/dL   HCT 37.1  36.0 - 46.0 %   MCV 74.6 (*) 78.0 - 100.0 fL   MCH 24.9  (*) 26.0 - 34.0 pg   MCHC 33.4  30.0 - 36.0 g/dL   RDW 16.2 (*) 11.5 - 15.5 %   Platelets 232  150 - 400 K/uL   Neutrophils Relative % 55  43 - 77 %   Neutro Abs 3.8  1.7 - 7.7 K/uL   Lymphocytes Relative 28  12 - 46 %   Lymphs Abs 1.9  0.7 - 4.0 K/uL   Monocytes Relative 16 (*) 3 - 12 %   Monocytes Absolute 1.1 (*) 0.1 - 1.0 K/uL   Eosinophils Relative 1  0 - 5 %   Eosinophils Absolute 0.1  0.0 - 0.7 K/uL   Basophils Relative 0  0 - 1 %   Basophils Absolute 0.0  0.0 - 0.1 K/uL  BASIC METABOLIC PANEL     Status: Abnormal   Collection Time    04/19/14  7:20 PM      Result Value Ref Range   Sodium 140  137 - 147 mEq/L   Potassium 4.5  3.7 - 5.3 mEq/L   Chloride 102  96 - 112 mEq/L   CO2 27  19 - 32 mEq/L  Glucose, Bld 112 (*) 70 - 99 mg/dL   BUN 7  6 - 23 mg/dL   Creatinine, Ser 0.70  0.50 - 1.10 mg/dL   Calcium 9.7  8.4 - 10.5 mg/dL   GFR calc non Af Amer >90  >90 mL/min   GFR calc Af Amer >90  >90 mL/min   Comment: (NOTE)     The eGFR has been calculated using the CKD EPI equation.     This calculation has not been validated in all clinical situations.     eGFR's persistently <90 mL/min signify possible Chronic Kidney     Disease.  SEDIMENTATION RATE     Status: None   Collection Time    04/19/14  7:20 PM      Result Value Ref Range   Sed Rate 21  0 - 22 mm/hr  I-STAT CG4 LACTIC ACID, ED     Status: None   Collection Time    04/19/14  7:37 PM      Result Value Ref Range   Lactic Acid, Venous 1.22  0.5 - 2.2 mmol/L  GLUCOSE, CAPILLARY     Status: None   Collection Time    04/20/14  4:06 AM      Result Value Ref Range   Glucose-Capillary 97  70 - 99 mg/dL   Comment 1 Notify RN    HEMOGLOBIN A1C     Status: Abnormal   Collection Time    04/20/14  4:22 AM      Result Value Ref Range   Hemoglobin A1C 7.1 (*) <5.7 %   Comment: (NOTE)                                                                               According to the ADA Clinical Practice Recommendations  for 2011, when     HbA1c is used as a screening test:      >=6.5%   Diagnostic of Diabetes Mellitus               (if abnormal result is confirmed)     5.7-6.4%   Increased risk of developing Diabetes Mellitus     References:Diagnosis and Classification of Diabetes Mellitus,Diabetes     Care,2011,34(Suppl 1):S62-S69 and Standards of Medical Care in             Diabetes - 2011,Diabetes Care,2011,34 (Suppl 1):S11-S61.   Mean Plasma Glucose 157 (*) <117 mg/dL   Comment: Performed at [facility]  MAGNESIUM     Status: None   Collection Time    04/20/14  4:22 AM      Result Value Ref Range   Magnesium 1.7  1.5 - 2.5 mg/dL  PHOSPHORUS     Status: None   Collection Time    04/20/14  4:22 AM      Result Value Ref Range   Phosphorus 3.3  2.3 - 4.6 mg/dL  TSH     Status: None   Collection Time    04/20/14  4:22 AM      Result Value Ref Range   TSH 1.520  0.350 - 4.500 uIU/mL  COMPREHENSIVE METABOLIC PANEL     Status: Abnormal   Collection   Time    04/20/14  4:22 AM      Result Value Ref Range   Sodium 143  137 - 147 mEq/L   Potassium 4.0  3.7 - 5.3 mEq/L   Chloride 106  96 - 112 mEq/L   CO2 24  19 - 32 mEq/L   Glucose, Bld 100 (*) 70 - 99 mg/dL   BUN 5 (*) 6 - 23 mg/dL   Creatinine, Ser 0.66  0.50 - 1.10 mg/dL   Calcium 9.1  8.4 - 10.5 mg/dL   Total Protein 6.7  6.0 - 8.3 g/dL   Albumin 3.1 (*) 3.5 - 5.2 g/dL   AST 18  0 - 37 U/L   ALT 12  0 - 35 U/L   Alkaline Phosphatase 93  39 - 117 U/L   Total Bilirubin 0.2 (*) 0.3 - 1.2 mg/dL   GFR calc non Af Amer >90  >90 mL/min   GFR calc Af Amer >90  >90 mL/min   Comment: (NOTE)     The eGFR has been calculated using the CKD EPI equation.     This calculation has not been validated in all clinical situations.     eGFR's persistently <90 mL/min signify possible Chronic Kidney     Disease.  CBC     Status: Abnormal   Collection Time    04/20/14  4:22 AM      Result Value Ref Range   WBC 6.2  4.0 - 10.5 K/uL   RBC 4.65  3.87  - 5.11 MIL/uL   Hemoglobin 11.2 (*) 12.0 - 15.0 g/dL   HCT 34.8 (*) 36.0 - 46.0 %   MCV 74.8 (*) 78.0 - 100.0 fL   MCH 24.1 (*) 26.0 - 34.0 pg   MCHC 32.2  30.0 - 36.0 g/dL   RDW 16.0 (*) 11.5 - 15.5 %   Platelets 212  150 - 400 K/uL  GLUCOSE, CAPILLARY     Status: Abnormal   Collection Time    04/20/14  7:21 AM      Result Value Ref Range   Glucose-Capillary 124 (*) 70 - 99 mg/dL  GLUCOSE, CAPILLARY     Status: None   Collection Time    04/20/14 11:47 AM      Result Value Ref Range   Glucose-Capillary 86  70 - 99 mg/dL  GLUCOSE, CAPILLARY     Status: Abnormal   Collection Time    04/20/14  3:34 PM      Result Value Ref Range   Glucose-Capillary 117 (*) 70 - 99 mg/dL  CLOSTRIDIUM DIFFICILE BY PCR     Status: None   Collection Time    04/20/14  5:18 PM      Result Value Ref Range   C difficile by pcr NEGATIVE  NEGATIVE  GLUCOSE, CAPILLARY     Status: Abnormal   Collection Time    04/20/14  7:39 PM      Result Value Ref Range   Glucose-Capillary 150 (*) 70 - 99 mg/dL   Comment 1 Notify RN    GLUCOSE, CAPILLARY     Status: None   Collection Time    04/20/14 11:41 PM      Result Value Ref Range   Glucose-Capillary 93  70 - 99 mg/dL   Comment 1 Notify RN    GLUCOSE, CAPILLARY     Status: Abnormal   Collection Time    04/21/14  2:24 AM      Result Value Ref  Range   Glucose-Capillary 102 (*) 70 - 99 mg/dL   Comment 1 Notify RN    GLUCOSE, CAPILLARY     Status: Abnormal   Collection Time    04/21/14  4:01 AM      Result Value Ref Range   Glucose-Capillary 120 (*) 70 - 99 mg/dL   Comment 1 Notify RN    BASIC METABOLIC PANEL     Status: Abnormal   Collection Time    04/21/14  5:34 AM      Result Value Ref Range   Sodium 141  137 - 147 mEq/L   Potassium 4.0  3.7 - 5.3 mEq/L   Chloride 103  96 - 112 mEq/L   CO2 24  19 - 32 mEq/L   Glucose, Bld 129 (*) 70 - 99 mg/dL   BUN 5 (*) 6 - 23 mg/dL   Creatinine, Ser 0.63  0.50 - 1.10 mg/dL   Calcium 9.2  8.4 - 10.5 mg/dL    GFR calc non Af Amer >90  >90 mL/min   GFR calc Af Amer >90  >90 mL/min   Comment: (NOTE)     The eGFR has been calculated using the CKD EPI equation.     This calculation has not been validated in all clinical situations.     eGFR's persistently <90 mL/min signify possible Chronic Kidney     Disease.  CBC     Status: Abnormal   Collection Time    04/21/14  5:34 AM      Result Value Ref Range   WBC 5.1  4.0 - 10.5 K/uL   RBC 4.48  3.87 - 5.11 MIL/uL   Hemoglobin 10.7 (*) 12.0 - 15.0 g/dL   HCT 33.1 (*) 36.0 - 46.0 %   MCV 73.9 (*) 78.0 - 100.0 fL   MCH 23.9 (*) 26.0 - 34.0 pg   MCHC 32.3  30.0 - 36.0 g/dL   RDW 15.8 (*) 11.5 - 15.5 %   Platelets 218  150 - 400 K/uL  LIPID PANEL     Status: None   Collection Time    04/21/14  5:34 AM      Result Value Ref Range   Cholesterol 110  0 - 200 mg/dL   Triglycerides 55  <150 mg/dL   HDL 50  >39 mg/dL   Total CHOL/HDL Ratio 2.2     VLDL 11  0 - 40 mg/dL   LDL Cholesterol 49  0 - 99 mg/dL   Comment:            Total Cholesterol/HDL:CHD Risk     Coronary Heart Disease Risk Table                         Men   Women      1/2 Average Risk   3.4   3.3      Average Risk       5.0   4.4      2 X Average Risk   9.6   7.1      3 X Average Risk  23.4   11.0                Use the calculated Patient Ratio     above and the CHD Risk Table     to determine the patient's CHD Risk.                ATP III CLASSIFICATION (LDL):      <  100     mg/dL   Optimal      100-129  mg/dL   Near or Above                        Optimal      130-159  mg/dL   Borderline      160-189  mg/dL   High      >190     mg/dL   Very High  GLUCOSE, CAPILLARY     Status: Abnormal   Collection Time    04/21/14  7:57 AM      Result Value Ref Range   Glucose-Capillary 136 (*) 70 - 99 mg/dL  GLUCOSE, CAPILLARY     Status: Abnormal   Collection Time    04/21/14 11:53 AM      Result Value Ref Range   Glucose-Capillary 134 (*) 70 - 99 mg/dL  GLUCOSE, CAPILLARY      Status: Abnormal   Collection Time    04/21/14  4:18 PM      Result Value Ref Range   Glucose-Capillary 211 (*) 70 - 99 mg/dL   Ct Abdomen Pelvis W Contrast  04/19/2014   CLINICAL DATA:  Worsening severe abdominal pain. Diarrhea. Vomiting. Enteritis.  EXAM: CT ABDOMEN AND PELVIS WITH CONTRAST  TECHNIQUE: Multidetector CT imaging of the abdomen and pelvis was performed using the standard protocol following bolus administration of intravenous contrast.  CONTRAST:  117m OMNIPAQUE IOHEXOL 300 MG/ML  SOLN  COMPARISON:  04/15/2014  FINDINGS: Mild increase in wall thickening is seen involving a loop of ileum within the right pelvis. There is no evidence of involvement of the terminal ileum. There is no evidence of bowel obstruction. No evidence of abscess or free fluid.  The liver, gallbladder, pancreas, spleen, adrenal glands, and kidneys are normal in appearance. No evidence hydronephrosis. No soft tissue masses or lymphadenopathy identified within the abdomen or pelvis.  IMPRESSION: Mild worsening of enteritis involving loop of ileum within the right pelvis. Differential diagnosis includes infectious and inflammatory etiologies, hemorrhage, and ischemia.  No evidence of bowel obstruction, abscess, or perforation.   Electronically Signed   By: JEarle GellM.D.   On: 04/19/2014 20:48   Review of Systems  Constitutional: Negative.   HENT: Negative.   Eyes: Negative.   Respiratory: Negative.   Cardiovascular: Negative.   Gastrointestinal: Positive for heartburn, abdominal pain and diarrhea. Negative for nausea, vomiting, blood in stool and melena.  Genitourinary: Negative.   Skin: Negative.   Neurological: Negative.   Endo/Heme/Allergies: Negative.   Psychiatric/Behavioral: The patient is nervous/anxious.    Blood pressure 113/63, pulse 73, temperature 98.6 F (37 C), temperature source Oral, resp. rate 16, height _0  (1.727 m), weight 81.9 kg (180 lb 8.9 oz), SpO2 99.00%. Physical Exam   Constitutional: She is oriented to person, place, and time. She appears well-developed and well-nourished.  HENT:  Head: Normocephalic.  Eyes: Conjunctivae and EOM are normal. Pupils are equal, round, and reactive to light.  Neck: Normal range of motion.  Cardiovascular: Normal rate and regular rhythm.   Respiratory: Effort normal and breath sounds normal.  GI: Soft. Bowel sounds are normal. There is no hepatosplenomegaly. There is tenderness in the right lower quadrant. There is no rebound and no CVA tenderness.  Musculoskeletal: Normal range of motion.  Neurological: She is alert and oriented to person, place, and time.  Skin: Skin is warm and dry.  Psychiatric: She has a normal mood and  affect. Her behavior is normal. Judgment and thought content normal.   Assessment/Plan: 1) RLQ pain with ileitis on CT/Mild anemia/change in bowel habits: Will prep for a colonoscopy tomorrow.  2) GERD on PPI's.  Bonnie [name] 04/21/2014, 5:25 PM

## 2014-04-23 ENCOUNTER — Encounter (HOSPITAL_COMMUNITY): Payer: Self-pay | Admitting: Gastroenterology

## 2014-04-24 LAB — STOOL CULTURE: Special Requests: NORMAL

## 2014-06-25 ENCOUNTER — Ambulatory Visit: Payer: Medicare Other | Admitting: Podiatry

## 2014-06-25 ENCOUNTER — Encounter: Payer: Self-pay | Admitting: Podiatry

## 2014-06-25 ENCOUNTER — Ambulatory Visit: Payer: Self-pay

## 2014-06-25 VITALS — BP 142/75 | HR 88 | Resp 17

## 2014-06-25 DIAGNOSIS — G589 Mononeuropathy, unspecified: Secondary | ICD-10-CM

## 2014-06-25 DIAGNOSIS — B351 Tinea unguium: Secondary | ICD-10-CM

## 2014-06-25 DIAGNOSIS — Q828 Other specified congenital malformations of skin: Secondary | ICD-10-CM

## 2014-06-25 DIAGNOSIS — L988 Other specified disorders of the skin and subcutaneous tissue: Secondary | ICD-10-CM

## 2014-06-25 DIAGNOSIS — R52 Pain, unspecified: Secondary | ICD-10-CM

## 2014-06-25 DIAGNOSIS — G629 Polyneuropathy, unspecified: Secondary | ICD-10-CM

## 2014-06-25 DIAGNOSIS — R0989 Other specified symptoms and signs involving the circulatory and respiratory systems: Secondary | ICD-10-CM

## 2014-06-25 DIAGNOSIS — E1149 Type 2 diabetes mellitus with other diabetic neurological complication: Secondary | ICD-10-CM

## 2014-06-25 NOTE — Patient Instructions (Signed)
Diabetes and Foot Care Diabetes may cause you to have problems because of poor blood supply (circulation) to your feet and legs. This may cause the skin on your feet to become thinner, break easier, and heal more slowly. Your skin may become dry, and the skin may peel and crack. You may also have nerve damage in your legs and feet causing decreased feeling in them. You may not notice minor injuries to your feet that could lead to infections or more serious problems. Taking care of your feet is one of the most important things you can do for yourself.  HOME CARE INSTRUCTIONS  Wear shoes at all times, even in the house. Do not go barefoot. Bare feet are easily injured.  Check your feet daily for blisters, cuts, and redness. If you cannot see the bottom of your feet, use a mirror or ask someone for help.  Wash your feet with warm water (do not use hot water) and mild soap. Then pat your feet and the areas between your toes until they are completely dry. Do not soak your feet as this can dry your skin.  Apply a moisturizing lotion or petroleum jelly (that does not contain alcohol and is unscented) to the skin on your feet and to dry, brittle toenails. Do not apply lotion between your toes.  Trim your toenails straight across. Do not dig under them or around the cuticle. File the edges of your nails with an emery board or nail file.  Do not cut corns or calluses or try to remove them with medicine.  Wear clean socks or stockings every day. Make sure they are not too tight. Do not wear knee-high stockings since they may decrease blood flow to your legs.  Wear shoes that fit properly and have enough cushioning. To break in new shoes, wear them for just a few hours a day. This prevents you from injuring your feet. Always look in your shoes before you put them on to be sure there are no objects inside.  Do not cross your legs. This may decrease the blood flow to your feet.  If you find a minor scrape,  cut, or break in the skin on your feet, keep it and the skin around it clean and dry. These areas may be cleansed with mild soap and water. Do not cleanse the area with peroxide, alcohol, or iodine.  When you remove an adhesive bandage, be sure not to damage the skin around it.  If you have a wound, look at it several times a day to make sure it is healing.  Do not use heating pads or hot water bottles. They may burn your skin. If you have lost feeling in your feet or legs, you may not know it is happening until it is too late.  Make sure your health care provider performs a complete foot exam at least annually or more often if you have foot problems. Report any cuts, sores, or bruises to your health care provider immediately. SEEK MEDICAL CARE IF:   You have an injury that is not healing.  You have cuts or breaks in the skin.  You have an ingrown nail.  You notice redness on your legs or feet.  You feel burning or tingling in your legs or feet.  You have pain or cramps in your legs and feet.  Your legs or feet are numb.  Your feet always feel cold. SEEK IMMEDIATE MEDICAL CARE IF:   There is increasing redness,   swelling, or pain in or around a wound.  There is a red line that goes up your leg.  Pus is coming from a wound.  You develop a fever or as directed by your health care provider.  You notice a bad smell coming from an ulcer or wound. Document Released: 10/28/2000 Document Revised: 07/03/2013 Document Reviewed: 04/09/2013 [employer] Patient Information 2015 [employer]. This information is not intended to replace advice given to you by your health care provider. Make sure you discuss any questions you have with your health care provider.  

## 2014-06-25 NOTE — Progress Notes (Signed)
   Subjective:    Patient ID: Bonnie [name], female    DOB: 03-Jul-1956, 58 y.o.   MRN: [id number]  HPI As Litzinger presents today with complaints of bilateral big toe pain, as well as painful hyperkeratotic lesions on the plantar to the feet bilaterally. Patient also has dry skin on the plantar aspect of her feet. She states that she previously had the medial borders of bilateral hallux nails removed but has since become painful. For the painful calluses she has tried soaks and periodic scrubs the area however the callus continues to build and become painful. She denies any ulceration. Her last A1c was 7.1. She does state that she gets periodic pain in her legs at night.  He states that she's had a hyperpigmented area on her right hallux for numerous years.  No other complaint at this time.    Review of Systems  Musculoskeletal: Positive for back pain.  All other systems reviewed and are negative.      Objective:   Physical Exam  Nursing note and vitals reviewed. Constitutional: She is oriented to person, place, and time. She appears well-developed and well-nourished.  Musculoskeletal: She exhibits no edema and no tenderness.  Hammertoes b/l 2nd.   Neurological: She is alert and oriented to person, place, and time.  Protective sensation decreased with the Semmes Weinstein monofilament, decreased vibratory sensation.  Skin:  Hyperkeratotic lesions bilateral sub-met one and right submetatarsal 5. Bilateral hallux medial border nail with a small amount of hyperkeratotic tissue and some debris within proximal medial nail fold. There is no evidence of ingrowing nail this time. No associated erythema, drainage here. Small area of hyperpigmentation over the right hallux nail. There are 2 small areas of hyperpigmented skin on the plantar aspect of the right hallux. The overlying skin is intact, and the area is flat.  There is no irregularity of the borders and the color is symmetrical. Dry, scaley skin noted  on the plantar aspect of the feet bilaterally. No open lesions. B/L hallux and 5th digit nails elongated, hypertrophic, painful.   DP pulses intact b/l, PT pulse decreased b/l. CRT <3 sec.  No calf pain.         Assessment & Plan:  Bonnie [name], 58 year old female, bilateral painful hyperkeratotic lesions bilateral hallux medial nail border pain. -Discussed the importance of daily foot given her history of diabetes, neuropathy. Monitor for any wounds/changes.  -Hyperkeratotic lesions sharply debrided without complication. -Bilateral hallux medial nail folds were curetted to remove debris within the borders as well as debrided without complication. There was no evidence of ingrowing nail at this time. No clinical signs of infection. Following debridement the patient's pain was relieved. -Given the decreased pulse exam and pain at night will order a noninvasive vascular testing to further evaluate her vascular status.  -At this time the lesions on the right hallux appeared to be benign. Will continue to monitor. We will reevaluate after the results of the vascular studies. -Recommended moisturizers for the dry skin. -Given her diabetes with neuropathy and painful lesions will start the process of getting diabetic shoes. -Followup after vascular studies or sooner if any problems are to occur.

## 2014-06-26 ENCOUNTER — Emergency Department (HOSPITAL_COMMUNITY)
Admission: EM | Admit: 2014-06-26 | Discharge: 2014-06-26 | Disposition: A | Discharge status: Home / Self Care | Payer: Medicare Other | Attending: Emergency Medicine | Admitting: Emergency Medicine

## 2014-06-26 ENCOUNTER — Encounter (HOSPITAL_COMMUNITY): Payer: Self-pay | Admitting: Emergency Medicine

## 2014-06-26 ENCOUNTER — Emergency Department (HOSPITAL_COMMUNITY): Payer: Medicare Other

## 2014-06-26 DIAGNOSIS — K219 Gastro-esophageal reflux disease without esophagitis: Secondary | ICD-10-CM | POA: Diagnosis not present

## 2014-06-26 DIAGNOSIS — R51 Headache: Secondary | ICD-10-CM | POA: Insufficient documentation

## 2014-06-26 DIAGNOSIS — E669 Obesity, unspecified: Secondary | ICD-10-CM | POA: Diagnosis not present

## 2014-06-26 DIAGNOSIS — Z862 Personal history of diseases of the blood and blood-forming organs and certain disorders involving the immune mechanism: Secondary | ICD-10-CM | POA: Insufficient documentation

## 2014-06-26 DIAGNOSIS — E7800 Pure hypercholesterolemia, unspecified: Secondary | ICD-10-CM | POA: Diagnosis not present

## 2014-06-26 DIAGNOSIS — Z79899 Other long term (current) drug therapy: Secondary | ICD-10-CM | POA: Diagnosis not present

## 2014-06-26 DIAGNOSIS — Z87891 Personal history of nicotine dependence: Secondary | ICD-10-CM | POA: Insufficient documentation

## 2014-06-26 DIAGNOSIS — Z90710 Acquired absence of both cervix and uterus: Secondary | ICD-10-CM | POA: Insufficient documentation

## 2014-06-26 DIAGNOSIS — F411 Generalized anxiety disorder: Secondary | ICD-10-CM | POA: Diagnosis not present

## 2014-06-26 DIAGNOSIS — E119 Type 2 diabetes mellitus without complications: Secondary | ICD-10-CM | POA: Insufficient documentation

## 2014-06-26 DIAGNOSIS — R519 Headache, unspecified: Secondary | ICD-10-CM

## 2014-06-26 DIAGNOSIS — Z8669 Personal history of other diseases of the nervous system and sense organs: Secondary | ICD-10-CM | POA: Insufficient documentation

## 2014-06-26 LAB — CBC WITH DIFFERENTIAL/PLATELET
BASOS ABS: 0 10*3/uL (ref 0.0–0.1)
BASOS PCT: 1 % (ref 0–1)
EOS PCT: 2 % (ref 0–5)
Eosinophils Absolute: 0.1 10*3/uL (ref 0.0–0.7)
HEMATOCRIT: 34.3 % — AB (ref 36.0–46.0)
Hemoglobin: 11.1 g/dL — ABNORMAL LOW (ref 12.0–15.0)
Lymphocytes Relative: 40 % (ref 12–46)
Lymphs Abs: 2.3 10*3/uL (ref 0.7–4.0)
MCH: 24.1 pg — ABNORMAL LOW (ref 26.0–34.0)
MCHC: 32.4 g/dL (ref 30.0–36.0)
MCV: 74.4 fL — AB (ref 78.0–100.0)
MONO ABS: 0.6 10*3/uL (ref 0.1–1.0)
Monocytes Relative: 10 % (ref 3–12)
Neutro Abs: 2.7 10*3/uL (ref 1.7–7.7)
Neutrophils Relative %: 47 % (ref 43–77)
PLATELETS: 213 10*3/uL (ref 150–400)
RBC: 4.61 MIL/uL (ref 3.87–5.11)
RDW: 15.9 % — AB (ref 11.5–15.5)
WBC: 5.7 10*3/uL (ref 4.0–10.5)

## 2014-06-26 LAB — BASIC METABOLIC PANEL
ANION GAP: 12 (ref 5–15)
BUN: 12 mg/dL (ref 6–23)
CALCIUM: 9.5 mg/dL (ref 8.4–10.5)
CO2: 27 mEq/L (ref 19–32)
CREATININE: 0.67 mg/dL (ref 0.50–1.10)
Chloride: 103 mEq/L (ref 96–112)
GFR calc non Af Amer: >90 mL/min (ref >90)
Glucose, Bld: 97 mg/dL (ref 70–99)
Potassium: 4.1 mEq/L (ref 3.7–5.3)
Sodium: 142 mEq/L (ref 137–147)

## 2014-06-26 LAB — SEDIMENTATION RATE: Sed Rate: 20 mm/hr (ref 0–22)

## 2014-06-26 LAB — C-REACTIVE PROTEIN: CRP: <0.5 mg/dL — ABNORMAL LOW (ref <0.60)

## 2014-06-26 MED ORDER — METOCLOPRAMIDE HCL 5 MG/ML IJ SOLN
10.0000 mg | Freq: Once | INTRAMUSCULAR | Status: AC
  Administered 2014-06-26: 10 mg via INTRAVENOUS
  Filled 2014-06-26: qty 2

## 2014-06-26 MED ORDER — MORPHINE SULFATE 2 MG/ML IJ SOLN
2.0000 mg | Freq: Once | INTRAMUSCULAR | Status: DC
  Filled 2014-06-26: qty 1

## 2014-06-26 MED ORDER — METOCLOPRAMIDE HCL 10 MG PO TABS: 10.0000 mg | ORAL_TABLET | Freq: Four times a day (QID) | ORAL | Status: DC

## 2014-06-26 MED ORDER — DIPHENHYDRAMINE HCL 50 MG/ML IJ SOLN
25.0000 mg | Freq: Once | INTRAMUSCULAR | Status: AC
  Administered 2014-06-26: 25 mg via INTRAVENOUS
  Filled 2014-06-26: qty 1

## 2014-06-26 MED ORDER — KETOROLAC TROMETHAMINE 30 MG/ML IJ SOLN
30.0000 mg | Freq: Once | INTRAMUSCULAR | Status: AC
  Administered 2014-06-26: 30 mg via INTRAVENOUS
  Filled 2014-06-26: qty 1

## 2014-06-26 MED ORDER — SODIUM CHLORIDE 0.9 % IV BOLUS (SEPSIS)
1000.0000 mL | Freq: Once | INTRAVENOUS | Status: AC
  Administered 2014-06-26: 1000 mL via INTRAVENOUS

## 2014-06-26 NOTE — Discharge Instructions (Signed)
Take Reglan every 6 hours as needed for headache.  You may also use Tylenol or Ibuprofen.    General Headache Without Cause A headache is pain or discomfort felt around the head or neck area. The specific cause of a headache may not be found. There are many causes and types of headaches. A few common ones are:  Tension headaches.  Migraine headaches.  Cluster headaches.  Chronic daily headaches. HOME CARE INSTRUCTIONS   Keep all follow-up appointments with your caregiver or any specialist referral.  Only take over-the-counter or prescription medicines for pain or discomfort as directed by your caregiver.  Lie down in a dark, quiet room when you have a headache.  Keep a headache journal to find out what may trigger your migraine headaches. For example, write down:  What you eat and drink.  How much sleep you get.  Any change to your diet or medicines.  Try massage or other relaxation techniques.  Put ice packs or heat on the head and neck. Use these 3 to 4 times per day for 15 to 20 minutes each time, or as needed.  Limit stress.  Sit up straight, and do not tense your muscles.  Quit smoking if you smoke.  Limit alcohol use.  Decrease the amount of caffeine you drink, or stop drinking caffeine.  Eat and sleep on a regular schedule.  Get 7 to 9 hours of sleep, or as recommended by your caregiver.  Keep lights dim if bright lights bother you and make your headaches worse. SEEK MEDICAL CARE IF:   You have problems with the medicines you were prescribed.  Your medicines are not working.  You have a change from the usual headache.  You have nausea or vomiting. SEEK IMMEDIATE MEDICAL CARE IF:   Your headache becomes severe.  You have a fever.  You have a stiff neck.  You have loss of vision.  You have muscular weakness or loss of muscle control.  You start losing your balance or have trouble walking.  You feel faint or pass out.  You have severe  symptoms that are different from your first symptoms. MAKE SURE YOU:   Understand these instructions.  Will watch your condition.  Will get help right away if you are not doing well or get worse. Document Released: 10/31/2005 Document Revised: 01/23/2012 Document Reviewed: 11/16/2011 [facility] Patient Information 2015 [place], [place]. This information is not intended to replace advice given to you by your health care provider. Make sure you discuss any questions you have with your health care provider.

## 2014-06-26 NOTE — ED Provider Notes (Signed)
CSN: [id number]     Arrival date & time 06/26/14  2725 History   First MD Initiated Contact with Patient 06/26/14 0600     Chief Complaint  Patient presents with  . Headache     (Consider location/radiation/quality/duration/timing/severity/associated sxs/prior Treatment) HPI Bonnie [name] is a 58 year old female with past medical history of craniotomy for brain tumor in 2005, diabetes, hypercholesterolemia, GERD who presents with approximately 8 hours of new onset headache. Patient states she was doing housework last night, and when she laid down on her bed and she noticed a throbbing, sharp sensation in her right temple. Patient states the pain is positional, hurts worse lying down and was relieved with placement of ice pack. She states the pain is intermittent, is a 10 out of 10 when it is present.  Patient states she has had regular followup with her neurosurgeon at [facility], and is supposed to return in " a few months". Patient states she has not had any additional imaging of her head since her craniotomy. Patient also reports a generalized weakness which he states is due to her pain. Patient denies dizziness, unilateral weakness, change in vision, loss in vision, eye pain, slurred speech, fever, neck pain, chest pain, SOB.  Pt reports having a mild sore throat s/p EGD two days ago.    Past Medical History  Diagnosis Date  . Hypercholesteremia   . Diabetes mellitus   . Anemia   . Obesity   . Anxiety   . GERD (gastroesophageal reflux disease)   . Angina    Past Surgical History  Procedure Laterality Date  . Brain surgery    . 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 Brain surgery      For meningioma.  . Abdominal hysterectomy    . Shoulder surgery    . Coccyx removal    . Tonsillectomy    . Bladder surgery    . Colonoscopy Left 04/22/2014    Procedure: COLONOSCOPY;  Surgeon: [name], MD;  Location: [facility];  Service: Endoscopy;  Laterality: Left;   Family History  Problem Relation  Age of Onset  . Diabetes Mother   . Lung cancer Father   . Diabetes Brother    History  Substance Use Topics  . Smoking status: Former Research scientist (life sciences)  . Smokeless tobacco: Not on file  . Alcohol Use: No   OB History   Grav Para Term Preterm Abortions TAB SAB Ect Mult Living                 Review of Systems  Constitutional: Negative for fever, chills, activity change and fatigue.  HENT: Positive for sore throat. Negative for ear pain, hearing loss, tinnitus and trouble swallowing.   Eyes: Negative for photophobia, pain, redness and visual disturbance.  Respiratory: Negative for shortness of breath.   Cardiovascular: Negative for chest pain.  Gastrointestinal: Negative for nausea, vomiting and abdominal pain.  Genitourinary: Negative for dysuria.  Musculoskeletal: Negative for arthralgias, myalgias, neck pain and neck stiffness.  Skin: Negative for rash.  Allergic/Immunologic: Negative for immunocompromised state.  Neurological: Positive for light-headedness and headaches. Negative for dizziness, syncope, weakness and numbness.  Psychiatric/Behavioral: Negative.       Allergies  Biaxin; Ampicillin; Carafate; and Metronidazole  Home Medications   Prior to Admission medications   Medication Sig Start Date End Date Taking? Authorizing Provider  carboxymethylcellulose (REFRESH TEARS) 0.5 % SOLN 1 drop See admin instructions. Instill 1 drop in both eyes every  morning and 1 drop in right eye every night at bedtime   Yes Historical Provider, MD  carboxymethylcellulose (REFRESH) 1 % ophthalmic solution Place 1 drop into the left eye at bedtime.   Yes Historical Provider, MD  clonazePAM (KLONOPIN) 0.5 MG tablet Take 1 tablet (0.5 mg total) by mouth at bedtime as needed (insomnia). 04/22/14  Yes Kelvin Cellar, MD  Dapagliflozin Propanediol (FARXIGA) 10 MG TABS Take 5 mg by mouth daily.   Yes Historical Provider, MD  dexlansoprazole (DEXILANT) 60 MG capsule Take 60 mg by mouth daily.    Yes  Historical Provider, MD  HYDROcodone-acetaminophen (NORCO) 5-325 MG per tablet Take 0.5-2 tablets by mouth 3 (three) times daily as needed. 1/2 tablet for mild pain, 1 tablet for moderate pain, 2 tablets for severe pain   Yes Historical Provider, MD  Liraglutide (VICTOZA) 18 MG/3ML SOPN Inject 7.2 mg into the skin daily. 1.2 mls   Yes Historical Provider, MD  metFORMIN (GLUCOPHAGE) 1000 MG tablet Take 1,000 mg by mouth 2 (two) times daily with a meal.    Yes Historical Provider, MD  ramipril (ALTACE) 5 MG capsule Take 5 mg by mouth daily.   Yes Historical Provider, MD  simvastatin (ZOCOR) 40 MG tablet Take 20 mg by mouth daily at 6 PM.    Yes Historical Provider, MD  valACYclovir (VALTREX) 1000 MG tablet Take 500 mg by mouth daily. For outbreak   Yes Historical Provider, MD  vitamin C (ASCORBIC ACID) 500 MG tablet Take 500 mg by mouth daily.   Yes Historical Provider, MD  metoCLOPramide (REGLAN) 10 MG tablet Take 1 tablet (10 mg total) by mouth every 6 (six) hours. 06/26/14   Carrie Mew, PA-C   BP 132/65  Pulse 72  Temp(Src) 98.2 F (36.8 C) (Oral)  Resp 15  Ht $R'5\' 8"'MV$  (1.727 m)  Wt 177 lb (80.287 kg)  BMI 26.92 kg/m2  SpO2 99% Physical Exam  Constitutional: She is oriented to person, place, and time. She appears well-developed and well-nourished. No distress.  HENT:  Head: Normocephalic and atraumatic.  Right Ear: Hearing and tympanic membrane normal.  Left Ear: Hearing and tympanic membrane normal.  Nose: Nose normal.  Mouth/Throat: Uvula is midline, oropharynx is clear and moist and mucous membranes are normal.  Neck: Normal range of motion and full passive range of motion without pain.  Cardiovascular: Normal rate, regular rhythm, S1 normal and S2 normal.   No murmur heard. Pulmonary/Chest: Effort normal and breath sounds normal. No accessory muscle usage. Not tachypneic. No respiratory distress.  Abdominal: Soft. Normal appearance. There is no tenderness.  Neurological: She is  alert and oriented to person, place, and time. She has normal strength. No cranial nerve deficit or sensory deficit. She displays a negative Romberg sign. GCS eye subscore is 4. GCS verbal subscore is 5. GCS motor subscore is 6.  Skin: Skin is warm and dry. She is not diaphoretic.  Psychiatric: She has a normal mood and affect.    ED Course  Procedures (including critical care time) Labs Review Labs Reviewed  C-REACTIVE PROTEIN - Abnormal; Notable for the following:    CRP <0.5 (*)    All other components within normal limits  CBC WITH DIFFERENTIAL - Abnormal; Notable for the following:    Hemoglobin 11.1 (*)    HCT 34.3 (*)    MCV 74.4 (*)    MCH 24.1 (*)    RDW 15.9 (*)    All other components within normal limits  SEDIMENTATION  RATE  BASIC METABOLIC PANEL    Imaging Review Ct Head Wo Contrast  06/26/2014   CLINICAL DATA:  Right temporal parietal headache. History of tumor resection on the right.  EXAM: CT HEAD WITHOUT CONTRAST  TECHNIQUE: Contiguous axial images were obtained from the base of the skull through the vertex without intravenous contrast.  COMPARISON:  Brain MRI 05/24/2010  FINDINGS: There is encephalomalacia within the right middle cranial fossa beneath the right temporal craniotomy flap.  No acute intracranial hemorrhage. No focal mass lesion. No CT evidence of acute infarction. No midline shift or mass effect. No hydrocephalus. Basilar cisterns are patent.  Paranasal sinuses and  mastoid air cells are clear.  IMPRESSION: No acute intracranial findings.   Electronically Signed   By: Suzy Bouchard M.D.   On: 06/26/2014 07:53     EKG Interpretation None      MDM   Final diagnoses:  Headache, unspecified headache type    58 year old female with past medical history of craniotomy for brain tumor resection in 2005 presenting with headache since last night at 10 PM. Patient's headache is located in the region where she states she underwent a craniotomy. Initial  presentation concerning for potential intracranial abnormality, or GCA.  Workup to include noncontrast CT head, CRP, ESR, CBC, BMP. Patient's pain initially treated with Benadryl, Reglan, Toradol.  8:00 AM CT returned showing no acute intracranial findings. Patient states her headache is feeling better.  9:08 AM: Patient states her headache is gone at this point. ESR returned within normal limits. Due to the fact that imaging returned negative for intracranial abnormalities, and lab work is unremarkable at this point, we'll discharge patient and have her follow up with her PCP, and her neurosurgeon. We will give patient Reglan by mouth for her headache.  We encouraged patient to call or return to the ER should she have any questions or concerns.   Filed Vitals:   06/26/14 1016  BP:   Pulse:   Temp: 98.2 F (36.8 C)  Resp:     Signed,  Dahlia Bailiff, PA-C 5:13 PM  Patient seen and discussed with Dr. Shirlyn Goltz, M.D.    Carrie Mew, PA-C 06/26/14 [phone]

## 2014-06-26 NOTE — ED Notes (Signed)
Rt sided headache since 2200 she had a brain rumor removed from there 2005.  No n or v.  She was bitten by and insect behind her lt ear some time today

## 2014-06-27 NOTE — ED Provider Notes (Signed)
Medical screening examination/treatment/procedure(s) were performed by non-physician practitioner and as supervising physician I was immediately available for consultation/collaboration.   Delora Fuel, MD 22/63/33 [id number]

## 2014-07-01 ENCOUNTER — Ambulatory Visit (HOSPITAL_COMMUNITY)
Admission: RE | Admit: 2014-07-01 | Discharge: 2014-07-01 | Disposition: A | Discharge status: Home / Self Care | Payer: Medicare Other | Source: Ambulatory Visit | Attending: Cardiology | Admitting: Cardiology

## 2014-07-01 DIAGNOSIS — R0989 Other specified symptoms and signs involving the circulatory and respiratory systems: Secondary | ICD-10-CM | POA: Insufficient documentation

## 2014-07-01 NOTE — Progress Notes (Signed)
Lower Extremity Arterial Duplex Completed. °Brianna L Mazza,RVT °

## 2014-09-17 DIAGNOSIS — N941 Unspecified dyspareunia: Secondary | ICD-10-CM | POA: Insufficient documentation

## 2014-09-27 ENCOUNTER — Ambulatory Visit: Payer: Medicare Other | Admitting: Podiatry

## 2014-11-04 ENCOUNTER — Other Ambulatory Visit: Payer: Self-pay | Admitting: Neurosurgery

## 2014-11-04 DIAGNOSIS — M5416 Radiculopathy, lumbar region: Secondary | ICD-10-CM

## 2014-11-18 ENCOUNTER — Other Ambulatory Visit: Payer: Medicare Other

## 2014-11-28 ENCOUNTER — Ambulatory Visit: Payer: Self-pay | Admitting: Podiatry

## 2014-12-01 DIAGNOSIS — E118 Type 2 diabetes mellitus with unspecified complications: Secondary | ICD-10-CM | POA: Diagnosis not present

## 2014-12-01 DIAGNOSIS — E789 Disorder of lipoprotein metabolism, unspecified: Secondary | ICD-10-CM | POA: Diagnosis not present

## 2014-12-01 DIAGNOSIS — I10 Essential (primary) hypertension: Secondary | ICD-10-CM | POA: Diagnosis not present

## 2014-12-03 ENCOUNTER — Ambulatory Visit (INDEPENDENT_AMBULATORY_CARE_PROVIDER_SITE_OTHER): Payer: Medicare Other | Admitting: Podiatry

## 2014-12-03 ENCOUNTER — Encounter: Payer: Self-pay | Admitting: Podiatry

## 2014-12-03 DIAGNOSIS — L84 Corns and callosities: Secondary | ICD-10-CM

## 2014-12-03 DIAGNOSIS — M79673 Pain in unspecified foot: Secondary | ICD-10-CM

## 2014-12-07 NOTE — Progress Notes (Signed)
Patient ID: Bonnie [name], female   DOB: 06/22/1956, 59 y.o.   MRN: [id number]  Subjective: 59 year old female presents the office today for painful calluses to both of her feet. She states that since last appointment she has been trying to trim them herself as well as applying moisturizer. She denies any recent redness or drainage from around the sites. Denies any acute changes since last appointment and no other complaints at this time. Denies any systemic complaints as fevers, chills, nausea, vomiting.  Objective: AAO 3, NAD DP/PT pulses palpable bilaterally, CRT less than 3 seconds Protective sensation appears to be intact with Derrel Nip monofilament, vibratory sensation intact, Achilles tendon reflex intact. Hyperkeratotic lesions bilateral submetatarsal 1, submetatarsal 5, bilateral medial hallux border. There was no evidence of dried blood around lesions to bilateral feet. Upon debridement of the thick lesions both calluses which had evidence of dried blood had a small amount of bleeding. There is no clinical signs of infection. No other open lesions or pre-ulcer lesions identified. There is mild tenderness directly overlying these lesions. No other areas of tenderness to bilateral lower extremities. No overlying edema, erythema, increase in warmth bilaterally. MMT 5/5, ROM WNL No pain with calf compression, swelling, warmth, erythema.  Assessment: 59 year old female with symptomatic hyperkeratotic lesions  Plan: -Treatment options were discussed with the patient including alternatives, risks, complications. -Hyperkeratotic lesion sharply debrided 6. 2 without calluses which had dried blood underneath them upon debridement has small amount of bleeding. Discussed with the patient to keep the area clean and apply a small amount of antibiotic ointment and a Band-Aid. Continue to monitor these areas for any changes. Call immediately if there is any questions, concerns. -Follow-up in 3  months or sooner should any problems arise. In the meantime, encouraged to call the office with any questions, concerns, change in symptoms.

## 2014-12-22 DIAGNOSIS — H2513 Age-related nuclear cataract, bilateral: Secondary | ICD-10-CM | POA: Diagnosis not present

## 2014-12-22 DIAGNOSIS — H40013 Open angle with borderline findings, low risk, bilateral: Secondary | ICD-10-CM | POA: Diagnosis not present

## 2014-12-22 DIAGNOSIS — H04123 Dry eye syndrome of bilateral lacrimal glands: Secondary | ICD-10-CM | POA: Diagnosis not present

## 2014-12-23 DIAGNOSIS — E118 Type 2 diabetes mellitus with unspecified complications: Secondary | ICD-10-CM | POA: Diagnosis not present

## 2014-12-23 DIAGNOSIS — E7879 Other disorders of bile acid and cholesterol metabolism: Secondary | ICD-10-CM | POA: Diagnosis not present

## 2014-12-30 DIAGNOSIS — M549 Dorsalgia, unspecified: Secondary | ICD-10-CM | POA: Diagnosis not present

## 2014-12-30 DIAGNOSIS — E118 Type 2 diabetes mellitus with unspecified complications: Secondary | ICD-10-CM | POA: Diagnosis not present

## 2014-12-30 DIAGNOSIS — I10 Essential (primary) hypertension: Secondary | ICD-10-CM | POA: Diagnosis not present

## 2014-12-31 DIAGNOSIS — H00021 Hordeolum internum right upper eyelid: Secondary | ICD-10-CM | POA: Diagnosis not present

## 2015-01-09 DIAGNOSIS — Z981 Arthrodesis status: Secondary | ICD-10-CM | POA: Diagnosis not present

## 2015-01-09 DIAGNOSIS — M5416 Radiculopathy, lumbar region: Secondary | ICD-10-CM | POA: Diagnosis not present

## 2015-01-09 DIAGNOSIS — G8928 Other chronic postprocedural pain: Secondary | ICD-10-CM | POA: Diagnosis not present

## 2015-01-15 ENCOUNTER — Other Ambulatory Visit: Payer: Self-pay | Admitting: Physical Medicine and Rehabilitation

## 2015-01-15 DIAGNOSIS — G8928 Other chronic postprocedural pain: Secondary | ICD-10-CM

## 2015-01-15 DIAGNOSIS — M5416 Radiculopathy, lumbar region: Secondary | ICD-10-CM

## 2015-01-27 ENCOUNTER — Ambulatory Visit
Admission: RE | Admit: 2015-01-27 | Discharge: 2015-01-27 | Disposition: A | Discharge status: Home / Self Care | Payer: Medicare Other | Source: Ambulatory Visit | Attending: Physical Medicine and Rehabilitation | Admitting: Physical Medicine and Rehabilitation

## 2015-01-27 DIAGNOSIS — M5416 Radiculopathy, lumbar region: Secondary | ICD-10-CM

## 2015-01-27 DIAGNOSIS — M47816 Spondylosis without myelopathy or radiculopathy, lumbar region: Secondary | ICD-10-CM | POA: Diagnosis not present

## 2015-01-27 DIAGNOSIS — G8928 Other chronic postprocedural pain: Secondary | ICD-10-CM

## 2015-01-27 DIAGNOSIS — M5136 Other intervertebral disc degeneration, lumbar region: Secondary | ICD-10-CM | POA: Diagnosis not present

## 2015-01-27 MED ORDER — GADOBENATE DIMEGLUMINE 529 MG/ML IV SOLN
15.0000 mL | Freq: Once | INTRAVENOUS | Status: AC | PRN
  Administered 2015-01-27: 15 mL via INTRAVENOUS

## 2015-02-04 ENCOUNTER — Encounter: Payer: Self-pay | Admitting: Podiatry

## 2015-02-04 ENCOUNTER — Ambulatory Visit (INDEPENDENT_AMBULATORY_CARE_PROVIDER_SITE_OTHER): Payer: Medicare Other | Admitting: Podiatry

## 2015-02-04 VITALS — BP 128/76 | HR 82

## 2015-02-04 DIAGNOSIS — M79673 Pain in unspecified foot: Secondary | ICD-10-CM

## 2015-02-04 DIAGNOSIS — L84 Corns and callosities: Secondary | ICD-10-CM

## 2015-02-04 MED ORDER — UREA 39 % EX CREA: 1.0000 "application " | TOPICAL_CREAM | Freq: Every day | CUTANEOUS | Status: DC

## 2015-02-04 NOTE — Progress Notes (Signed)
   Subjective:    Patient ID: Bonnie [name], female    DOB: 12/13/1955, 59 y.o.   MRN: [id number]  HPI 59 year old female presents the office in follow-up evaluation of continue pain on the calluses to both of her feet. She has a states she has some tenderness on the nail bed sites from the callus. She states that after last appointment once the calluses are removed she has resolution of pain however over time the callouses reoccur and she has returned discomfort. Denies any redness or drainage around the sites. No other complaints at this time.   Review of Systems  All other systems reviewed and are negative.      Objective:   Physical Exam AAO x3, NAD DP/PT pulses palpable bilaterally, CRT less than 3 seconds Protective sensation intact with Simms Weinstein monofilament, vibratory sensation intact, Achilles tendon reflex intact Hyperkeratotic lesions right foot submetatarsal 3/5, left plantar heel, bilateral medial hallux nail borders. Upon debridement there is no underlying ulceration, drainage or other clinical signs of infection. There was a small amount of dried blood within the callus and the right submetatarsal 3. Small amount of bleeding occurred during the debridement there is no signs of infection. No other areas of tenderness to bilateral lower extremities. MMT 5/5, ROM WNL.  No open lesions or pre-ulcerative lesions.  No overlying edema, erythema, increase in warmth to bilateral lower extremities.  No pain with calf compression, swelling, warmth, erythema bilaterally.      Assessment & Plan:  59 year old female presents for symptomatic hyperkeratotic lesions 5 -Treatment options discussed include alternatives, risks, complications -Lesion sharply debrided without complications. Small nonbleeding right submetatarsal 3. Area was cleaned and antibiotic ointment was applied and a Band-Aid. Recommend patient continues to the area has good be healed. If the area has not healed in 2  weeks or causes any problems in the meantime call the office. -Discussed daily foot inspection. -Rx Urea 39% cream for the heel -Follow-up in 2 months per patients request or sooner should any problems arise.

## 2015-02-23 DIAGNOSIS — E118 Type 2 diabetes mellitus with unspecified complications: Secondary | ICD-10-CM | POA: Diagnosis not present

## 2015-02-23 DIAGNOSIS — R109 Unspecified abdominal pain: Secondary | ICD-10-CM | POA: Diagnosis not present

## 2015-02-23 DIAGNOSIS — M546 Pain in thoracic spine: Secondary | ICD-10-CM | POA: Diagnosis not present

## 2015-02-23 DIAGNOSIS — E789 Disorder of lipoprotein metabolism, unspecified: Secondary | ICD-10-CM | POA: Diagnosis not present

## 2015-02-26 DIAGNOSIS — R109 Unspecified abdominal pain: Secondary | ICD-10-CM | POA: Diagnosis not present

## 2015-03-19 ENCOUNTER — Ambulatory Visit
Admission: RE | Admit: 2015-03-19 | Discharge: 2015-03-19 | Disposition: A | Discharge status: Home / Self Care | Payer: Medicare Other | Source: Ambulatory Visit | Attending: Neurosurgery | Admitting: Neurosurgery

## 2015-03-19 ENCOUNTER — Other Ambulatory Visit: Payer: Self-pay | Admitting: Neurosurgery

## 2015-03-19 DIAGNOSIS — D329 Benign neoplasm of meninges, unspecified: Secondary | ICD-10-CM

## 2015-03-19 MED ORDER — GADOBENATE DIMEGLUMINE 529 MG/ML IV SOLN
15.0000 mL | Freq: Once | INTRAVENOUS | Status: AC | PRN
  Administered 2015-03-19: 15 mL via INTRAVENOUS

## 2015-04-08 DIAGNOSIS — G629 Polyneuropathy, unspecified: Secondary | ICD-10-CM | POA: Diagnosis not present

## 2015-04-08 DIAGNOSIS — J209 Acute bronchitis, unspecified: Secondary | ICD-10-CM | POA: Diagnosis not present

## 2015-04-15 ENCOUNTER — Ambulatory Visit: Payer: Medicare Other | Admitting: Podiatry

## 2015-04-28 DIAGNOSIS — E118 Type 2 diabetes mellitus with unspecified complications: Secondary | ICD-10-CM | POA: Diagnosis not present

## 2015-05-05 DIAGNOSIS — E118 Type 2 diabetes mellitus with unspecified complications: Secondary | ICD-10-CM | POA: Diagnosis not present

## 2015-05-05 DIAGNOSIS — G2581 Restless legs syndrome: Secondary | ICD-10-CM | POA: Diagnosis not present

## 2015-05-05 DIAGNOSIS — M549 Dorsalgia, unspecified: Secondary | ICD-10-CM | POA: Diagnosis not present

## 2015-05-05 DIAGNOSIS — E789 Disorder of lipoprotein metabolism, unspecified: Secondary | ICD-10-CM | POA: Diagnosis not present

## 2015-05-19 DIAGNOSIS — D329 Benign neoplasm of meninges, unspecified: Secondary | ICD-10-CM | POA: Diagnosis not present

## 2015-05-19 DIAGNOSIS — M542 Cervicalgia: Secondary | ICD-10-CM | POA: Diagnosis not present

## 2015-06-22 DIAGNOSIS — H5213 Myopia, bilateral: Secondary | ICD-10-CM | POA: Diagnosis not present

## 2015-06-22 DIAGNOSIS — H40022 Open angle with borderline findings, high risk, left eye: Secondary | ICD-10-CM | POA: Diagnosis not present

## 2015-06-22 DIAGNOSIS — H2513 Age-related nuclear cataract, bilateral: Secondary | ICD-10-CM | POA: Diagnosis not present

## 2015-06-22 DIAGNOSIS — H52203 Unspecified astigmatism, bilateral: Secondary | ICD-10-CM | POA: Diagnosis not present

## 2015-07-23 DIAGNOSIS — E118 Type 2 diabetes mellitus with unspecified complications: Secondary | ICD-10-CM | POA: Diagnosis not present

## 2015-08-20 DIAGNOSIS — R05 Cough: Secondary | ICD-10-CM | POA: Diagnosis not present

## 2015-08-20 DIAGNOSIS — E118 Type 2 diabetes mellitus with unspecified complications: Secondary | ICD-10-CM | POA: Diagnosis not present

## 2015-08-31 ENCOUNTER — Ambulatory Visit (INDEPENDENT_AMBULATORY_CARE_PROVIDER_SITE_OTHER): Payer: Medicare Other | Admitting: Podiatry

## 2015-08-31 ENCOUNTER — Encounter: Payer: Self-pay | Admitting: Podiatry

## 2015-08-31 DIAGNOSIS — Q828 Other specified congenital malformations of skin: Secondary | ICD-10-CM | POA: Diagnosis not present

## 2015-08-31 NOTE — Progress Notes (Signed)
Patient ID: SAPNA PADRON, female   DOB: June 28, 1956, 59 y.o.   MRN: [id number]  Subjective:  59 year old female presents the office today for complaints of painful, reoccurring calluses to both of her feet. She states that between appointment she does key moisturizer on tries to shave the areas down herself after showering. She denies any open sores at that time he denies any drainage or redness around the area. She does have pain to the area particularly weightbearing and pressure at times however the pain is intermittent. No swelling. No other complaints at this time. She denies any systemic complaints such as fevers, chills, nausea, vomiting. No calf pain, chest pain, soreness of breath.   Objective:  AAO 3, NAD  DP/PT pulses are palpable 2/4, CRT less than 3 seconds  Protective sensation intact with Derrel Nip monofilament  Hyperkeratotic lesions are present bilateral submetatarsal 1, 3, 5 and left plantar heel. Upon debridement there is no underlying ulceration, drainage or other signs of infection. There are no other open lesions or pre-ulcer lesions identified at this time.  The nails appear to be well manicured this time there is no tenderness to palpation. Is no surrounding erythema or drainage.  No areas of tenderness to bilateral lower x-rays otherwise. There is no pinpoint bony tenderness or pain the vibratory sensation.  No pain with calf compression, swelling, warmth, erythema.   Assessment:  59 year old female with hyperkeratotic lesions 7   Plan:  Keratotic lesions were sharply debrided 7 without complication/bleeding. Continue moisturizer daily however do not apply interdigitally. Monitoring skin breakdown. His A changes to call the office immediately. Follow-up as needed. Call any questions or concerns in the meantime.  Celesta Gentile, DPM

## 2015-09-01 DIAGNOSIS — E118 Type 2 diabetes mellitus with unspecified complications: Secondary | ICD-10-CM | POA: Diagnosis not present

## 2015-09-01 DIAGNOSIS — E7879 Other disorders of bile acid and cholesterol metabolism: Secondary | ICD-10-CM | POA: Diagnosis not present

## 2015-09-02 DIAGNOSIS — Z01419 Encounter for gynecological examination (general) (routine) without abnormal findings: Secondary | ICD-10-CM | POA: Diagnosis not present

## 2015-09-02 DIAGNOSIS — Z1231 Encounter for screening mammogram for malignant neoplasm of breast: Secondary | ICD-10-CM | POA: Diagnosis not present

## 2015-09-08 DIAGNOSIS — F4320 Adjustment disorder, unspecified: Secondary | ICD-10-CM | POA: Diagnosis not present

## 2015-09-08 DIAGNOSIS — Z23 Encounter for immunization: Secondary | ICD-10-CM | POA: Diagnosis not present

## 2015-09-08 DIAGNOSIS — E118 Type 2 diabetes mellitus with unspecified complications: Secondary | ICD-10-CM | POA: Diagnosis not present

## 2015-09-08 DIAGNOSIS — E789 Disorder of lipoprotein metabolism, unspecified: Secondary | ICD-10-CM | POA: Diagnosis not present

## 2015-09-15 DIAGNOSIS — Z23 Encounter for immunization: Secondary | ICD-10-CM | POA: Diagnosis not present

## 2015-10-14 DIAGNOSIS — H04123 Dry eye syndrome of bilateral lacrimal glands: Secondary | ICD-10-CM | POA: Diagnosis not present

## 2015-10-14 DIAGNOSIS — G245 Blepharospasm: Secondary | ICD-10-CM | POA: Diagnosis not present

## 2015-11-12 DIAGNOSIS — R202 Paresthesia of skin: Secondary | ICD-10-CM | POA: Diagnosis not present

## 2015-11-12 DIAGNOSIS — I10 Essential (primary) hypertension: Secondary | ICD-10-CM | POA: Diagnosis not present

## 2015-11-12 DIAGNOSIS — E7800 Pure hypercholesterolemia, unspecified: Secondary | ICD-10-CM | POA: Diagnosis not present

## 2015-11-12 DIAGNOSIS — E559 Vitamin D deficiency, unspecified: Secondary | ICD-10-CM | POA: Diagnosis not present

## 2015-11-12 DIAGNOSIS — E1165 Type 2 diabetes mellitus with hyperglycemia: Secondary | ICD-10-CM | POA: Diagnosis not present

## 2015-11-23 DIAGNOSIS — H40023 Open angle with borderline findings, high risk, bilateral: Secondary | ICD-10-CM | POA: Diagnosis not present

## 2015-11-23 DIAGNOSIS — E119 Type 2 diabetes mellitus without complications: Secondary | ICD-10-CM | POA: Diagnosis not present

## 2015-11-23 DIAGNOSIS — H2513 Age-related nuclear cataract, bilateral: Secondary | ICD-10-CM | POA: Diagnosis not present

## 2015-12-04 ENCOUNTER — Encounter: Payer: Self-pay | Admitting: Podiatry

## 2015-12-04 ENCOUNTER — Ambulatory Visit (INDEPENDENT_AMBULATORY_CARE_PROVIDER_SITE_OTHER): Payer: Medicare Other | Admitting: Podiatry

## 2015-12-04 DIAGNOSIS — B351 Tinea unguium: Secondary | ICD-10-CM

## 2015-12-04 DIAGNOSIS — Q828 Other specified congenital malformations of skin: Secondary | ICD-10-CM | POA: Diagnosis not present

## 2015-12-04 DIAGNOSIS — E1149 Type 2 diabetes mellitus with other diabetic neurological complication: Secondary | ICD-10-CM

## 2015-12-04 DIAGNOSIS — M79673 Pain in unspecified foot: Secondary | ICD-10-CM | POA: Diagnosis not present

## 2015-12-04 DIAGNOSIS — L600 Ingrowing nail: Secondary | ICD-10-CM

## 2015-12-06 NOTE — Progress Notes (Signed)
Patient ID: Bonnie [name], female   DOB: 12-11-1955, 60 y.o.   MRN: [id number]  Subjective:  60 year old female presents the office today for complaints of painful, reoccurring calluses to both of her feet. She states the calluses are painful to take the pressure. Her right big toenail is starting to curve down into her skin causing some discomfort. Denies any redness or drainage the callus site or the nail. No other complaints at this time. She denies any systemic complaints such as fevers, chills, nausea, vomiting. No calf pain, chest pain, soreness of breath.  Objective: AAO 3, NAD DP/PT pulses are palpable 2/4, CRT less than 3 seconds Hyperkeratotic lesions are present bilateral submetatarsal 1, 3, 5 and left plantar heel. Upon debridement there is no underlying ulceration, drainage or other signs of infection. There are no other open lesions or pre-ulcer lesions identified at this time. Right hallux toenail slightly incurvated with tenderness to the area and solid dystrophic and discolored. No swelling edema, erythema, drainage or pus. Other nails appear to be well manicured this time there is no tenderness to palpation. Is no surrounding erythema or drainage. No areas of tenderness to bilateral lower extremities otherwise. There is no pinpoint bony tenderness or pain the vibratory sensation. No pain with calf compression, swelling, warmth, erythema.  Assessment: 60 year old female with hyperkeratotic lesions 7; ingrown toenail  Plan: -Treatment options discussed including all alternatives, risks, and complications -Hyperkeratotic lesions were sharply debrided 7 without complication/bleeding. Continue moisturizer daily however do not apply interdigitally. Monitoring skin breakdown. -Right hallux nail debrided without complications or bleeding. There is resolution of symptoms after debridement. -Follow-up in 3 months or sooner if any problems arise. In the meantime, encouraged to call the  office with any questions, concerns, change in symptoms.   Celesta Gentile, DPM

## 2015-12-07 ENCOUNTER — Ambulatory Visit (INDEPENDENT_AMBULATORY_CARE_PROVIDER_SITE_OTHER): Payer: Self-pay | Admitting: Neurology

## 2015-12-07 ENCOUNTER — Encounter: Payer: Self-pay | Admitting: Neurology

## 2015-12-07 ENCOUNTER — Ambulatory Visit (INDEPENDENT_AMBULATORY_CARE_PROVIDER_SITE_OTHER): Payer: Medicare Other | Admitting: Neurology

## 2015-12-07 DIAGNOSIS — G5601 Carpal tunnel syndrome, right upper limb: Secondary | ICD-10-CM

## 2015-12-07 DIAGNOSIS — G5600 Carpal tunnel syndrome, unspecified upper limb: Secondary | ICD-10-CM

## 2015-12-07 DIAGNOSIS — G5602 Carpal tunnel syndrome, left upper limb: Secondary | ICD-10-CM

## 2015-12-07 DIAGNOSIS — G5603 Carpal tunnel syndrome, bilateral upper limbs: Secondary | ICD-10-CM

## 2015-12-07 HISTORY — DX: Carpal tunnel syndrome, unspecified upper limb: G56.00

## 2015-12-07 NOTE — Procedures (Signed)
     HISTORY:  Nataysia Feliciano is a 60 year old patient with a history of diabetes with a one-year history of numbness in both hands, right greater than left. The patient has some discomfort in the right neck and shoulder area as well, without radiation down the arm. The patient is being evaluated for a possible neuropathy or a cervical radiculopathy. She does report numbness and discomfort in the feet consistent with a peripheral neuropathy.  NERVE CONDUCTION STUDIES:  Nerve conduction studies were performed on both upper extremities. The distal motor latencies for the median nerves were prolonged bilaterally, with normal motor amplitudes for these nerves bilaterally. The distal motor latencies and motor amplitudes for the ulnar nerves were normal bilaterally. The F wave latencies and nerve conduction velocities for the median and ulnar nerves were normal bilaterally. The sensory latencies for the median nerves were prolonged bilaterally, normal for the ulnar nerves bilaterally.  EMG STUDIES:  EMG study was performed on the right upper extremity:  The first dorsal interosseous muscle reveals 2 to 4 K units with full recruitment. No fibrillations or positive waves were noted. The abductor pollicis brevis muscle reveals 2 to 4 K units with full recruitment. No fibrillations or positive waves were noted. The extensor indicis proprius muscle reveals 1 to 3 K units with full recruitment. No fibrillations or positive waves were noted. The pronator teres muscle reveals 2 to 3 K units with full recruitment. No fibrillations or positive waves were noted. The biceps muscle reveals 1 to 2 K units with full recruitment. No fibrillations or positive waves were noted. The triceps muscle reveals 2 to 4 K units with full recruitment. No fibrillations or positive waves were noted. The anterior deltoid muscle reveals 2 to 3 K units with full recruitment. No fibrillations or positive waves were noted. The cervical  paraspinal muscles were tested at 2 levels. No abnormalities of insertional activity were seen at either level tested. There was good relaxation.  A limited EMG study was performed on the left upper extremity:  The first dorsal interosseous muscle reveals 2 to 4 K units with full recruitment. No fibrillations or positive waves were noted. The abductor pollicis brevis muscle reveals 2 to 4 K units with full recruitment. No fibrillations or positive waves were noted. The extensor indicis proprius muscle reveals 1 to 3 K units with full recruitment. No fibrillations or positive waves were noted.   IMPRESSION:  Nerve conduction studies done on both upper extremities shows evidence of bilateral carpal tunnel syndrome of mild severity. EMG evaluation of the right upper extremity is unremarkable, without evidence of an overlying cervical radiculopathy. A limited EMG of the left upper extremity was unremarkable.  Jill Alexanders MD 12/07/2015 12:41 PM  [facility] [address] [place], [place] [address]  Phone [phone] Fax [phone]

## 2015-12-07 NOTE — Progress Notes (Signed)
Please refer to EMG and nerve conduction study procedure note. 

## 2015-12-23 DIAGNOSIS — R339 Retention of urine, unspecified: Secondary | ICD-10-CM | POA: Diagnosis not present

## 2015-12-23 DIAGNOSIS — I10 Essential (primary) hypertension: Secondary | ICD-10-CM | POA: Diagnosis not present

## 2015-12-23 DIAGNOSIS — E1165 Type 2 diabetes mellitus with hyperglycemia: Secondary | ICD-10-CM | POA: Diagnosis not present

## 2015-12-23 DIAGNOSIS — M545 Low back pain: Secondary | ICD-10-CM | POA: Diagnosis not present

## 2016-01-05 DIAGNOSIS — R3914 Feeling of incomplete bladder emptying: Secondary | ICD-10-CM | POA: Diagnosis not present

## 2016-01-07 DIAGNOSIS — M5417 Radiculopathy, lumbosacral region: Secondary | ICD-10-CM | POA: Diagnosis not present

## 2016-01-07 DIAGNOSIS — M961 Postlaminectomy syndrome, not elsewhere classified: Secondary | ICD-10-CM | POA: Diagnosis not present

## 2016-01-07 DIAGNOSIS — M545 Low back pain: Secondary | ICD-10-CM | POA: Diagnosis not present

## 2016-01-07 DIAGNOSIS — G894 Chronic pain syndrome: Secondary | ICD-10-CM | POA: Diagnosis not present

## 2016-01-07 DIAGNOSIS — M5137 Other intervertebral disc degeneration, lumbosacral region: Secondary | ICD-10-CM | POA: Diagnosis not present

## 2016-02-11 DIAGNOSIS — S6981XA Other specified injuries of right wrist, hand and finger(s), initial encounter: Secondary | ICD-10-CM | POA: Diagnosis not present

## 2016-02-11 DIAGNOSIS — G5602 Carpal tunnel syndrome, left upper limb: Secondary | ICD-10-CM | POA: Diagnosis not present

## 2016-02-11 DIAGNOSIS — G5601 Carpal tunnel syndrome, right upper limb: Secondary | ICD-10-CM | POA: Diagnosis not present

## 2016-02-18 DIAGNOSIS — M545 Low back pain: Secondary | ICD-10-CM | POA: Diagnosis not present

## 2016-02-18 DIAGNOSIS — I10 Essential (primary) hypertension: Secondary | ICD-10-CM | POA: Diagnosis not present

## 2016-02-22 DIAGNOSIS — M25531 Pain in right wrist: Secondary | ICD-10-CM | POA: Diagnosis not present

## 2016-02-22 DIAGNOSIS — G5601 Carpal tunnel syndrome, right upper limb: Secondary | ICD-10-CM | POA: Diagnosis not present

## 2016-02-29 DIAGNOSIS — G4700 Insomnia, unspecified: Secondary | ICD-10-CM | POA: Diagnosis not present

## 2016-02-29 DIAGNOSIS — E789 Disorder of lipoprotein metabolism, unspecified: Secondary | ICD-10-CM | POA: Diagnosis not present

## 2016-02-29 DIAGNOSIS — G5601 Carpal tunnel syndrome, right upper limb: Secondary | ICD-10-CM | POA: Diagnosis not present

## 2016-02-29 DIAGNOSIS — S6981XD Other specified injuries of right wrist, hand and finger(s), subsequent encounter: Secondary | ICD-10-CM | POA: Diagnosis not present

## 2016-02-29 DIAGNOSIS — G5602 Carpal tunnel syndrome, left upper limb: Secondary | ICD-10-CM | POA: Diagnosis not present

## 2016-02-29 DIAGNOSIS — E118 Type 2 diabetes mellitus with unspecified complications: Secondary | ICD-10-CM | POA: Diagnosis not present

## 2016-03-07 ENCOUNTER — Ambulatory Visit (INDEPENDENT_AMBULATORY_CARE_PROVIDER_SITE_OTHER): Payer: Medicare Other | Admitting: Sports Medicine

## 2016-03-07 ENCOUNTER — Encounter: Payer: Self-pay | Admitting: Sports Medicine

## 2016-03-07 DIAGNOSIS — E1149 Type 2 diabetes mellitus with other diabetic neurological complication: Secondary | ICD-10-CM

## 2016-03-07 DIAGNOSIS — B351 Tinea unguium: Secondary | ICD-10-CM

## 2016-03-07 DIAGNOSIS — M79673 Pain in unspecified foot: Secondary | ICD-10-CM | POA: Diagnosis not present

## 2016-03-07 DIAGNOSIS — Q828 Other specified congenital malformations of skin: Secondary | ICD-10-CM

## 2016-03-07 DIAGNOSIS — D573 Sickle-cell trait: Secondary | ICD-10-CM | POA: Insufficient documentation

## 2016-03-07 DIAGNOSIS — M542 Cervicalgia: Secondary | ICD-10-CM | POA: Insufficient documentation

## 2016-03-07 NOTE — Addendum Note (Signed)
Addended by: Cranford Mon R on: 03/07/2016 05:00 PM   Modules accepted: Medications

## 2016-03-07 NOTE — Progress Notes (Signed)
Patient ID: Bonnie [name], female   DOB: 1955/12/15, 60 y.o.   MRN: [id number] Subjective: Bonnie [name] is a 60 y.o. female patient with history of diabetes who presents to office today complaining of [name], painful nails and callus while ambulating in shoes; unable to trim. Patient states that the glucose reading this morning was 139 mg/dl. Patient denies any new changes in medication or new problems. Patient denies any new cramping, numbness, burning or tingling in the legs has burning sensation to toes on Klonopin qhs.  Patient Active Problem List   Diagnosis Date Noted  . Cervical pain 03/07/2016  . Sickle cell trait ([place]) 03/07/2016  . Carpal tunnel syndrome 12/07/2015  . Pain in female genitalia on intercourse 09/17/2014  . Nausea with vomiting 04/22/2014  . Diarrhea 04/22/2014  . Essential hypertension, benign 04/20/2014  . Ileitis 04/19/2014  . Cataract 12/25/2013  . Dry eye syndrome 12/25/2013  . Chronic LBP 02/27/2013  . Hernia, rectovaginal 02/12/2013  . Dysfunctional voiding of urine 02/12/2013  . Bladder cystocele 12/06/2012  . Diabetes mellitus ([place]) 12/06/2012  . HLD (hyperlipidemia) 12/06/2012  . Benign neoplasm of meninges ([place]) 12/06/2012  . Mixed incontinence 12/06/2012  . Open angle with borderline findings, high risk 10/17/2011  . Chest pain, atypical 10/01/2011  . Shortness of breath 10/01/2011  . DM type 2 (diabetes mellitus, type 2) ([place]) 10/01/2011  . Hypercholesteremia   . Anemia   . Obesity   . Anxiety   . GERD (gastroesophageal reflux disease)    Current Outpatient Prescriptions on File Prior to Visit  Medication Sig Dispense Refill  . carboxymethylcellulose (REFRESH TEARS) 0.5 % SOLN 1 drop See admin instructions. Instill 1 drop in both eyes every morning and 1 drop in right eye every night at bedtime    . carboxymethylcellulose (REFRESH) 1 % ophthalmic solution Place 1 drop into the left eye at bedtime.    . clonazePAM (KLONOPIN) 0.5 MG tablet Take 1  tablet (0.5 mg total) by mouth at bedtime as needed (insomnia). 20 tablet 0  . Dapagliflozin Propanediol (FARXIGA) 10 MG TABS Take 5 mg by mouth daily.    Marland Kitchen dexlansoprazole (DEXILANT) 60 MG capsule Take 60 mg by mouth daily.     Marland Kitchen HYDROcodone-acetaminophen (NORCO) 5-325 MG per tablet Take 0.5-2 tablets by mouth 3 (three) times daily as needed. 1/2 tablet for mild pain, 1 tablet for moderate pain, 2 tablets for severe pain    . Liraglutide (VICTOZA) 18 MG/3ML SOPN Inject 7.2 mg into the skin daily. 1.2 mls    . metFORMIN (GLUCOPHAGE) 1000 MG tablet Take 1,000 mg by mouth 2 (two) times daily with a meal.     . metoCLOPramide (REGLAN) 10 MG tablet Take 1 tablet (10 mg total) by mouth every 6 (six) hours. (Patient not taking: Reported on 03/07/2016) 30 tablet 0  . ramipril (ALTACE) 5 MG capsule Take 5 mg by mouth daily.    . simvastatin (ZOCOR) 40 MG tablet Take 20 mg by mouth daily at 6 PM.     . Urea 39 % CREA Apply 1 application topically daily. 1 Bottle 2  . valACYclovir (VALTREX) 1000 MG tablet Take 500 mg by mouth daily. For outbreak    . vitamin C (ASCORBIC ACID) 500 MG tablet Take 500 mg by mouth daily.     No current facility-administered medications on file prior to visit.   Allergies  Allergen Reactions  . Biaxin [Clarithromycin] Swelling and Other (See Comments)    Throat swelling Other  Reaction: laryngeal edema Throat swelling   . Exenatide Rash and Other (See Comments)    Caused Sweating, sores in the mouth  Also gum soreness/irritation  . Ampicillin Nausea And Vomiting  . Carafate [Sucralfate] Nausea And Vomiting  . Metronidazole Nausea And Vomiting and Nausea Only    No results found for this or any previous visit (from the past 2160 hour(s)).  Objective: General: Patient is awake, alert, and oriented x 3 and in no acute distress.  Integument: Skin is warm, dry and supple bilateral. Nails are tender, mildly elongated, thickened and  dystrophic with subungual debris,  consistent with onychomycosis, 1-5 bilateral. No signs of infection. No open lesions. + callus with nucleated core consistent with porokeratosis present sub met 1,3,5 bilateral and right medial hallux with no signs of infection.  Dry skin to left heel. Remaining integument unremarkable.  Vasculature:  Dorsalis Pedis pulse 2/4 bilateral. Posterior Tibial pulse  1/4 bilateral.  Capillary fill time <3 sec 1-5 bilateral. Scant hair growth to the level of the digits. Temperature gradient within normal limits. No varicosities present bilateral. No edema present bilateral.   Neurology: The patient has intact sensation measured with a 5.07/10g Semmes Weinstein Monofilament at all pedal sites bilateral . Vibratory sensation diminished bilateral with tuning fork. No Babinski sign present bilateral.   Musculoskeletal: Mild hammertoes and fat pad atrophy/prominent met(s) deformities noted bilateral. Muscular strength 5/5 in all lower extremity muscular groups bilateral without pain on range of motion . No tenderness with calf compression bilateral.  Assessment and Plan: Problem List Items Addressed This Visit    None    Visit Diagnoses    Porokeratosis    -  Primary    Dermatophytosis of nail        Diabetes mellitus type 2 with neurological manifestations (HCC)        Foot pain, unspecified laterality           -Examined patient. -Discussed and educated patient on diabetic foot care, especially with  regards to the vascular, neurological and musculoskeletal systems.  -Stressed the importance of good glycemic control and the detriment of not  controlling glucose levels in relation to the foot. -Mechanically debrided callus x 7 using sterile chisel blade and all nails 1-5 bilateral using sterile nail nipper and filed with dremel without incident  -Recommend daily skin emollients; okeeffe's healthy feet and vit E oil -Vinegar soaks as needed to help with nail thickness and discoloration -Cont with  Klonopin for nerve pain will consider other treatments if fail to provide relief -Recommend good supportive shoes daily  -Answered all patient questions -Patient to return in 3 months for at risk foot care -Patient advised to call the office if any problems or questions arise in the  Meantime.  Landis Martins, DPM

## 2016-06-13 ENCOUNTER — Ambulatory Visit (INDEPENDENT_AMBULATORY_CARE_PROVIDER_SITE_OTHER): Payer: Medicare Other | Admitting: Sports Medicine

## 2016-06-13 ENCOUNTER — Encounter: Payer: Self-pay | Admitting: Sports Medicine

## 2016-06-13 ENCOUNTER — Ambulatory Visit: Payer: Medicare Other | Admitting: Sports Medicine

## 2016-06-13 DIAGNOSIS — Q828 Other specified congenital malformations of skin: Secondary | ICD-10-CM | POA: Diagnosis not present

## 2016-06-13 DIAGNOSIS — E1149 Type 2 diabetes mellitus with other diabetic neurological complication: Secondary | ICD-10-CM

## 2016-06-13 DIAGNOSIS — M79673 Pain in unspecified foot: Secondary | ICD-10-CM

## 2016-06-13 NOTE — Progress Notes (Signed)
Patient ID: Bonnie [name], female   DOB: 03-23-1956, 60 y.o.   MRN: [id number] Subjective: Bonnie [name] is a 60 y.o. female patient with history of diabetes who presents to office today complaining of painful callus while ambulating in shoes; unable to trim. Patient states that the glucose reading this morning was 117 mg/dl. Patient denies any new changes in medication or new problems. Patient denies any new cramping, numbness, burning or tingling in the legs has burning sensation to toes on Klonopin qhs and wants to talk about other meds she can try. No other issues.  Patient Active Problem List   Diagnosis Date Noted  . Cervical pain 03/07/2016  . Sickle cell trait ([place]) 03/07/2016  . Carpal tunnel syndrome 12/07/2015  . Pain in female genitalia on intercourse 09/17/2014  . Nausea with vomiting 04/22/2014  . Diarrhea 04/22/2014  . Essential hypertension, benign 04/20/2014  . Ileitis 04/19/2014  . Cataract 12/25/2013  . Dry eye syndrome 12/25/2013  . Chronic LBP 02/27/2013  . Hernia, rectovaginal 02/12/2013  . Dysfunctional voiding of urine 02/12/2013  . Bladder cystocele 12/06/2012  . Diabetes mellitus ([place]) 12/06/2012  . HLD (hyperlipidemia) 12/06/2012  . Benign neoplasm of meninges ([place]) 12/06/2012  . Mixed incontinence 12/06/2012  . Open angle with borderline findings, high risk 10/17/2011  . Chest pain, atypical 10/01/2011  . Shortness of breath 10/01/2011  . DM type 2 (diabetes mellitus, type 2) ([place]) 10/01/2011  . Hypercholesteremia   . Anemia   . Obesity   . Anxiety   . GERD (gastroesophageal reflux disease)    Current Outpatient Prescriptions on File Prior to Visit  Medication Sig Dispense Refill  . carboxymethylcellulose (REFRESH TEARS) 0.5 % SOLN 1 drop See admin instructions. Instill 1 drop in both eyes every morning and 1 drop in right eye every night at bedtime    . carboxymethylcellulose (REFRESH) 1 % ophthalmic solution Place 1 drop into the left eye at bedtime.    .  clonazePAM (KLONOPIN) 0.5 MG tablet Take 1 tablet (0.5 mg total) by mouth at bedtime as needed (insomnia). 20 tablet 0  . Dapagliflozin Propanediol (FARXIGA) 10 MG TABS Take 5 mg by mouth daily.    Marland Kitchen dexlansoprazole (DEXILANT) 60 MG capsule Take 60 mg by mouth daily.     Marland Kitchen HYDROcodone-acetaminophen (NORCO) 5-325 MG per tablet Take 0.5-2 tablets by mouth 3 (three) times daily as needed. 1/2 tablet for mild pain, 1 tablet for moderate pain, 2 tablets for severe pain    . Liraglutide (VICTOZA) 18 MG/3ML SOPN Inject 7.2 mg into the skin daily. 1.2 mls    . metFORMIN (GLUCOPHAGE) 1000 MG tablet Take 1,000 mg by mouth 2 (two) times daily with a meal.     . metoCLOPramide (REGLAN) 10 MG tablet Take 1 tablet (10 mg total) by mouth every 6 (six) hours. (Patient not taking: Reported on 03/07/2016) 30 tablet 0  . ramipril (ALTACE) 5 MG capsule Take 5 mg by mouth daily.    . simvastatin (ZOCOR) 40 MG tablet Take 20 mg by mouth daily at 6 PM.     . Urea 39 % CREA Apply 1 application topically daily. 1 Bottle 2  . valACYclovir (VALTREX) 1000 MG tablet Take 500 mg by mouth daily. For outbreak    . vitamin C (ASCORBIC ACID) 500 MG tablet Take 500 mg by mouth daily.     No current facility-administered medications on file prior to visit.    Allergies  Allergen Reactions  . Biaxin [Clarithromycin]  Swelling and Other (See Comments)    Throat swelling Other Reaction: laryngeal edema Throat swelling   . Exenatide Rash and Other (See Comments)    Caused Sweating, sores in the mouth  Also gum soreness/irritation  . Ampicillin Nausea And Vomiting  . Carafate [Sucralfate] Nausea And Vomiting  . Metronidazole Nausea And Vomiting and Nausea Only    No results found for this or any previous visit (from the past 2160 hour(s)).  Objective: General: Patient is awake, alert, and oriented x 3 and in no acute distress.  Integument: Skin is warm, dry and supple bilateral. Nails are short, thickened and dystrophic  with subungual debris, consistent with onychomycosis, 1-5 bilateral. No signs of infection. No open lesions. + callus with nucleated core consistent with porokeratosis present sub met 1,3,5 bilateral and right medial hallux with no signs of infection.  Dry skin to left heel. Remaining integument unremarkable.  Vasculature:  Dorsalis Pedis pulse 2/4 bilateral. Posterior Tibial pulse  1/4 bilateral.  Capillary fill time <3 sec 1-5 bilateral. Scant hair growth to the level of the digits. Temperature gradient within normal limits. No varicosities present bilateral. No edema present bilateral.   Neurology: The patient has intact sensation measured with a 5.07/10g Semmes Weinstein Monofilament at all pedal sites bilateral . Vibratory sensation diminished bilateral with tuning fork. No Babinski sign present bilateral.   Musculoskeletal: Mild hammertoes and fat pad atrophy/prominent met(s) deformities noted bilateral. Muscular strength 5/5 in all lower extremity muscular groups bilateral without pain on range of motion . No tenderness with calf compression bilateral.  Assessment and Plan: Problem List Items Addressed This Visit    None    Visit Diagnoses    Porokeratosis    -  Primary   Diabetes mellitus type 2 with neurological manifestations (HCC)       Foot pain, unspecified laterality          -Examined patient. -Discussed and educated patient on diabetic foot care, especially with  regards to the vascular, neurological and musculoskeletal systems.  -Stressed the importance of good glycemic control and the detriment of not  controlling glucose levels in relation to the foot. -Mechanically debrided callus x 7 using sterile chisel blade without incident  -Nails short this visit; patient peeled them off; advised patient to refrain from doing so and allow to grow out from trim next visit -Recommend cont with daily skin emollients; okeeffe's healthy feet and vit E oil -Vinegar soaks as needed to  help with nail thickness and discoloration to nails -Cont with Klonopin for nerve pain and recommended OTC capsciacin cream -Recommend good supportive shoes daily  -Answered all patient questions -Patient to return in 3 months for at risk foot care -Patient advised to call the office if any problems or questions arise in the  Meantime.  Landis Martins, DPM

## 2016-06-13 NOTE — Patient Instructions (Signed)
Capsaicin cream for nerve pain over the counter

## 2016-06-17 ENCOUNTER — Encounter (HOSPITAL_COMMUNITY): Payer: Self-pay | Admitting: Emergency Medicine

## 2016-06-17 ENCOUNTER — Ambulatory Visit (HOSPITAL_COMMUNITY)
Admission: EM | Admit: 2016-06-17 | Discharge: 2016-06-17 | Disposition: A | Discharge status: Home / Self Care | Payer: Medicare Other | Attending: Emergency Medicine | Admitting: Emergency Medicine

## 2016-06-17 DIAGNOSIS — M940 Chondrocostal junction syndrome [Tietze]: Secondary | ICD-10-CM | POA: Diagnosis not present

## 2016-06-17 MED ORDER — DICLOFENAC POTASSIUM 50 MG PO TABS: 50.0000 mg | ORAL_TABLET | Freq: Three times a day (TID) | ORAL | 0 refills | Status: DC

## 2016-06-17 NOTE — Discharge Instructions (Signed)
Your pain is coming from the cartilage in your chest wall. Take diclofenac 3 times a day for 5 days, then as needed. Apply ice to the chest wall for 20 minutes 3 times a day for the next 3 days. You should see improvement over the next 2-3 days. If the pain changes or worsens, please go to the emergency room. Follow-up as needed.

## 2016-06-17 NOTE — ED Triage Notes (Signed)
PT reports chest pain and SOB that has been off and on for 5 days. PT reports pain starts after she has been laying down. Pain goes away when she sits upright. PT describes pain as stabbing

## 2016-06-17 NOTE — ED Provider Notes (Signed)
[facility]    CSN: [id number] Arrival date & time: 06/17/16  1821  First Provider Contact:  First MD Initiated Contact with Patient 06/17/16 1924        History   Chief Complaint Chief Complaint  Patient presents with  . Chest Pain    HPI Bonnie [name] is a 60 y.o. female.   She is a 60 year old woman here for evaluation of chest pain. She states this is been an intermittent problem for the last week. It typically will occur in the middle of the night when she gets up to use the restroom. She reports a sharp pain in the center of her chest that is associated with some shortness of breath. She denies associated nausea or dizziness. No diaphoresis. Symptoms resolve within 10-15 minutes spontaneously. Last night, she had a more severe episode that lasted about 45 minutes. She reports the pain was much worse with breathing and movement. Currently, she denies any discomfort or shortness of breath. No recent upper respiratory illness. No injury or trauma.      Past Medical History:  Diagnosis Date  . Anemia   . Angina   . Anxiety   . Carpal tunnel syndrome 12/07/2015   Bilateral  . Diabetes mellitus   . GERD (gastroesophageal reflux disease)   . Hypercholesteremia   . Obesity     Patient Active Problem List   Diagnosis Date Noted  . Cervical pain 03/07/2016  . Sickle cell trait ([place]) 03/07/2016  . Carpal tunnel syndrome 12/07/2015  . Pain in female genitalia on intercourse 09/17/2014  . Nausea with vomiting 04/22/2014  . Diarrhea 04/22/2014  . Essential hypertension, benign 04/20/2014  . Ileitis 04/19/2014  . Cataract 12/25/2013  . Dry eye syndrome 12/25/2013  . Chronic LBP 02/27/2013  . Hernia, rectovaginal 02/12/2013  . Dysfunctional voiding of urine 02/12/2013  . Bladder cystocele 12/06/2012  . Diabetes mellitus ([place]) 12/06/2012  . HLD (hyperlipidemia) 12/06/2012  . Benign neoplasm of meninges ([place]) 12/06/2012  . Mixed incontinence 12/06/2012  . Open  angle with borderline findings, high risk 10/17/2011  . Chest pain, atypical 10/01/2011  . Shortness of breath 10/01/2011  . DM type 2 (diabetes mellitus, type 2) ([place]) 10/01/2011  . Hypercholesteremia   . Anemia   . Obesity   . Anxiety   . GERD (gastroesophageal reflux disease)     Past Surgical History:  Procedure Laterality Date  . ABDOMINAL HYSTERECTOMY    . 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 BLADDER SURGERY    . BRAIN SURGERY    . BRAIN SURGERY     For meningioma.  . COCCYX REMOVAL    . COLONOSCOPY Left 04/22/2014   Procedure: COLONOSCOPY;  Surgeon: [name], MD;  Location: [facility];  Service: Endoscopy;  Laterality: Left;  . SHOULDER SURGERY    . TONSILLECTOMY      OB History    No data available       Home Medications    Prior to Admission medications   Medication Sig Start Date End Date Taking? Authorizing Provider  carboxymethylcellulose (REFRESH TEARS) 0.5 % SOLN 1 drop See admin instructions. Instill 1 drop in both eyes every morning and 1 drop in right eye every night at bedtime    Historical Provider, MD  carboxymethylcellulose (REFRESH) 1 % ophthalmic solution Place 1 drop into the left eye at bedtime.    Historical Provider, MD  clonazePAM (KLONOPIN) 0.5 MG tablet Take 1 tablet (0.5  mg total) by mouth at bedtime as needed (insomnia). 04/22/14   Kelvin Cellar, MD  Dapagliflozin Propanediol (FARXIGA) 10 MG TABS Take 5 mg by mouth daily.    Historical Provider, MD  dexlansoprazole (DEXILANT) 60 MG capsule Take 60 mg by mouth daily.     Historical Provider, MD  diclofenac (CATAFLAM) 50 MG tablet Take 1 tablet (50 mg total) by mouth 3 (three) times daily. For 5 days then as needed for pain 06/17/16   Melony Overly, MD  HYDROcodone-acetaminophen (NORCO) 5-325 MG per tablet Take 0.5-2 tablets by mouth 3 (three) times daily as needed. 1/2 tablet for mild pain, 1 tablet for moderate pain, 2 tablets for severe pain    Historical Provider, MD  Liraglutide (VICTOZA)  18 MG/3ML SOPN Inject 7.2 mg into the skin daily. 1.2 mls    Historical Provider, MD  metFORMIN (GLUCOPHAGE) 1000 MG tablet Take 1,000 mg by mouth 2 (two) times daily with a meal.     Historical Provider, MD  ramipril (ALTACE) 5 MG capsule Take 5 mg by mouth daily.    Historical Provider, MD  simvastatin (ZOCOR) 40 MG tablet Take 20 mg by mouth daily at 6 PM.     Historical Provider, MD  Urea 39 % CREA Apply 1 application topically daily. 02/04/15   Trula Slade, DPM  valACYclovir (VALTREX) 1000 MG tablet Take 500 mg by mouth daily. For outbreak    Historical Provider, MD  vitamin C (ASCORBIC ACID) 500 MG tablet Take 500 mg by mouth daily.    Historical Provider, MD    Family History Family History  Problem Relation Age of Onset  . Diabetes Mother   . Lung cancer Father   . Diabetes Brother     Social History Social History  Substance Use Topics  . Smoking status: Former Research scientist (life sciences)  . Smokeless tobacco: Never Used  . Alcohol use Yes     Allergies   Biaxin [clarithromycin]; Exenatide; Ampicillin; Carafate [sucralfate]; and Metronidazole   Review of Systems Review of Systems  Constitutional: Negative for diaphoresis and fever.  Respiratory: Positive for shortness of breath. Negative for cough.   Cardiovascular: Positive for chest pain. Negative for palpitations.  Gastrointestinal: Negative for nausea.  Neurological: Negative for dizziness.     Physical Exam Triage Vital Signs ED Triage Vitals [06/17/16 1912]  Enc Vitals Group     BP 134/68     Pulse Rate 73     Resp      Temp 98.4 F (36.9 C)     Temp Source Oral     SpO2 98 %     Weight      Height      Head Circumference      Peak Flow      Pain Score      Pain Loc      Pain Edu?      Excl. in [place]?    No data found.   Updated Vital Signs BP 134/68 (BP Location: Left Arm)   Pulse 73   Temp 98.4 F (36.9 C) (Oral)   Resp 16   SpO2 98%   Visual Acuity Right Eye Distance:   Left Eye Distance:     Bilateral Distance:    Right Eye Near:   Left Eye Near:    Bilateral Near:     Physical Exam  Constitutional: She is oriented to person, place, and time. She appears well-developed and well-nourished. No distress.  Neck: Neck supple.  Cardiovascular:  Normal rate, regular rhythm and normal heart sounds.   No murmur heard. Pulmonary/Chest: Effort normal and breath sounds normal. No respiratory distress. She has no wheezes. She has no rales. She exhibits tenderness (exquisitely tender along sternal margins; reproduces her pain).  Neurological: She is alert and oriented to person, place, and time.     UC Treatments / Results  Labs (all labs ordered are listed, but only abnormal results are displayed) Labs Reviewed - No data to display  EKG  EKG Interpretation None       Radiology No results found.  Procedures .EKG Date/Time: 06/17/2016 8:53 PM Performed by: Melony Overly Authorized by: Melony Overly   ECG reviewed by ED Physician in the absence of a cardiologist: yes   Previous ECG:    Previous ECG:  Compared to current   Comparison ECG info:  12/2012   Similarity:  Changes noted (new first degree AV block) Interpretation:    Interpretation: non-specific   Rate:    ECG rate:  77   ECG rate assessment: normal   Rhythm:    Rhythm: sinus rhythm   Ectopy:    Ectopy: none   QRS:    QRS axis:  Normal Conduction:    Conduction: abnormal     Abnormal conduction: 1st degree   ST segments:    ST segments:  Normal T waves:    T waves: normal   Comments:     NSR with first degree AV block   (including critical care time)  Medications Ordered in UC Medications - No data to display   Initial Impression / Assessment and Plan / UC Course  I have reviewed the triage vital signs and the nursing notes.  Pertinent labs & imaging results that were available during my care of the patient were reviewed by me and considered in my medical decision making (see chart for  details).  Clinical Course    Chest pain is coming from costochondritis. Treat with diclofenac and ice. Reassurance provided. Return precautions reviewed.  Final Clinical Impressions(s) / UC Diagnoses   Final diagnoses:  Costochondritis    New Prescriptions Discharge Medication List as of 06/17/2016  7:50 PM    START taking these medications   Details  diclofenac (CATAFLAM) 50 MG tablet Take 1 tablet (50 mg total) by mouth 3 (three) times daily. For 5 days then as needed for pain, Starting Fri 06/17/2016, Normal         Melony Overly, MD 06/17/16 2055

## 2016-07-20 DIAGNOSIS — N760 Acute vaginitis: Secondary | ICD-10-CM | POA: Diagnosis not present

## 2016-07-20 DIAGNOSIS — J329 Chronic sinusitis, unspecified: Secondary | ICD-10-CM | POA: Diagnosis not present

## 2016-08-01 DIAGNOSIS — H40023 Open angle with borderline findings, high risk, bilateral: Secondary | ICD-10-CM | POA: Diagnosis not present

## 2016-08-01 DIAGNOSIS — H401121 Primary open-angle glaucoma, left eye, mild stage: Secondary | ICD-10-CM | POA: Diagnosis not present

## 2016-08-02 DIAGNOSIS — E118 Type 2 diabetes mellitus with unspecified complications: Secondary | ICD-10-CM | POA: Diagnosis not present

## 2016-08-02 DIAGNOSIS — M549 Dorsalgia, unspecified: Secondary | ICD-10-CM | POA: Diagnosis not present

## 2016-08-02 DIAGNOSIS — E782 Mixed hyperlipidemia: Secondary | ICD-10-CM | POA: Diagnosis not present

## 2016-08-02 DIAGNOSIS — Z23 Encounter for immunization: Secondary | ICD-10-CM | POA: Diagnosis not present

## 2016-08-08 DIAGNOSIS — R109 Unspecified abdominal pain: Secondary | ICD-10-CM | POA: Diagnosis not present

## 2016-08-09 DIAGNOSIS — R110 Nausea: Secondary | ICD-10-CM | POA: Diagnosis not present

## 2016-08-11 ENCOUNTER — Other Ambulatory Visit: Payer: Self-pay | Admitting: Endocrinology

## 2016-08-11 DIAGNOSIS — R1011 Right upper quadrant pain: Secondary | ICD-10-CM

## 2016-08-13 ENCOUNTER — Other Ambulatory Visit: Payer: Self-pay | Admitting: Endocrinology

## 2016-08-13 DIAGNOSIS — R1011 Right upper quadrant pain: Secondary | ICD-10-CM

## 2016-08-13 DIAGNOSIS — M5489 Other dorsalgia: Secondary | ICD-10-CM

## 2016-08-15 ENCOUNTER — Other Ambulatory Visit: Payer: Self-pay | Admitting: Endocrinology

## 2016-08-15 DIAGNOSIS — M546 Pain in thoracic spine: Secondary | ICD-10-CM

## 2016-08-15 DIAGNOSIS — M5416 Radiculopathy, lumbar region: Secondary | ICD-10-CM

## 2016-08-15 DIAGNOSIS — R1011 Right upper quadrant pain: Secondary | ICD-10-CM

## 2016-08-17 ENCOUNTER — Other Ambulatory Visit: Payer: Medicare Other

## 2016-08-19 ENCOUNTER — Other Ambulatory Visit: Payer: Medicare Other

## 2016-08-26 ENCOUNTER — Other Ambulatory Visit: Payer: Medicare Other

## 2016-08-30 ENCOUNTER — Ambulatory Visit
Admission: RE | Admit: 2016-08-30 | Discharge: 2016-08-30 | Disposition: A | Discharge status: Home / Self Care | Payer: Medicare Other | Source: Ambulatory Visit | Attending: Endocrinology | Admitting: Endocrinology

## 2016-08-30 DIAGNOSIS — R1011 Right upper quadrant pain: Secondary | ICD-10-CM

## 2016-08-30 DIAGNOSIS — M546 Pain in thoracic spine: Secondary | ICD-10-CM

## 2016-08-30 DIAGNOSIS — M5416 Radiculopathy, lumbar region: Secondary | ICD-10-CM

## 2016-08-30 DIAGNOSIS — M5124 Other intervertebral disc displacement, thoracic region: Secondary | ICD-10-CM | POA: Diagnosis not present

## 2016-08-30 DIAGNOSIS — M48061 Spinal stenosis, lumbar region without neurogenic claudication: Secondary | ICD-10-CM | POA: Diagnosis not present

## 2016-09-02 DIAGNOSIS — E782 Mixed hyperlipidemia: Secondary | ICD-10-CM | POA: Diagnosis not present

## 2016-09-02 DIAGNOSIS — E118 Type 2 diabetes mellitus with unspecified complications: Secondary | ICD-10-CM | POA: Diagnosis not present

## 2016-09-02 DIAGNOSIS — Z79899 Other long term (current) drug therapy: Secondary | ICD-10-CM | POA: Diagnosis not present

## 2016-09-05 DIAGNOSIS — M5136 Other intervertebral disc degeneration, lumbar region: Secondary | ICD-10-CM | POA: Diagnosis not present

## 2016-09-05 DIAGNOSIS — E118 Type 2 diabetes mellitus with unspecified complications: Secondary | ICD-10-CM | POA: Diagnosis not present

## 2016-09-05 DIAGNOSIS — M549 Dorsalgia, unspecified: Secondary | ICD-10-CM | POA: Diagnosis not present

## 2016-09-05 DIAGNOSIS — E782 Mixed hyperlipidemia: Secondary | ICD-10-CM | POA: Diagnosis not present

## 2016-09-09 DIAGNOSIS — H00025 Hordeolum internum left lower eyelid: Secondary | ICD-10-CM | POA: Diagnosis not present

## 2016-09-14 DIAGNOSIS — R102 Pelvic and perineal pain: Secondary | ICD-10-CM | POA: Diagnosis not present

## 2016-09-20 ENCOUNTER — Ambulatory Visit (INDEPENDENT_AMBULATORY_CARE_PROVIDER_SITE_OTHER): Payer: Medicare Other | Admitting: Sports Medicine

## 2016-09-20 ENCOUNTER — Encounter: Payer: Self-pay | Admitting: Sports Medicine

## 2016-09-20 DIAGNOSIS — Q828 Other specified congenital malformations of skin: Secondary | ICD-10-CM

## 2016-09-20 DIAGNOSIS — E1149 Type 2 diabetes mellitus with other diabetic neurological complication: Secondary | ICD-10-CM

## 2016-09-20 NOTE — Progress Notes (Signed)
Patient ID: Bonnie [name], female   DOB: October 02, 1956, 60 y.o.   MRN: [id number] Subjective: Bonnie [name] is a 60 y.o. female patient with history of diabetes who presents to office today complaining of painful callus while ambulating in shoes; unable to trim. Patient states that the glucose reading this morning was 119 mg/dl. Patient denies any new changes in medication or new problems. Patient denies any new cramping, numbness, burning or tingling in the legs has burning sensation to toes unchanged from prior. No other issues.  Patient Active Problem List   Diagnosis Date Noted  . Cervical pain 03/07/2016  . Sickle cell trait ([place]) 03/07/2016  . Carpal tunnel syndrome 12/07/2015  . Pain in female genitalia on intercourse 09/17/2014  . Nausea with vomiting 04/22/2014  . Diarrhea 04/22/2014  . Essential hypertension, benign 04/20/2014  . Ileitis 04/19/2014  . Cataract 12/25/2013  . Dry eye syndrome 12/25/2013  . Chronic LBP 02/27/2013  . Hernia, rectovaginal 02/12/2013  . Dysfunctional voiding of urine 02/12/2013  . Bladder cystocele 12/06/2012  . Diabetes mellitus ([place]) 12/06/2012  . HLD (hyperlipidemia) 12/06/2012  . Benign neoplasm of meninges ([place]) 12/06/2012  . Mixed incontinence 12/06/2012  . Open angle with borderline findings, high risk 10/17/2011  . Chest pain, atypical 10/01/2011  . Shortness of breath 10/01/2011  . DM type 2 (diabetes mellitus, type 2) ([place]) 10/01/2011  . Hypercholesteremia   . Anemia   . Obesity   . Anxiety   . GERD (gastroesophageal reflux disease)    Current Outpatient Prescriptions on File Prior to Visit  Medication Sig Dispense Refill  . carboxymethylcellulose (REFRESH TEARS) 0.5 % SOLN 1 drop See admin instructions. Instill 1 drop in both eyes every morning and 1 drop in right eye every night at bedtime    . carboxymethylcellulose (REFRESH) 1 % ophthalmic solution Place 1 drop into the left eye at bedtime.    . clonazePAM (KLONOPIN) 0.5 MG tablet Take  1 tablet (0.5 mg total) by mouth at bedtime as needed (insomnia). 20 tablet 0  . Dapagliflozin Propanediol (FARXIGA) 10 MG TABS Take 5 mg by mouth daily.    Marland Kitchen dexlansoprazole (DEXILANT) 60 MG capsule Take 60 mg by mouth daily.     . diclofenac (CATAFLAM) 50 MG tablet Take 1 tablet (50 mg total) by mouth 3 (three) times daily. For 5 days then as needed for pain 30 tablet 0  . HYDROcodone-acetaminophen (NORCO) 5-325 MG per tablet Take 0.5-2 tablets by mouth 3 (three) times daily as needed. 1/2 tablet for mild pain, 1 tablet for moderate pain, 2 tablets for severe pain    . Liraglutide (VICTOZA) 18 MG/3ML SOPN Inject 7.2 mg into the skin daily. 1.2 mls    . metFORMIN (GLUCOPHAGE) 1000 MG tablet Take 1,000 mg by mouth 2 (two) times daily with a meal.     . ramipril (ALTACE) 5 MG capsule Take 5 mg by mouth daily.    . simvastatin (ZOCOR) 40 MG tablet Take 20 mg by mouth daily at 6 PM.     . Urea 39 % CREA Apply 1 application topically daily. 1 Bottle 2  . valACYclovir (VALTREX) 1000 MG tablet Take 500 mg by mouth daily. For outbreak    . vitamin C (ASCORBIC ACID) 500 MG tablet Take 500 mg by mouth daily.     No current facility-administered medications on file prior to visit.    Allergies  Allergen Reactions  . Biaxin [Clarithromycin] Swelling and Other (See Comments)  Throat swelling Other Reaction: laryngeal edema Throat swelling   . Exenatide Rash and Other (See Comments)    Caused Sweating, sores in the mouth  Also gum soreness/irritation  . Ampicillin Nausea And Vomiting  . Carafate [Sucralfate] Nausea And Vomiting  . Metronidazole Nausea And Vomiting and Nausea Only    No results found for this or any previous visit (from the past 2160 hour(s)).  Objective: General: Patient is awake, alert, and oriented x 3 and in no acute distress.  Integument: Skin is warm, dry and supple bilateral. Nails are short, thickened and dystrophic with subungual debris, consistent with onychomycosis,  1-5 bilateral. No signs of infection. No open lesions. + callus with nucleated core consistent with porokeratosis present sub met 1,3,5 bilateral and right medial hallux with no signs of infection.  Dry skin to left heel. Remaining integument unremarkable.  Vasculature:  Dorsalis Pedis pulse 2/4 bilateral. Posterior Tibial pulse  1/4 bilateral.  Capillary fill time <3 sec 1-5 bilateral. Scant hair growth to the level of the digits. Temperature gradient within normal limits. No varicosities present bilateral. No edema present bilateral.   Neurology: The patient has intact sensation measured with a 5.07/10g Semmes Weinstein Monofilament at all pedal sites bilateral . Vibratory sensation diminished bilateral with tuning fork. No Babinski sign present bilateral.   Musculoskeletal: Mild hammertoes and fat pad atrophy/prominent met(s) deformities noted bilateral. Muscular strength 5/5 in all lower extremity muscular groups bilateral without pain on range of motion . No tenderness with calf compression bilateral.  Assessment and Plan: Problem List Items Addressed This Visit    None    Visit Diagnoses    Porokeratosis    -  Primary   Diabetes mellitus type 2 with neurological manifestations ([place])          -Examined patient. -Discussed and educated patient on diabetic foot care, especially with  regards to the vascular, neurological and musculoskeletal systems.  -Stressed the importance of good glycemic control and the detriment of not  controlling glucose levels in relation to the foot. -Mechanically debrided callus x 7 using sterile chisel blade without incident  -Nails short this visit; patient peeled them off; advised patient to refrain from doing so and allow to grow out  -Recommend cont with daily skin emollients; okeeffe's healthy feet and vit E oil  -Vinegar soaks as needed to help with nail thickness and discoloration to nails -Cont with Klonopin for nerve pain and recommended OTC  capsciacin cream or follow up with neurologist -Recommend good supportive shoes daily Safe step diabetic shoe order form was completed; office to contact primary care for approval / certification;  Office to arrange shoe fitting and dispensing. -Answered all patient questions -Patient to return in 3 months for at risk foot care -Patient advised to call the office if any problems or questions arise in the meantime.  Landis Martins, DPM

## 2016-09-26 DIAGNOSIS — N952 Postmenopausal atrophic vaginitis: Secondary | ICD-10-CM | POA: Diagnosis not present

## 2016-10-03 DIAGNOSIS — E118 Type 2 diabetes mellitus with unspecified complications: Secondary | ICD-10-CM | POA: Diagnosis not present

## 2016-10-03 DIAGNOSIS — M5136 Other intervertebral disc degeneration, lumbar region: Secondary | ICD-10-CM | POA: Diagnosis not present

## 2016-10-03 DIAGNOSIS — J060 Acute laryngopharyngitis: Secondary | ICD-10-CM | POA: Diagnosis not present

## 2016-10-03 DIAGNOSIS — H00015 Hordeolum externum left lower eyelid: Secondary | ICD-10-CM | POA: Diagnosis not present

## 2016-11-03 DIAGNOSIS — M5412 Radiculopathy, cervical region: Secondary | ICD-10-CM | POA: Diagnosis not present

## 2016-11-03 DIAGNOSIS — M7502 Adhesive capsulitis of left shoulder: Secondary | ICD-10-CM | POA: Diagnosis not present

## 2016-11-23 ENCOUNTER — Other Ambulatory Visit: Payer: Self-pay

## 2016-11-23 DIAGNOSIS — M24831 Other specific joint derangements of right wrist, not elsewhere classified: Secondary | ICD-10-CM | POA: Diagnosis not present

## 2016-11-23 DIAGNOSIS — M67431 Ganglion, right wrist: Secondary | ICD-10-CM | POA: Diagnosis not present

## 2016-11-23 DIAGNOSIS — M24131 Other articular cartilage disorders, right wrist: Secondary | ICD-10-CM | POA: Diagnosis not present

## 2016-11-23 DIAGNOSIS — S63591A Other specified sprain of right wrist, initial encounter: Secondary | ICD-10-CM | POA: Diagnosis not present

## 2016-11-23 DIAGNOSIS — M659 Synovitis and tenosynovitis, unspecified: Secondary | ICD-10-CM | POA: Diagnosis not present

## 2016-11-23 DIAGNOSIS — M67432 Ganglion, left wrist: Secondary | ICD-10-CM | POA: Diagnosis not present

## 2016-12-05 DIAGNOSIS — E119 Type 2 diabetes mellitus without complications: Secondary | ICD-10-CM | POA: Diagnosis not present

## 2016-12-05 DIAGNOSIS — H401131 Primary open-angle glaucoma, bilateral, mild stage: Secondary | ICD-10-CM | POA: Diagnosis not present

## 2016-12-05 DIAGNOSIS — H2513 Age-related nuclear cataract, bilateral: Secondary | ICD-10-CM | POA: Diagnosis not present

## 2016-12-06 DIAGNOSIS — S6981XD Other specified injuries of right wrist, hand and finger(s), subsequent encounter: Secondary | ICD-10-CM | POA: Diagnosis not present

## 2016-12-12 DIAGNOSIS — S6981XD Other specified injuries of right wrist, hand and finger(s), subsequent encounter: Secondary | ICD-10-CM | POA: Diagnosis not present

## 2016-12-16 DIAGNOSIS — E118 Type 2 diabetes mellitus with unspecified complications: Secondary | ICD-10-CM | POA: Diagnosis not present

## 2016-12-20 ENCOUNTER — Ambulatory Visit: Payer: Medicare Other | Admitting: Sports Medicine

## 2016-12-26 DIAGNOSIS — M150 Primary generalized (osteo)arthritis: Secondary | ICD-10-CM | POA: Diagnosis not present

## 2016-12-26 DIAGNOSIS — E782 Mixed hyperlipidemia: Secondary | ICD-10-CM | POA: Diagnosis not present

## 2016-12-26 DIAGNOSIS — E1165 Type 2 diabetes mellitus with hyperglycemia: Secondary | ICD-10-CM | POA: Diagnosis not present

## 2017-01-03 DIAGNOSIS — M67431 Ganglion, right wrist: Secondary | ICD-10-CM | POA: Diagnosis not present

## 2017-01-06 DIAGNOSIS — H401131 Primary open-angle glaucoma, bilateral, mild stage: Secondary | ICD-10-CM | POA: Diagnosis not present

## 2017-01-18 DIAGNOSIS — H0289 Other specified disorders of eyelid: Secondary | ICD-10-CM | POA: Diagnosis not present

## 2017-01-18 DIAGNOSIS — H2513 Age-related nuclear cataract, bilateral: Secondary | ICD-10-CM | POA: Diagnosis not present

## 2017-01-23 ENCOUNTER — Ambulatory Visit (INDEPENDENT_AMBULATORY_CARE_PROVIDER_SITE_OTHER): Payer: Medicare Other | Admitting: Podiatry

## 2017-01-23 ENCOUNTER — Encounter: Payer: Self-pay | Admitting: Podiatry

## 2017-01-23 VITALS — BP 125/69 | HR 83 | Resp 16

## 2017-01-23 DIAGNOSIS — Q828 Other specified congenital malformations of skin: Secondary | ICD-10-CM

## 2017-01-23 DIAGNOSIS — E1149 Type 2 diabetes mellitus with other diabetic neurological complication: Secondary | ICD-10-CM | POA: Diagnosis not present

## 2017-01-23 DIAGNOSIS — M2040 Other hammer toe(s) (acquired), unspecified foot: Secondary | ICD-10-CM

## 2017-01-23 NOTE — Progress Notes (Signed)
Subjective:     Patient ID: Bonnie [name], female   DOB: [birth year], 61 y.o.   MRN: [id number]  HPI patient presents with multiple lesions underneath both feet the become painful and Er[place]-term diabetes with digital deformity   Review of Systems     Objective:   Physical Exam Neurovascular status diminished but unchanged from previous visit with patient found to have severe lesions bilateral approximate 4 on each side along with digital deformities digits 5 bilateral and Perren-term history of diabetes    Assessment:     Mycotic nail infection keratotic lesion formation with porokeratotic quarters diabetic neuropathic symptomatology and digital deformities    Plan:     H&P all conditions discussed and debridement of all lesions accomplished. No iatrogenic bleeding was noted and I did discuss [place]-term nature of diabetes with chronic element and Anastacio-term I do believe she would be best with diabetic shoes and she was seen by our pad orthotist 2 agreed and she is scheduled for diabetic shoe application

## 2017-01-25 DIAGNOSIS — E782 Mixed hyperlipidemia: Secondary | ICD-10-CM | POA: Diagnosis not present

## 2017-01-25 DIAGNOSIS — E1165 Type 2 diabetes mellitus with hyperglycemia: Secondary | ICD-10-CM | POA: Diagnosis not present

## 2017-02-09 DIAGNOSIS — S39012A Strain of muscle, fascia and tendon of lower back, initial encounter: Secondary | ICD-10-CM | POA: Diagnosis not present

## 2017-02-09 DIAGNOSIS — M549 Dorsalgia, unspecified: Secondary | ICD-10-CM | POA: Diagnosis not present

## 2017-02-09 DIAGNOSIS — M545 Low back pain: Secondary | ICD-10-CM | POA: Diagnosis not present

## 2017-02-27 ENCOUNTER — Other Ambulatory Visit: Payer: Medicare Other

## 2017-02-27 DIAGNOSIS — E1140 Type 2 diabetes mellitus with diabetic neuropathy, unspecified: Secondary | ICD-10-CM | POA: Diagnosis not present

## 2017-02-27 DIAGNOSIS — M2040 Other hammer toe(s) (acquired), unspecified foot: Secondary | ICD-10-CM | POA: Diagnosis not present

## 2017-02-27 DIAGNOSIS — L84 Corns and callosities: Secondary | ICD-10-CM

## 2017-03-14 ENCOUNTER — Other Ambulatory Visit: Payer: Self-pay | Admitting: Gastroenterology

## 2017-03-14 DIAGNOSIS — R142 Eructation: Secondary | ICD-10-CM | POA: Diagnosis not present

## 2017-03-14 DIAGNOSIS — R1310 Dysphagia, unspecified: Secondary | ICD-10-CM | POA: Diagnosis not present

## 2017-03-14 DIAGNOSIS — R066 Hiccough: Secondary | ICD-10-CM | POA: Diagnosis not present

## 2017-03-14 DIAGNOSIS — K219 Gastro-esophageal reflux disease without esophagitis: Secondary | ICD-10-CM | POA: Diagnosis not present

## 2017-03-16 DIAGNOSIS — H2513 Age-related nuclear cataract, bilateral: Secondary | ICD-10-CM | POA: Diagnosis not present

## 2017-03-16 DIAGNOSIS — H2511 Age-related nuclear cataract, right eye: Secondary | ICD-10-CM | POA: Diagnosis not present

## 2017-03-21 ENCOUNTER — Ambulatory Visit
Admission: RE | Admit: 2017-03-21 | Discharge: 2017-03-21 | Disposition: A | Discharge status: Home / Self Care | Payer: Medicare Other | Source: Ambulatory Visit | Attending: Gastroenterology | Admitting: Gastroenterology

## 2017-03-21 DIAGNOSIS — R1310 Dysphagia, unspecified: Secondary | ICD-10-CM

## 2017-03-21 DIAGNOSIS — K224 Dyskinesia of esophagus: Secondary | ICD-10-CM | POA: Diagnosis not present

## 2017-03-23 DIAGNOSIS — H25811 Combined forms of age-related cataract, right eye: Secondary | ICD-10-CM | POA: Diagnosis not present

## 2017-03-23 DIAGNOSIS — Z7984 Long term (current) use of oral hypoglycemic drugs: Secondary | ICD-10-CM | POA: Diagnosis not present

## 2017-03-23 DIAGNOSIS — E119 Type 2 diabetes mellitus without complications: Secondary | ICD-10-CM | POA: Diagnosis not present

## 2017-03-23 DIAGNOSIS — H25813 Combined forms of age-related cataract, bilateral: Secondary | ICD-10-CM | POA: Diagnosis not present

## 2017-04-06 DIAGNOSIS — H2512 Age-related nuclear cataract, left eye: Secondary | ICD-10-CM | POA: Diagnosis not present

## 2017-04-06 DIAGNOSIS — H25812 Combined forms of age-related cataract, left eye: Secondary | ICD-10-CM | POA: Diagnosis not present

## 2017-04-12 DIAGNOSIS — Z79899 Other long term (current) drug therapy: Secondary | ICD-10-CM | POA: Diagnosis not present

## 2017-04-12 DIAGNOSIS — M5136 Other intervertebral disc degeneration, lumbar region: Secondary | ICD-10-CM | POA: Diagnosis not present

## 2017-04-12 DIAGNOSIS — E782 Mixed hyperlipidemia: Secondary | ICD-10-CM | POA: Diagnosis not present

## 2017-04-12 DIAGNOSIS — E1165 Type 2 diabetes mellitus with hyperglycemia: Secondary | ICD-10-CM | POA: Diagnosis not present

## 2017-04-16 DIAGNOSIS — H01001 Unspecified blepharitis right upper eyelid: Secondary | ICD-10-CM | POA: Diagnosis not present

## 2017-04-16 DIAGNOSIS — H5711 Ocular pain, right eye: Secondary | ICD-10-CM | POA: Diagnosis not present

## 2017-05-04 DIAGNOSIS — E113293 Type 2 diabetes mellitus with mild nonproliferative diabetic retinopathy without macular edema, bilateral: Secondary | ICD-10-CM | POA: Diagnosis not present

## 2017-05-04 DIAGNOSIS — Z961 Presence of intraocular lens: Secondary | ICD-10-CM | POA: Insufficient documentation

## 2017-05-04 DIAGNOSIS — H59031 Cystoid macular edema following cataract surgery, right eye: Secondary | ICD-10-CM | POA: Insufficient documentation

## 2017-05-05 DIAGNOSIS — L309 Dermatitis, unspecified: Secondary | ICD-10-CM | POA: Diagnosis not present

## 2017-06-05 DIAGNOSIS — E1165 Type 2 diabetes mellitus with hyperglycemia: Secondary | ICD-10-CM | POA: Diagnosis not present

## 2017-06-05 DIAGNOSIS — Z5181 Encounter for therapeutic drug level monitoring: Secondary | ICD-10-CM | POA: Diagnosis not present

## 2017-06-05 DIAGNOSIS — E782 Mixed hyperlipidemia: Secondary | ICD-10-CM | POA: Diagnosis not present

## 2017-06-12 DIAGNOSIS — M150 Primary generalized (osteo)arthritis: Secondary | ICD-10-CM | POA: Diagnosis not present

## 2017-06-12 DIAGNOSIS — E1165 Type 2 diabetes mellitus with hyperglycemia: Secondary | ICD-10-CM | POA: Diagnosis not present

## 2017-06-12 DIAGNOSIS — Z5181 Encounter for therapeutic drug level monitoring: Secondary | ICD-10-CM | POA: Diagnosis not present

## 2017-06-12 DIAGNOSIS — E782 Mixed hyperlipidemia: Secondary | ICD-10-CM | POA: Diagnosis not present

## 2017-06-29 DIAGNOSIS — G5602 Carpal tunnel syndrome, left upper limb: Secondary | ICD-10-CM | POA: Diagnosis not present

## 2017-07-15 ENCOUNTER — Telehealth: Payer: Self-pay | Admitting: Sports Medicine

## 2017-07-15 NOTE — Telephone Encounter (Signed)
Patient called stating that she is diabetic and her feet hurt. States she thought it was her neuropathy but when she felt the bottom of her feet she realized it was hard callus. I asked the patient if there was any bleeding, warmth, or swelling and she stated "No". I informed patient that she can call office after the holiday for a appointment for routine foot care; patient expressed understanding and stated that she will call for an appointment. -Dr. Chauncey Cruel

## 2017-07-21 ENCOUNTER — Ambulatory Visit (INDEPENDENT_AMBULATORY_CARE_PROVIDER_SITE_OTHER): Payer: Medicare Other | Admitting: Podiatry

## 2017-07-21 ENCOUNTER — Encounter: Payer: Self-pay | Admitting: Podiatry

## 2017-07-21 DIAGNOSIS — L84 Corns and callosities: Secondary | ICD-10-CM | POA: Diagnosis not present

## 2017-07-21 DIAGNOSIS — E1151 Type 2 diabetes mellitus with diabetic peripheral angiopathy without gangrene: Secondary | ICD-10-CM

## 2017-07-27 NOTE — Progress Notes (Signed)
   Subjective:    Patient ID: Bonnie [name], female    DOB: 1956-09-28, 61 y.o.   MRN: [id number]   HPI Chief Complaint  Patient presents with  . Callouses    Right foot; plantar forefoot-below great toe  . Nail Problem    Right foot; great toe-medial side; pt stated, "Feels hardness in corner of nail"  . Painful lesion    Bilateral; plantar forefeoot-below 5th toe    61 y.o. female returns for the above complaints. Diabetic with good control. Reports painful calluses bilat and painful R great toenail pain. Denies other issues.  Review of Systems     Objective:   Physical Exam There were no vitals filed for this visit. General AA&O x3. Normal mood and affect.  Vascular Dorsalis pedis and posterior tibial pulses  1+ and non-palp bilaterally  Capillary refill delayed ~ 5 seconds to all digits. Pedal hair growth absent.  Neurologic Epicritic sensation present bilaterally. Protective sensation with 5.07 monofilament  present bilaterally. Vibratory sensation present bilaterally.  Dermatologic No open lesions. Interspaces clear of maceration.  Normal skin temperature and turgor. Hyperkeratotic lesions: submet 1,3, 5 bilaterally. Nails: brittle, thickening. R hallux nail medial border hyperkatotic.  Orthopedic: No history of amputation. MMT 5/5 in dorsiflexion, plantarflexion, inversion, and eversion. Normal lower extremity joint ROM without pain or crepitus.     Assessment & Plan:  Diabetes with PAD, Painful Calluses -Educated on diabetic footcare. Diabetic risk level 2 -Lesions debrided as below.  Procedure: Paring of Lesion Rationale: painful hyperkeratotic lesion; qualifies for routine foot care due to Class B findings. Type of Debridement: manual, sharp debridement. Instrumentation: 312 blade Number of Lesions: 6

## 2017-08-18 DIAGNOSIS — H26492 Other secondary cataract, left eye: Secondary | ICD-10-CM | POA: Diagnosis not present

## 2017-09-01 DIAGNOSIS — D225 Melanocytic nevi of trunk: Secondary | ICD-10-CM | POA: Diagnosis not present

## 2017-09-01 DIAGNOSIS — L821 Other seborrheic keratosis: Secondary | ICD-10-CM | POA: Diagnosis not present

## 2017-09-01 DIAGNOSIS — L818 Other specified disorders of pigmentation: Secondary | ICD-10-CM | POA: Diagnosis not present

## 2017-09-11 DIAGNOSIS — G5602 Carpal tunnel syndrome, left upper limb: Secondary | ICD-10-CM | POA: Diagnosis not present

## 2017-09-20 DIAGNOSIS — H26491 Other secondary cataract, right eye: Secondary | ICD-10-CM | POA: Diagnosis not present

## 2017-09-22 DIAGNOSIS — G5602 Carpal tunnel syndrome, left upper limb: Secondary | ICD-10-CM | POA: Diagnosis not present

## 2017-09-22 DIAGNOSIS — G5601 Carpal tunnel syndrome, right upper limb: Secondary | ICD-10-CM | POA: Diagnosis not present

## 2017-09-25 DIAGNOSIS — Z23 Encounter for immunization: Secondary | ICD-10-CM | POA: Diagnosis not present

## 2017-09-25 DIAGNOSIS — Z780 Asymptomatic menopausal state: Secondary | ICD-10-CM | POA: Diagnosis not present

## 2017-09-25 DIAGNOSIS — E1165 Type 2 diabetes mellitus with hyperglycemia: Secondary | ICD-10-CM | POA: Diagnosis not present

## 2017-09-25 DIAGNOSIS — E782 Mixed hyperlipidemia: Secondary | ICD-10-CM | POA: Diagnosis not present

## 2017-09-25 DIAGNOSIS — Z0000 Encounter for general adult medical examination without abnormal findings: Secondary | ICD-10-CM | POA: Diagnosis not present

## 2017-09-25 DIAGNOSIS — N390 Urinary tract infection, site not specified: Secondary | ICD-10-CM | POA: Diagnosis not present

## 2017-10-03 DIAGNOSIS — E1165 Type 2 diabetes mellitus with hyperglycemia: Secondary | ICD-10-CM | POA: Diagnosis not present

## 2017-10-03 DIAGNOSIS — R002 Palpitations: Secondary | ICD-10-CM | POA: Diagnosis not present

## 2017-10-03 DIAGNOSIS — E782 Mixed hyperlipidemia: Secondary | ICD-10-CM | POA: Diagnosis not present

## 2017-10-03 DIAGNOSIS — E1121 Type 2 diabetes mellitus with diabetic nephropathy: Secondary | ICD-10-CM | POA: Diagnosis not present

## 2017-10-03 DIAGNOSIS — I10 Essential (primary) hypertension: Secondary | ICD-10-CM | POA: Diagnosis not present

## 2017-10-11 ENCOUNTER — Ambulatory Visit: Payer: Medicare Other | Admitting: Neurology

## 2017-10-15 ENCOUNTER — Encounter (HOSPITAL_COMMUNITY): Payer: Self-pay | Admitting: Emergency Medicine

## 2017-10-15 ENCOUNTER — Emergency Department (HOSPITAL_COMMUNITY): Payer: Medicare Other

## 2017-10-15 ENCOUNTER — Ambulatory Visit (HOSPITAL_COMMUNITY)
Admission: EM | Admit: 2017-10-15 | Discharge: 2017-10-15 | Disposition: A | Discharge status: Home / Self Care | Payer: Medicare Other

## 2017-10-15 ENCOUNTER — Other Ambulatory Visit: Payer: Self-pay

## 2017-10-15 ENCOUNTER — Encounter (HOSPITAL_COMMUNITY): Payer: Self-pay | Admitting: *Deleted

## 2017-10-15 DIAGNOSIS — Z794 Long term (current) use of insulin: Secondary | ICD-10-CM | POA: Insufficient documentation

## 2017-10-15 DIAGNOSIS — S39012A Strain of muscle, fascia and tendon of lower back, initial encounter: Secondary | ICD-10-CM

## 2017-10-15 DIAGNOSIS — Z87891 Personal history of nicotine dependence: Secondary | ICD-10-CM | POA: Diagnosis not present

## 2017-10-15 DIAGNOSIS — S199XXA Unspecified injury of neck, initial encounter: Secondary | ICD-10-CM | POA: Insufficient documentation

## 2017-10-15 DIAGNOSIS — Y999 Unspecified external cause status: Secondary | ICD-10-CM | POA: Diagnosis not present

## 2017-10-15 DIAGNOSIS — E119 Type 2 diabetes mellitus without complications: Secondary | ICD-10-CM | POA: Diagnosis not present

## 2017-10-15 DIAGNOSIS — Y939 Activity, unspecified: Secondary | ICD-10-CM | POA: Diagnosis not present

## 2017-10-15 DIAGNOSIS — R109 Unspecified abdominal pain: Secondary | ICD-10-CM | POA: Diagnosis not present

## 2017-10-15 DIAGNOSIS — I10 Essential (primary) hypertension: Secondary | ICD-10-CM | POA: Diagnosis not present

## 2017-10-15 DIAGNOSIS — Y92410 Unspecified street and highway as the place of occurrence of the external cause: Secondary | ICD-10-CM | POA: Insufficient documentation

## 2017-10-15 DIAGNOSIS — S161XXA Strain of muscle, fascia and tendon at neck level, initial encounter: Secondary | ICD-10-CM | POA: Diagnosis not present

## 2017-10-15 DIAGNOSIS — M542 Cervicalgia: Secondary | ICD-10-CM | POA: Diagnosis not present

## 2017-10-15 DIAGNOSIS — Z79899 Other long term (current) drug therapy: Secondary | ICD-10-CM | POA: Diagnosis not present

## 2017-10-15 LAB — COMPREHENSIVE METABOLIC PANEL
ALBUMIN: 3.8 g/dL (ref 3.5–5.0)
ALK PHOS: 111 U/L (ref 38–126)
ALT: 12 U/L — ABNORMAL LOW (ref 14–54)
ANION GAP: 9 (ref 5–15)
AST: 23 U/L (ref 15–41)
BUN: 9 mg/dL (ref 6–20)
CALCIUM: 9.6 mg/dL (ref 8.9–10.3)
CHLORIDE: 106 mmol/L (ref 101–111)
CO2: 24 mmol/L (ref 22–32)
Creatinine, Ser: 0.83 mg/dL (ref 0.44–1.00)
GFR calc non Af Amer: >60 mL/min (ref >60)
GLUCOSE: 168 mg/dL — AB (ref 65–99)
POTASSIUM: 3.6 mmol/L (ref 3.5–5.1)
SODIUM: 139 mmol/L (ref 135–145)
Total Bilirubin: 0.2 mg/dL — ABNORMAL LOW (ref 0.3–1.2)
Total Protein: 7.8 g/dL (ref 6.5–8.1)

## 2017-10-15 LAB — URINALYSIS, ROUTINE W REFLEX MICROSCOPIC
Bacteria, UA: NONE SEEN
Bilirubin Urine: NEGATIVE
HGB URINE DIPSTICK: NEGATIVE
Ketones, ur: NEGATIVE mg/dL
Leukocytes, UA: NEGATIVE
Nitrite: NEGATIVE
PROTEIN: NEGATIVE mg/dL
Specific Gravity, Urine: 1.016 (ref 1.005–1.030)
pH: 5 (ref 5.0–8.0)

## 2017-10-15 LAB — CBC
HEMATOCRIT: 37.6 % (ref 36.0–46.0)
HEMOGLOBIN: 12.2 g/dL (ref 12.0–15.0)
MCH: 23.8 pg — AB (ref 26.0–34.0)
MCHC: 32.4 g/dL (ref 30.0–36.0)
MCV: 73.4 fL — AB (ref 78.0–100.0)
Platelets: 233 10*3/uL (ref 150–400)
RBC: 5.12 MIL/uL — AB (ref 3.87–5.11)
RDW: 16.9 % — ABNORMAL HIGH (ref 11.5–15.5)
WBC: 8 10*3/uL (ref 4.0–10.5)

## 2017-10-15 LAB — LIPASE, BLOOD: LIPASE: 40 U/L (ref 11–51)

## 2017-10-15 MED ORDER — IOPAMIDOL (ISOVUE-300) INJECTION 61%
INTRAVENOUS | Status: AC
  Administered 2017-10-15: 100 mL
  Filled 2017-10-15: qty 100
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
## 2017-10-16 ENCOUNTER — Emergency Department (HOSPITAL_COMMUNITY)
Admission: EM | Admit: 2017-10-16 | Discharge: 2017-10-16 | Disposition: A | Discharge status: Home / Self Care | Payer: Medicare Other | Attending: Emergency Medicine | Admitting: Emergency Medicine

## 2017-10-16 DIAGNOSIS — S161XXA Strain of muscle, fascia and tendon at neck level, initial encounter: Secondary | ICD-10-CM

## 2017-10-16 DIAGNOSIS — R109 Unspecified abdominal pain: Secondary | ICD-10-CM

## 2017-10-16 MED ORDER — NAPROXEN 500 MG PO TABS: 500.0000 mg | ORAL_TABLET | Freq: Two times a day (BID) | ORAL | 0 refills | Status: DC

## 2017-10-16 MED ORDER — CYCLOBENZAPRINE HCL 5 MG PO TABS: 5.0000 mg | ORAL_TABLET | Freq: Two times a day (BID) | ORAL | 0 refills | Status: DC | PRN
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
## 2017-10-16 NOTE — ED Notes (Signed)
See EDP assessment.  Pt brought to [facility] room for Dr. Dina Rich to disposition.

## 2017-10-17 DIAGNOSIS — G8929 Other chronic pain: Secondary | ICD-10-CM | POA: Diagnosis not present

## 2017-10-17 DIAGNOSIS — M545 Low back pain: Secondary | ICD-10-CM | POA: Diagnosis not present

## 2017-10-26 ENCOUNTER — Other Ambulatory Visit: Payer: Self-pay | Admitting: Orthopedic Surgery

## 2017-10-26 DIAGNOSIS — G8929 Other chronic pain: Secondary | ICD-10-CM

## 2017-10-26 DIAGNOSIS — M545 Low back pain, unspecified: Secondary | ICD-10-CM

## 2017-11-03 ENCOUNTER — Ambulatory Visit
Admission: RE | Admit: 2017-11-03 | Discharge: 2017-11-03 | Disposition: A | Discharge status: Home / Self Care | Payer: Medicare Other | Source: Ambulatory Visit | Attending: Orthopedic Surgery | Admitting: Orthopedic Surgery

## 2017-11-03 DIAGNOSIS — M545 Low back pain, unspecified: Secondary | ICD-10-CM

## 2017-11-03 DIAGNOSIS — G8929 Other chronic pain: Secondary | ICD-10-CM

## 2017-11-03 MED ORDER — METHYLPREDNISOLONE ACETATE 40 MG/ML INJ SUSP (RADIOLOG: 120.0000 mg | Freq: Once | INTRAMUSCULAR | Status: DC

## 2017-11-15 DIAGNOSIS — E782 Mixed hyperlipidemia: Secondary | ICD-10-CM | POA: Diagnosis not present

## 2017-11-15 DIAGNOSIS — E1165 Type 2 diabetes mellitus with hyperglycemia: Secondary | ICD-10-CM | POA: Diagnosis not present

## 2017-11-17 DIAGNOSIS — M545 Low back pain: Secondary | ICD-10-CM | POA: Diagnosis not present

## 2017-11-20 DIAGNOSIS — G5602 Carpal tunnel syndrome, left upper limb: Secondary | ICD-10-CM | POA: Diagnosis not present

## 2017-11-23 DIAGNOSIS — Z4789 Encounter for other orthopedic aftercare: Secondary | ICD-10-CM | POA: Insufficient documentation

## 2017-11-30 ENCOUNTER — Emergency Department (HOSPITAL_BASED_OUTPATIENT_CLINIC_OR_DEPARTMENT_OTHER): Payer: Medicare Other

## 2017-11-30 ENCOUNTER — Emergency Department (HOSPITAL_BASED_OUTPATIENT_CLINIC_OR_DEPARTMENT_OTHER)
Admission: EM | Admit: 2017-11-30 | Discharge: 2017-11-30 | Disposition: A | Discharge status: Home / Self Care | Payer: Medicare Other | Attending: Emergency Medicine | Admitting: Emergency Medicine

## 2017-11-30 ENCOUNTER — Other Ambulatory Visit: Payer: Self-pay

## 2017-11-30 ENCOUNTER — Encounter (HOSPITAL_BASED_OUTPATIENT_CLINIC_OR_DEPARTMENT_OTHER): Payer: Self-pay

## 2017-11-30 DIAGNOSIS — R0602 Shortness of breath: Secondary | ICD-10-CM

## 2017-11-30 DIAGNOSIS — Z794 Long term (current) use of insulin: Secondary | ICD-10-CM | POA: Insufficient documentation

## 2017-11-30 DIAGNOSIS — Z87891 Personal history of nicotine dependence: Secondary | ICD-10-CM | POA: Insufficient documentation

## 2017-11-30 DIAGNOSIS — Z79899 Other long term (current) drug therapy: Secondary | ICD-10-CM | POA: Diagnosis not present

## 2017-11-30 DIAGNOSIS — R079 Chest pain, unspecified: Secondary | ICD-10-CM | POA: Diagnosis not present

## 2017-11-30 DIAGNOSIS — E119 Type 2 diabetes mellitus without complications: Secondary | ICD-10-CM | POA: Insufficient documentation

## 2017-11-30 LAB — CBC
HEMATOCRIT: 34.9 % — AB (ref 36.0–46.0)
HEMOGLOBIN: 11.4 g/dL — AB (ref 12.0–15.0)
MCH: 23.5 pg — AB (ref 26.0–34.0)
MCHC: 32.7 g/dL (ref 30.0–36.0)
MCV: 71.8 fL — AB (ref 78.0–100.0)
Platelets: 218 10*3/uL (ref 150–400)
RBC: 4.86 MIL/uL (ref 3.87–5.11)
RDW: 17.1 % — ABNORMAL HIGH (ref 11.5–15.5)
WBC: 8.3 10*3/uL (ref 4.0–10.5)

## 2017-11-30 LAB — BASIC METABOLIC PANEL
ANION GAP: 7 (ref 5–15)
BUN: 17 mg/dL (ref 6–20)
CO2: 26 mmol/L (ref 22–32)
Calcium: 9.1 mg/dL (ref 8.9–10.3)
Chloride: 103 mmol/L (ref 101–111)
Creatinine, Ser: 0.79 mg/dL (ref 0.44–1.00)
GFR calc non Af Amer: >60 mL/min (ref >60)
Glucose, Bld: 90 mg/dL (ref 65–99)
POTASSIUM: 3.9 mmol/L (ref 3.5–5.1)
Sodium: 136 mmol/L (ref 135–145)

## 2017-11-30 LAB — TROPONIN I: Troponin I: <0.03 ng/mL (ref <0.03)

## 2017-11-30 LAB — D-DIMER, QUANTITATIVE: D-Dimer, Quant: 0.58 ug/mL-FEU — ABNORMAL HIGH (ref 0.00–0.50)

## 2017-11-30 NOTE — ED Notes (Signed)
Patient transported to X-ray 

## 2017-11-30 NOTE — ED Triage Notes (Signed)
SOB and lower chest pain around rib cage that occurs when she lies flat, started yesterday, somewhat relieved with two tylenol and oral fluids, pt speaking in complete sentences without issue

## 2017-11-30 NOTE — ED Provider Notes (Signed)
TIME SEEN: 1:28 AM  CHIEF COMPLAINT: Shortness of breath with lying flat  HPI: Patient is a 62 year old female with history of diabetes, hyperlipidemia who presents to the emergency department with complaints of shortness of breath.  Symptoms only present when she is lying flat.  She does have some discomfort in her chest when trying to take a deep breath but again only with lying flat.  No other chest pain including pressure or tightness.  States she thinks she did have a subjective fever yesterday.  No vomiting.  No dizziness.  No lower extremity swelling or pain.  No history of CAD, CHF, PE or DVT.  Has had mild dry cough.  Had good relief of her pain with Tylenol yesterday.  She thinks that she may be coming down with a virus.  Denies that this feels similar to his symptoms of acid reflux.  ROS: See HPI Constitutional: no fever  Eyes: no drainage  ENT: no runny nose   Cardiovascular:   chest pain  Resp: SOB  GI: no vomiting GU: no dysuria Integumentary: no rash  Allergy: no hives  Musculoskeletal: no leg swelling  Neurological: no slurred speech ROS otherwise negative  PAST MEDICAL HISTORY/PAST SURGICAL HISTORY:  Past Medical History:  Diagnosis Date  . Anemia   . Angina   . Anxiety   . Carpal tunnel syndrome 12/07/2015   Bilateral  . Diabetes mellitus   . GERD (gastroesophageal reflux disease)   . Hypercholesteremia   . Obesity     MEDICATIONS:  Prior to Admission medications   Medication Sig Start Date End Date Taking? Authorizing Provider  carboxymethylcellulose (REFRESH TEARS) 0.5 % SOLN 1 drop See admin instructions. Instill 1 drop in both eyes every morning and 1 drop in right eye every night at bedtime    [provider]  carboxymethylcellulose (REFRESH) 1 % ophthalmic solution Place 1 drop into the left eye at bedtime.    [provider]  clonazePAM (KLONOPIN) 0.5 MG tablet Take 1 tablet (0.5 mg total) by mouth at bedtime as needed (insomnia).  04/22/14   Kelvin Cellar, MD  cyclobenzaprine (FLEXERIL) 5 MG tablet Take 1 tablet (5 mg total) by mouth 2 (two) times daily as needed for muscle spasms. 10/16/17   Horton, Barbette Hair, MD  Dapagliflozin Propanediol (FARXIGA) 10 MG TABS Take 5 mg by mouth daily.    [provider]  dexlansoprazole (DEXILANT) 60 MG capsule Take 60 mg by mouth daily.     [provider]  diclofenac (CATAFLAM) 50 MG tablet Take 1 tablet (50 mg total) by mouth 3 (three) times daily. For 5 days then as needed for pain 06/17/16   Melony Overly, MD  HYDROcodone-acetaminophen (NORCO) 5-325 MG per tablet Take 0.5-2 tablets by mouth 3 (three) times daily as needed. 1/2 tablet for mild pain, 1 tablet for moderate pain, 2 tablets for severe pain    [provider]  insulin glargine (LANTUS) 100 UNIT/ML injection Inject 10 Units into the skin at bedtime.    [provider]  latanoprost (XALATAN) 0.005 % ophthalmic solution Place 1 drop into both eyes nightly. 12/05/16   [provider]  Liraglutide (VICTOZA) 18 MG/3ML SOPN Inject 7.2 mg into the skin daily. 1.2 mls    [provider]  metFORMIN (GLUCOPHAGE) 1000 MG tablet Take 1,000 mg by mouth 2 (two) times daily with a meal.     [provider]  naproxen (NAPROSYN) 500 MG tablet Take 1 tablet (500 mg total)  by mouth 2 (two) times daily. Limit use to 3-5 days 10/16/17   Horton, Barbette Hair, MD  ramipril (ALTACE) 5 MG capsule Take 5 mg by mouth daily.    [provider]  simvastatin (ZOCOR) 40 MG tablet Take 20 mg by mouth daily at 6 PM.     [provider]  Urea 39 % CREA Apply 1 application topically daily. 02/04/15   Trula Slade, DPM  valACYclovir (VALTREX) 1000 MG tablet Take 500 mg by mouth daily. For outbreak    [provider]  vitamin C (ASCORBIC ACID) 500 MG tablet Take 500 mg by mouth daily.    [provider]    ALLERGIES:  Allergies  Allergen Reactions  . Biaxin  [Clarithromycin] Swelling and Other (See Comments)    Throat swelling Other Reaction: laryngeal edema Throat swelling   . Exenatide Rash and Other (See Comments)    Caused Sweating, sores in the mouth  Also gum soreness/irritation  . Ampicillin Nausea And Vomiting  . Carafate [Sucralfate] Nausea And Vomiting  . Metronidazole Nausea And Vomiting and Nausea Only    Pt stated, "Eyes swelled up"    SOCIAL HISTORY:  Social History   Tobacco Use  . Smoking status: Former Research scientist (life sciences)  . Smokeless tobacco: Never Used  Substance Use Topics  . Alcohol use: Yes    FAMILY HISTORY: Family History  Problem Relation Age of Onset  . Diabetes Mother   . Lung cancer Father   . Diabetes Brother     EXAM: BP (!) 154/82   Pulse 78   Temp 97.7 F (36.5 C) (Oral)   Resp (!) 22   Ht 5\' 8"  (1.727 m)   Wt 83.5 kg (184 lb)   SpO2 98%   BMI 27.98 kg/m  CONSTITUTIONAL: Alert and oriented and responds appropriately to questions. Well-appearing; well-nourished HEAD: Normocephalic EYES: Conjunctivae clear, pupils appear equal, EOMI ENT: normal nose; moist mucous membranes NECK: Supple, no meningismus, no nuchal rigidity, no LAD; no JVD CARD: RRR; S1 and S2 appreciated; no murmurs, no clicks, no rubs, no gallops CHEST:  Chest wall is nontender to palpation.  No crepitus, ecchymosis, erythema, warmth, rash or other lesions present.   RESP: Normal chest excursion without splinting or tachypnea; breath sounds clear and equal bilaterally; no wheezes, no rhonchi, no rales, no hypoxia or respiratory distress, speaking full sentences ABD/GI: Normal bowel sounds; non-distended; soft, non-tender, no rebound, no guarding, no peritoneal signs, no hepatosplenomegaly BACK:  The back appears normal and is non-tender to palpation, there is no CVA tenderness EXT: Normal ROM in all joints; non-tender to palpation; no edema; normal capillary refill; no cyanosis, no calf tenderness or swelling    SKIN: Normal color for  age and race; warm; no rash NEURO: Moves all extremities equally PSYCH: The patient's mood and manner are appropriate. Grooming and personal hygiene are appropriate.  MEDICAL DECISION MAKING: Patient here with chest pain or shortness of breath when lying flat.  Sitting upright she is asymptomatic.  Does not appear volume overloaded on exam.  No history of PE or DVT.  Atypical for ACS.  EKG shows no ischemic abnormality.  Plan is to obtain labs including troponin, d-dimer.  We will also obtain chest x-ray.  ED PROGRESS: Patient's labs are unremarkable.  Normal electrolytes, hemoglobin, negative troponin.  Patient's age-adjusted d-dimer is negative.  Chest x-ray is clear.  Unclear cause for her pleuritic symptoms.  Nothing that appears to be life-threatening today and I feel comfortable with  letting her go home and follow-up with her primary doctor.  Patient and husband are comfortable with this plan.  We discussed at length return precautions.  She has been able to ambulate here without any change in her oxygen saturation and is asymptomatic with no chest discomfort or shortness of breath with exertion.   At this time, I do not feel there is any life-threatening condition present. I have reviewed and discussed all results (EKG, imaging, lab, urine as appropriate) and exam findings with patient/family. I have reviewed nursing notes and appropriate previous records.  I feel the patient is safe to be discharged home without further emergent workup and can continue workup as an outpatient as needed. Discussed usual and customary return precautions. Patient/family verbalize understanding and are comfortable with this plan.  Outpatient follow-up has been provided if needed. All questions have been answered.     EKG Interpretation  Date/Time:  Thursday November 30 2017 00:51:45 EST Ventricular Rate:  76 PR Interval:    QRS Duration: 96 QT Interval:  359 QTC Calculation: 404 R Axis:   62 Text  Interpretation:  Sinus rhythm Low voltage, precordial leads Baseline wander in lead(s) II III aVF No significant change since last tracing Confirmed by Kerensa Nicklas, Cyril Mourning [phone]) on 11/30/2017 1:29:10 AM         Danett Palazzo, Delice Bison, DO 11/30/17 [phone]

## 2017-11-30 NOTE — ED Notes (Signed)
Pt returned from X-ray.  

## 2017-11-30 NOTE — ED Notes (Signed)
No pain at this time.  Pain voiced if patient is laying down or is in a certain position.

## 2017-11-30 NOTE — Discharge Instructions (Signed)
Your labs, EKG and chest x-ray today were normal.  Recommend close follow-up with your primary care physician.  You may continue Tylenol 1000 mg every 6 hours as needed for pain.  Please note that your hydrocodone has Tylenol in it as well.  You cannot take over 4000 mg of Tylenol in a 24-hour period.

## 2017-11-30 NOTE — ED Notes (Signed)
ED Provider at bedside. 

## 2017-11-30 NOTE — Progress Notes (Signed)
Patient ambulated around the department while on pulse ox.  Patient's HR remained in the 80s and SPO2 remained between 96% and 100%.

## 2017-11-30 NOTE — ED Notes (Signed)
Pt verbalizes understanding of d/c instructions and denies any further needs at this time. 

## 2017-12-01 DIAGNOSIS — G5602 Carpal tunnel syndrome, left upper limb: Secondary | ICD-10-CM | POA: Diagnosis not present

## 2018-01-29 DIAGNOSIS — E1165 Type 2 diabetes mellitus with hyperglycemia: Secondary | ICD-10-CM | POA: Diagnosis not present

## 2018-01-29 DIAGNOSIS — E782 Mixed hyperlipidemia: Secondary | ICD-10-CM | POA: Diagnosis not present

## 2018-02-06 IMAGING — CT CT BIOPSY
1 of 5 series · 10 of 32 positions shown, 16 images · non-contrast
Comparison: none

CLINICAL DATA: Low back and left buttock pain. Left leg pain and
numbness. Prior lumbar fusion.

[Series 2: needle -guided injection · axial · 0.88mm/px · z∈[+476,+578]mm · 10 of 63 slices shown, 16 images]
[im 6/63  soft-tissue]
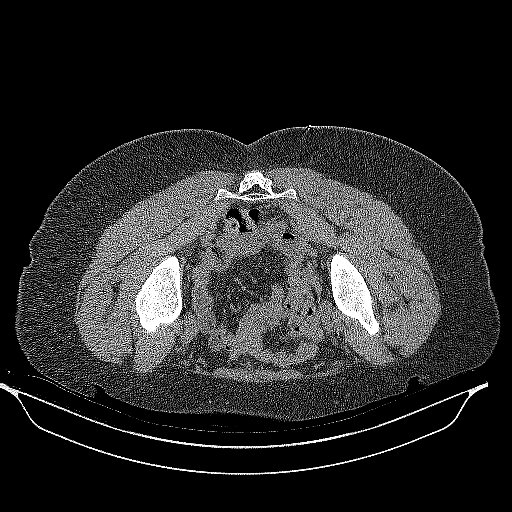
[im 6/63  bone]
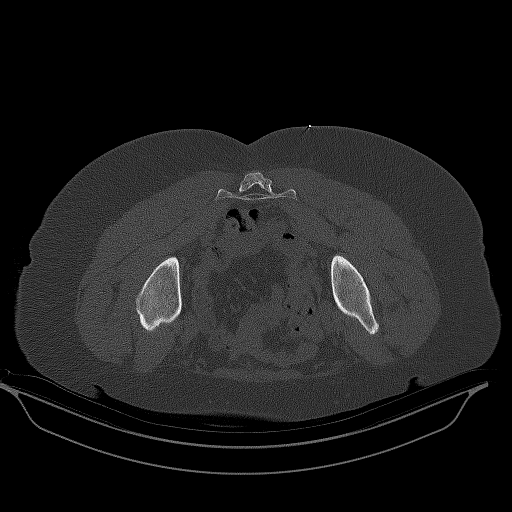
[im 12/63  soft-tissue]
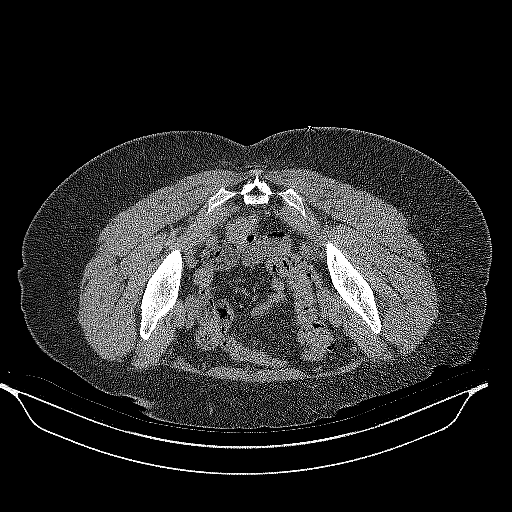
[im 17/63  soft-tissue]
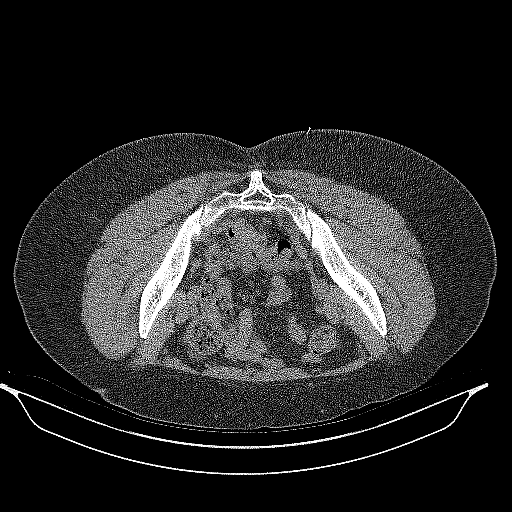
[im 23/63  soft-tissue]
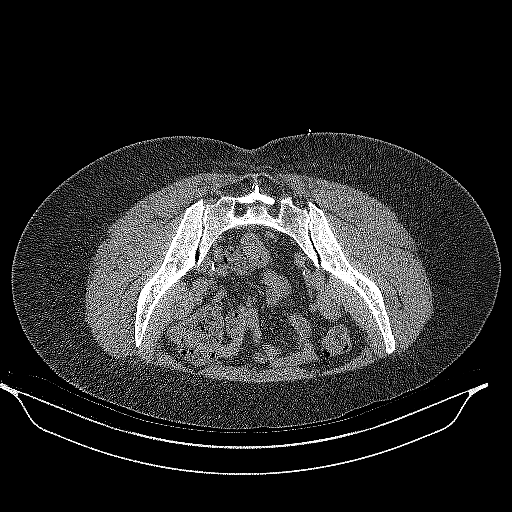
[im 29/63  soft-tissue]
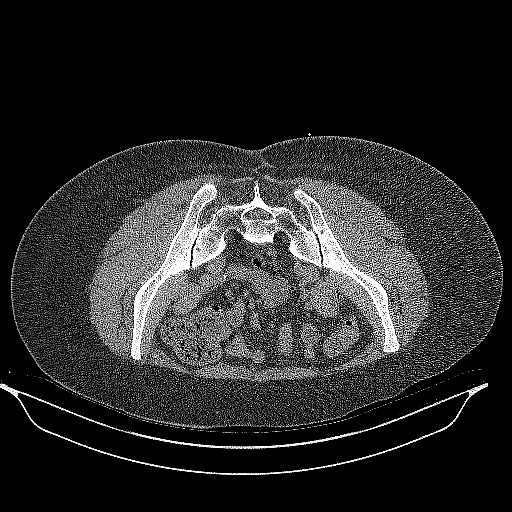
[im 34/63  soft-tissue]
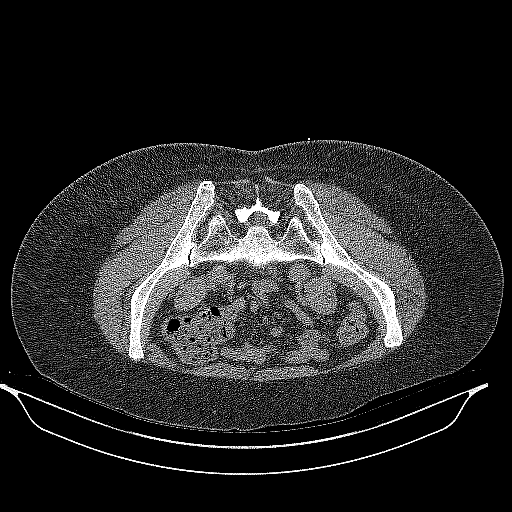
[im 40/63  soft-tissue]
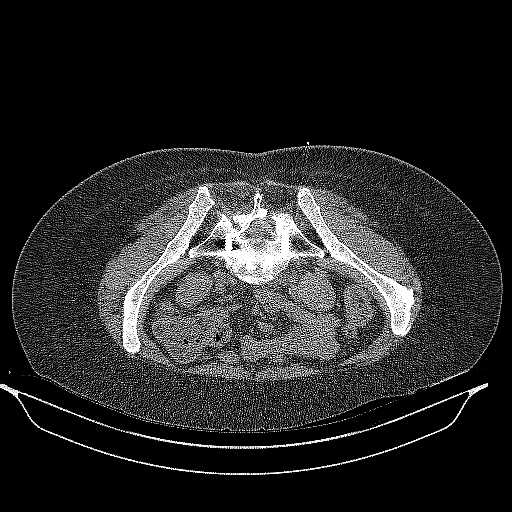
[im 40/63  lung]
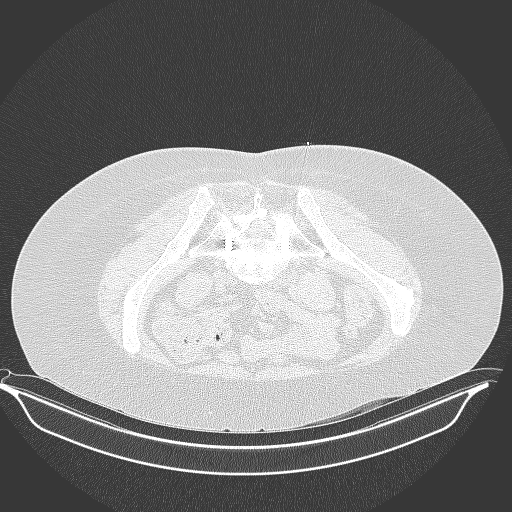
[im 46/63  soft-tissue]
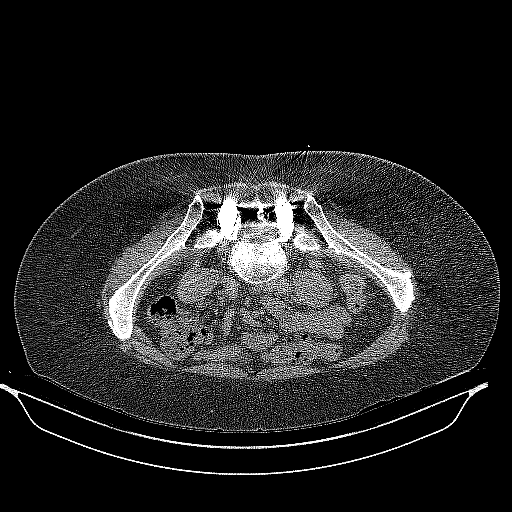
[im 46/63  lung]
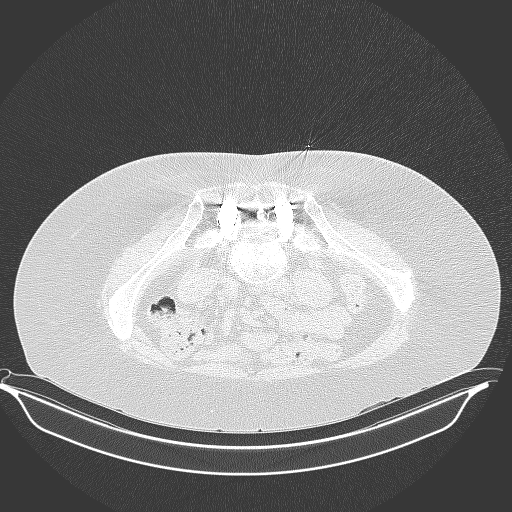
[im 51/63  soft-tissue]
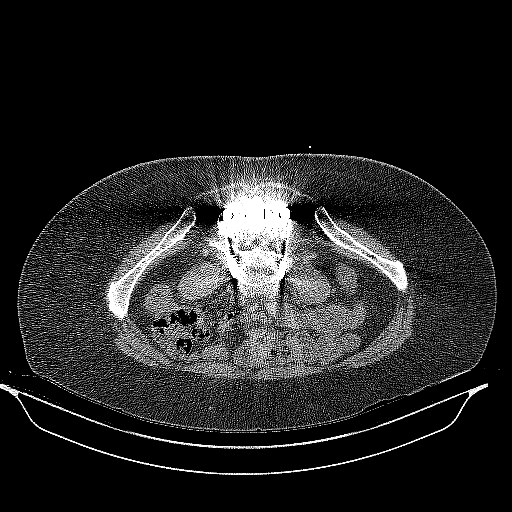
[im 51/63  lung]
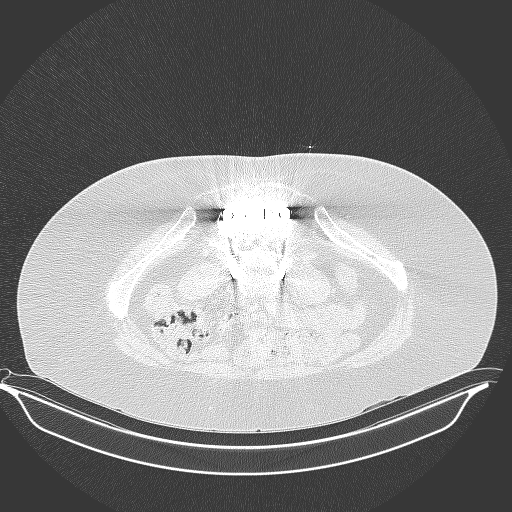
[im 51/63  bone]
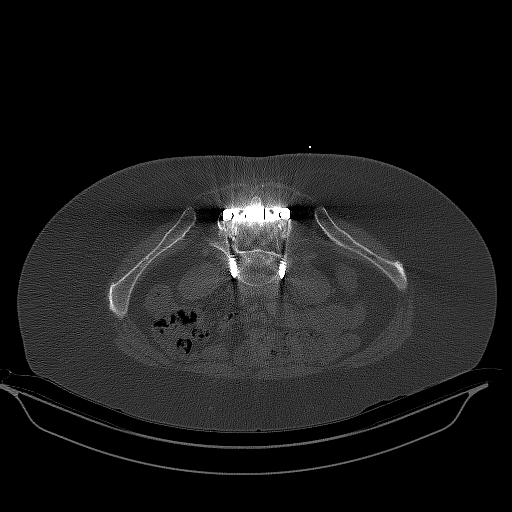
[im 57/63  soft-tissue]
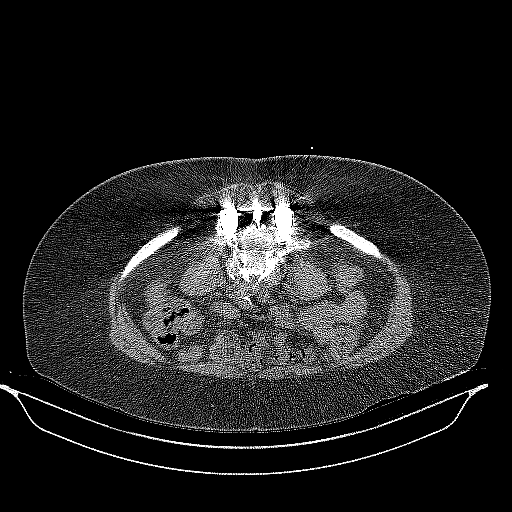
[im 57/63  lung]
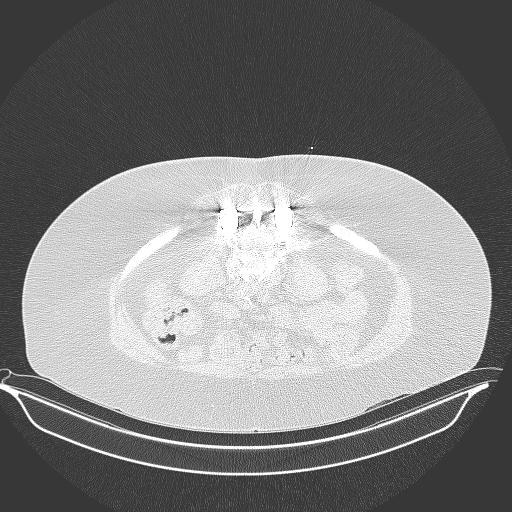

[10 of 32 positions shown; findings below may reference images not displayed]

EXAM:
CT GUIDED LEFT SI JOINT INJECTION



After local anesthesia with 1% lidocaine without epinephrine and
subsequent deep anesthesia, a 3.5 inch 22 gauge spinal needle was
advanced into the left SI joint under intermittent CT guidance.

Once the needle was in satisfactory position, representative image
was captured with the needle demonstrated in the sacroiliac joint.
Injection of 0.5 mL of Isovue M 200 demonstrates intra-articular
contrast spread. Subsequently, 120 mg of Depo-Medrol and 1 mL of
0.5% bupivacaine were injected into the left SI joint. The needle
was removed and a sterile dressing applied.

No complications were observed.
IMPRESSION: Successful CT-guided left SI joint injection.

## 2018-02-07 DIAGNOSIS — M150 Primary generalized (osteo)arthritis: Secondary | ICD-10-CM | POA: Diagnosis not present

## 2018-02-07 DIAGNOSIS — E118 Type 2 diabetes mellitus with unspecified complications: Secondary | ICD-10-CM | POA: Diagnosis not present

## 2018-02-07 DIAGNOSIS — I10 Essential (primary) hypertension: Secondary | ICD-10-CM | POA: Diagnosis not present

## 2018-02-07 DIAGNOSIS — M5136 Other intervertebral disc degeneration, lumbar region: Secondary | ICD-10-CM | POA: Diagnosis not present

## 2018-02-07 DIAGNOSIS — N390 Urinary tract infection, site not specified: Secondary | ICD-10-CM | POA: Diagnosis not present

## 2018-03-05 DIAGNOSIS — I10 Essential (primary) hypertension: Secondary | ICD-10-CM | POA: Diagnosis not present

## 2018-03-05 DIAGNOSIS — E1165 Type 2 diabetes mellitus with hyperglycemia: Secondary | ICD-10-CM | POA: Diagnosis not present

## 2018-03-05 DIAGNOSIS — E118 Type 2 diabetes mellitus with unspecified complications: Secondary | ICD-10-CM | POA: Diagnosis not present

## 2018-03-05 DIAGNOSIS — E782 Mixed hyperlipidemia: Secondary | ICD-10-CM | POA: Diagnosis not present

## 2018-03-26 DIAGNOSIS — H04123 Dry eye syndrome of bilateral lacrimal glands: Secondary | ICD-10-CM | POA: Diagnosis not present

## 2018-03-26 DIAGNOSIS — Z961 Presence of intraocular lens: Secondary | ICD-10-CM | POA: Diagnosis not present

## 2018-03-26 DIAGNOSIS — H401131 Primary open-angle glaucoma, bilateral, mild stage: Secondary | ICD-10-CM | POA: Diagnosis not present

## 2018-03-26 DIAGNOSIS — H00011 Hordeolum externum right upper eyelid: Secondary | ICD-10-CM | POA: Diagnosis not present

## 2018-04-11 DIAGNOSIS — Z01419 Encounter for gynecological examination (general) (routine) without abnormal findings: Secondary | ICD-10-CM | POA: Diagnosis not present

## 2018-04-11 DIAGNOSIS — Z1231 Encounter for screening mammogram for malignant neoplasm of breast: Secondary | ICD-10-CM | POA: Diagnosis not present

## 2018-04-13 ENCOUNTER — Encounter: Payer: Self-pay | Admitting: Podiatry

## 2018-04-13 ENCOUNTER — Ambulatory Visit (INDEPENDENT_AMBULATORY_CARE_PROVIDER_SITE_OTHER): Payer: Medicare Other

## 2018-04-13 ENCOUNTER — Ambulatory Visit (INDEPENDENT_AMBULATORY_CARE_PROVIDER_SITE_OTHER): Payer: Medicare Other | Admitting: Podiatry

## 2018-04-13 DIAGNOSIS — E1151 Type 2 diabetes mellitus with diabetic peripheral angiopathy without gangrene: Secondary | ICD-10-CM

## 2018-04-13 DIAGNOSIS — M779 Enthesopathy, unspecified: Secondary | ICD-10-CM

## 2018-04-13 DIAGNOSIS — M778 Other enthesopathies, not elsewhere classified: Secondary | ICD-10-CM

## 2018-04-13 DIAGNOSIS — L84 Corns and callosities: Secondary | ICD-10-CM | POA: Diagnosis not present

## 2018-04-13 MED ORDER — TRIAMCINOLONE ACETONIDE 10 MG/ML IJ SUSP
10.0000 mg | Freq: Once | INTRAMUSCULAR | Status: AC
  Administered 2018-04-13: 10 mg

## 2018-04-16 NOTE — Progress Notes (Signed)
Subjective:   Patient ID: Bonnie [name], female   DOB: 62 y.o.   MRN: [id number]   HPI Patient presents with inflammation pain around the second metatarsal phalangeal joint and lesion formations plantar right that are very painful when palpated   ROS      Objective:  Physical Exam  Neurovascular status intact with inflammatory lesion formation with fluid buildup underneath the second metatarsal that is painful when palpated with lesions underneath the second and fifth metatarsal     Assessment:  Inflammatory capsulitis with lesion formation     Plan:  H&P condition reviewed and today I went ahead and injected the second MPJ 3 mg Kenalog 5 mg Xylocaine and debrided lesions on the right foot with no iatrogenic bleeding noted

## 2018-04-24 DIAGNOSIS — M5412 Radiculopathy, cervical region: Secondary | ICD-10-CM | POA: Insufficient documentation

## 2018-04-24 DIAGNOSIS — M259 Joint disorder, unspecified: Secondary | ICD-10-CM | POA: Diagnosis not present

## 2018-04-24 DIAGNOSIS — M542 Cervicalgia: Secondary | ICD-10-CM | POA: Diagnosis not present

## 2018-05-28 DIAGNOSIS — T83728A Exposure of other implanted mesh and other prosthetic materials to surrounding organ or tissue, initial encounter: Secondary | ICD-10-CM | POA: Diagnosis not present

## 2018-05-28 DIAGNOSIS — N952 Postmenopausal atrophic vaginitis: Secondary | ICD-10-CM | POA: Diagnosis not present

## 2018-06-14 ENCOUNTER — Other Ambulatory Visit: Payer: Self-pay | Admitting: Physician Assistant

## 2018-06-14 DIAGNOSIS — M259 Joint disorder, unspecified: Secondary | ICD-10-CM

## 2018-06-20 ENCOUNTER — Ambulatory Visit: Payer: Medicare Other | Admitting: Podiatry

## 2018-06-21 ENCOUNTER — Ambulatory Visit
Admission: RE | Admit: 2018-06-21 | Discharge: 2018-06-21 | Disposition: A | Discharge status: Home / Self Care | Payer: Medicare Other | Source: Ambulatory Visit | Attending: Physician Assistant | Admitting: Physician Assistant

## 2018-06-21 DIAGNOSIS — M259 Joint disorder, unspecified: Secondary | ICD-10-CM

## 2018-06-21 DIAGNOSIS — M533 Sacrococcygeal disorders, not elsewhere classified: Secondary | ICD-10-CM | POA: Diagnosis not present

## 2018-06-21 MED ORDER — METHYLPREDNISOLONE ACETATE 40 MG/ML INJ SUSP (RADIOLOG: 120.0000 mg | Freq: Once | INTRAMUSCULAR | Status: DC

## 2018-06-26 ENCOUNTER — Ambulatory Visit: Payer: Medicare Other | Admitting: Podiatry

## 2018-07-03 ENCOUNTER — Encounter: Payer: Self-pay | Admitting: Podiatry

## 2018-07-03 ENCOUNTER — Ambulatory Visit: Payer: Medicare Other | Admitting: Podiatry

## 2018-07-03 DIAGNOSIS — E782 Mixed hyperlipidemia: Secondary | ICD-10-CM | POA: Diagnosis not present

## 2018-07-03 DIAGNOSIS — Q828 Other specified congenital malformations of skin: Secondary | ICD-10-CM | POA: Diagnosis not present

## 2018-07-03 DIAGNOSIS — E1165 Type 2 diabetes mellitus with hyperglycemia: Secondary | ICD-10-CM | POA: Diagnosis not present

## 2018-07-03 DIAGNOSIS — M79676 Pain in unspecified toe(s): Secondary | ICD-10-CM

## 2018-07-03 DIAGNOSIS — E118 Type 2 diabetes mellitus with unspecified complications: Secondary | ICD-10-CM | POA: Diagnosis not present

## 2018-07-03 DIAGNOSIS — B351 Tinea unguium: Secondary | ICD-10-CM

## 2018-07-03 DIAGNOSIS — M7751 Other enthesopathy of right foot: Secondary | ICD-10-CM | POA: Diagnosis not present

## 2018-07-03 DIAGNOSIS — M778 Other enthesopathies, not elsewhere classified: Secondary | ICD-10-CM

## 2018-07-03 DIAGNOSIS — I10 Essential (primary) hypertension: Secondary | ICD-10-CM | POA: Diagnosis not present

## 2018-07-03 DIAGNOSIS — E1149 Type 2 diabetes mellitus with other diabetic neurological complication: Secondary | ICD-10-CM | POA: Diagnosis not present

## 2018-07-03 DIAGNOSIS — M779 Enthesopathy, unspecified: Secondary | ICD-10-CM

## 2018-07-04 NOTE — Progress Notes (Signed)
She presents today chief concern of painful elongated toenails and pain beneath the second metatarsal phalangeal joint of the right foot.  Objective: Vital signs are stable she is alert and oriented x3 pulses are palpable.  Neurologic sensorium is intact degenerative flexors are intact muscle strength is normal symmetrical bilateral.  Slight diminished sensation forefoot bilateral but minimally so.  She has pain on palpation of the second metatarsal phalangeal joint with hammertoe deformities 2 through 5 right.  Multiple reactive hyper keratomas plantar aspect of the bilateral foot  Assessment: Limb secondary to onychomycosis and diabetes mellitus.  Porokeratosis and capsulitis of the second metatarsal phalangeal joint.  Plan: Discussed etiology pathology conservative versus surgical therapies.  At this point I debrided all reactive hyperkeratosis debrided toenails and I also injected the second metatarsal phalangeal joint today with 20 mg Kenalog 5 mg Marcaine point maximal tenderness second interspace right foot.

## 2018-07-06 DIAGNOSIS — H02889 Meibomian gland dysfunction of unspecified eye, unspecified eyelid: Secondary | ICD-10-CM | POA: Diagnosis not present

## 2018-07-06 DIAGNOSIS — Z9841 Cataract extraction status, right eye: Secondary | ICD-10-CM | POA: Diagnosis not present

## 2018-07-06 DIAGNOSIS — Z9842 Cataract extraction status, left eye: Secondary | ICD-10-CM | POA: Diagnosis not present

## 2018-07-06 DIAGNOSIS — H59031 Cystoid macular edema following cataract surgery, right eye: Secondary | ICD-10-CM | POA: Diagnosis not present

## 2018-07-06 DIAGNOSIS — Z961 Presence of intraocular lens: Secondary | ICD-10-CM | POA: Diagnosis not present

## 2018-07-10 DIAGNOSIS — E1121 Type 2 diabetes mellitus with diabetic nephropathy: Secondary | ICD-10-CM | POA: Diagnosis not present

## 2018-07-10 DIAGNOSIS — E782 Mixed hyperlipidemia: Secondary | ICD-10-CM | POA: Diagnosis not present

## 2018-07-10 DIAGNOSIS — E1165 Type 2 diabetes mellitus with hyperglycemia: Secondary | ICD-10-CM | POA: Diagnosis not present

## 2018-07-10 DIAGNOSIS — I10 Essential (primary) hypertension: Secondary | ICD-10-CM | POA: Diagnosis not present

## 2018-07-10 DIAGNOSIS — Z794 Long term (current) use of insulin: Secondary | ICD-10-CM | POA: Diagnosis not present

## 2018-07-12 DIAGNOSIS — Z961 Presence of intraocular lens: Secondary | ICD-10-CM | POA: Diagnosis not present

## 2018-07-12 DIAGNOSIS — E113293 Type 2 diabetes mellitus with mild nonproliferative diabetic retinopathy without macular edema, bilateral: Secondary | ICD-10-CM | POA: Diagnosis not present

## 2018-07-12 DIAGNOSIS — H3581 Retinal edema: Secondary | ICD-10-CM | POA: Diagnosis not present

## 2018-07-12 DIAGNOSIS — H59031 Cystoid macular edema following cataract surgery, right eye: Secondary | ICD-10-CM | POA: Diagnosis not present

## 2018-07-20 DIAGNOSIS — M5412 Radiculopathy, cervical region: Secondary | ICD-10-CM | POA: Diagnosis not present

## 2018-07-20 DIAGNOSIS — M545 Low back pain: Secondary | ICD-10-CM | POA: Diagnosis not present

## 2018-08-06 DIAGNOSIS — M546 Pain in thoracic spine: Secondary | ICD-10-CM | POA: Diagnosis not present

## 2018-08-17 DIAGNOSIS — Z23 Encounter for immunization: Secondary | ICD-10-CM | POA: Diagnosis not present

## 2018-08-24 DIAGNOSIS — M5416 Radiculopathy, lumbar region: Secondary | ICD-10-CM | POA: Diagnosis not present

## 2018-09-05 DIAGNOSIS — M5126 Other intervertebral disc displacement, lumbar region: Secondary | ICD-10-CM | POA: Diagnosis not present

## 2018-09-05 DIAGNOSIS — H6120 Impacted cerumen, unspecified ear: Secondary | ICD-10-CM | POA: Diagnosis not present

## 2018-09-05 DIAGNOSIS — M47816 Spondylosis without myelopathy or radiculopathy, lumbar region: Secondary | ICD-10-CM | POA: Diagnosis not present

## 2018-09-05 DIAGNOSIS — M5416 Radiculopathy, lumbar region: Secondary | ICD-10-CM | POA: Diagnosis not present

## 2018-09-14 DIAGNOSIS — R030 Elevated blood-pressure reading, without diagnosis of hypertension: Secondary | ICD-10-CM | POA: Diagnosis not present

## 2018-09-14 DIAGNOSIS — M549 Dorsalgia, unspecified: Secondary | ICD-10-CM | POA: Diagnosis not present

## 2018-09-17 DIAGNOSIS — H401131 Primary open-angle glaucoma, bilateral, mild stage: Secondary | ICD-10-CM | POA: Diagnosis not present

## 2018-09-17 DIAGNOSIS — M25551 Pain in right hip: Secondary | ICD-10-CM | POA: Diagnosis not present

## 2018-09-18 DIAGNOSIS — N816 Rectocele: Secondary | ICD-10-CM | POA: Diagnosis not present

## 2018-09-18 DIAGNOSIS — Z01818 Encounter for other preprocedural examination: Secondary | ICD-10-CM | POA: Diagnosis not present

## 2018-09-18 DIAGNOSIS — E113293 Type 2 diabetes mellitus with mild nonproliferative diabetic retinopathy without macular edema, bilateral: Secondary | ICD-10-CM | POA: Diagnosis not present

## 2018-09-18 DIAGNOSIS — K219 Gastro-esophageal reflux disease without esophagitis: Secondary | ICD-10-CM | POA: Diagnosis not present

## 2018-09-18 DIAGNOSIS — N952 Postmenopausal atrophic vaginitis: Secondary | ICD-10-CM | POA: Diagnosis not present

## 2018-09-18 DIAGNOSIS — Z794 Long term (current) use of insulin: Secondary | ICD-10-CM | POA: Diagnosis not present

## 2018-09-24 DIAGNOSIS — Z961 Presence of intraocular lens: Secondary | ICD-10-CM | POA: Diagnosis not present

## 2018-09-24 DIAGNOSIS — H401131 Primary open-angle glaucoma, bilateral, mild stage: Secondary | ICD-10-CM | POA: Diagnosis not present

## 2018-10-05 DIAGNOSIS — Z79891 Long term (current) use of opiate analgesic: Secondary | ICD-10-CM | POA: Diagnosis not present

## 2018-10-05 DIAGNOSIS — Z794 Long term (current) use of insulin: Secondary | ICD-10-CM | POA: Diagnosis not present

## 2018-10-05 DIAGNOSIS — Z9889 Other specified postprocedural states: Secondary | ICD-10-CM | POA: Diagnosis not present

## 2018-10-05 DIAGNOSIS — L905 Scar conditions and fibrosis of skin: Secondary | ICD-10-CM | POA: Diagnosis not present

## 2018-10-05 DIAGNOSIS — Z7951 Long term (current) use of inhaled steroids: Secondary | ICD-10-CM | POA: Diagnosis not present

## 2018-10-05 DIAGNOSIS — Z79899 Other long term (current) drug therapy: Secondary | ICD-10-CM | POA: Diagnosis not present

## 2018-10-05 DIAGNOSIS — N952 Postmenopausal atrophic vaginitis: Secondary | ICD-10-CM | POA: Diagnosis not present

## 2018-10-05 DIAGNOSIS — T83728A Exposure of other implanted mesh and other prosthetic materials to surrounding organ or tissue, initial encounter: Secondary | ICD-10-CM | POA: Diagnosis not present

## 2018-10-05 DIAGNOSIS — T85848A Pain due to other internal prosthetic devices, implants and grafts, initial encounter: Secondary | ICD-10-CM | POA: Diagnosis not present

## 2018-10-09 DIAGNOSIS — E1165 Type 2 diabetes mellitus with hyperglycemia: Secondary | ICD-10-CM | POA: Diagnosis not present

## 2018-10-09 DIAGNOSIS — Z0000 Encounter for general adult medical examination without abnormal findings: Secondary | ICD-10-CM | POA: Diagnosis not present

## 2018-10-09 DIAGNOSIS — I10 Essential (primary) hypertension: Secondary | ICD-10-CM | POA: Diagnosis not present

## 2018-10-09 DIAGNOSIS — E782 Mixed hyperlipidemia: Secondary | ICD-10-CM | POA: Diagnosis not present

## 2018-10-18 DIAGNOSIS — Z794 Long term (current) use of insulin: Secondary | ICD-10-CM | POA: Diagnosis not present

## 2018-10-18 DIAGNOSIS — Z961 Presence of intraocular lens: Secondary | ICD-10-CM | POA: Diagnosis not present

## 2018-10-18 DIAGNOSIS — H401131 Primary open-angle glaucoma, bilateral, mild stage: Secondary | ICD-10-CM | POA: Diagnosis not present

## 2018-10-18 DIAGNOSIS — H59031 Cystoid macular edema following cataract surgery, right eye: Secondary | ICD-10-CM | POA: Diagnosis not present

## 2018-10-18 DIAGNOSIS — E113293 Type 2 diabetes mellitus with mild nonproliferative diabetic retinopathy without macular edema, bilateral: Secondary | ICD-10-CM | POA: Diagnosis not present

## 2018-10-19 DIAGNOSIS — E1121 Type 2 diabetes mellitus with diabetic nephropathy: Secondary | ICD-10-CM | POA: Diagnosis not present

## 2018-10-19 DIAGNOSIS — E1165 Type 2 diabetes mellitus with hyperglycemia: Secondary | ICD-10-CM | POA: Diagnosis not present

## 2018-10-19 DIAGNOSIS — E113293 Type 2 diabetes mellitus with mild nonproliferative diabetic retinopathy without macular edema, bilateral: Secondary | ICD-10-CM | POA: Diagnosis not present

## 2018-10-19 DIAGNOSIS — Z0000 Encounter for general adult medical examination without abnormal findings: Secondary | ICD-10-CM | POA: Diagnosis not present

## 2018-11-01 DIAGNOSIS — M25512 Pain in left shoulder: Secondary | ICD-10-CM | POA: Diagnosis not present

## 2018-11-10 DIAGNOSIS — M25512 Pain in left shoulder: Secondary | ICD-10-CM | POA: Diagnosis not present

## 2018-12-06 DIAGNOSIS — M25512 Pain in left shoulder: Secondary | ICD-10-CM | POA: Diagnosis not present

## 2018-12-07 DIAGNOSIS — E1121 Type 2 diabetes mellitus with diabetic nephropathy: Secondary | ICD-10-CM | POA: Diagnosis not present

## 2018-12-07 DIAGNOSIS — E1165 Type 2 diabetes mellitus with hyperglycemia: Secondary | ICD-10-CM | POA: Diagnosis not present

## 2019-01-25 DIAGNOSIS — E782 Mixed hyperlipidemia: Secondary | ICD-10-CM | POA: Diagnosis not present

## 2019-01-25 DIAGNOSIS — E1121 Type 2 diabetes mellitus with diabetic nephropathy: Secondary | ICD-10-CM | POA: Diagnosis not present

## 2019-01-25 DIAGNOSIS — I10 Essential (primary) hypertension: Secondary | ICD-10-CM | POA: Diagnosis not present

## 2019-01-25 DIAGNOSIS — E1165 Type 2 diabetes mellitus with hyperglycemia: Secondary | ICD-10-CM | POA: Diagnosis not present

## 2019-02-01 DIAGNOSIS — E1165 Type 2 diabetes mellitus with hyperglycemia: Secondary | ICD-10-CM | POA: Diagnosis not present

## 2019-02-01 DIAGNOSIS — E1121 Type 2 diabetes mellitus with diabetic nephropathy: Secondary | ICD-10-CM | POA: Diagnosis not present

## 2019-02-01 DIAGNOSIS — I10 Essential (primary) hypertension: Secondary | ICD-10-CM | POA: Diagnosis not present

## 2019-02-01 DIAGNOSIS — E782 Mixed hyperlipidemia: Secondary | ICD-10-CM | POA: Diagnosis not present

## 2019-02-08 DIAGNOSIS — Y999 Unspecified external cause status: Secondary | ICD-10-CM | POA: Diagnosis not present

## 2019-02-08 DIAGNOSIS — T2056XA Corrosion of first degree of forehead and cheek, initial encounter: Secondary | ICD-10-CM | POA: Diagnosis not present

## 2019-02-08 DIAGNOSIS — Z79899 Other long term (current) drug therapy: Secondary | ICD-10-CM | POA: Insufficient documentation

## 2019-02-08 DIAGNOSIS — S0993XA Unspecified injury of face, initial encounter: Secondary | ICD-10-CM | POA: Diagnosis present

## 2019-02-08 DIAGNOSIS — T65891A Toxic effect of other specified substances, accidental (unintentional), initial encounter: Secondary | ICD-10-CM | POA: Diagnosis not present

## 2019-02-08 DIAGNOSIS — Y93E5 Activity, floor mopping and cleaning: Secondary | ICD-10-CM | POA: Diagnosis not present

## 2019-02-08 DIAGNOSIS — E119 Type 2 diabetes mellitus without complications: Secondary | ICD-10-CM | POA: Diagnosis not present

## 2019-02-08 DIAGNOSIS — Y92000 Kitchen of unspecified non-institutional (private) residence as  the place of occurrence of the external cause: Secondary | ICD-10-CM | POA: Diagnosis not present

## 2019-02-08 DIAGNOSIS — Z87891 Personal history of nicotine dependence: Secondary | ICD-10-CM | POA: Insufficient documentation

## 2019-02-08 DIAGNOSIS — Z794 Long term (current) use of insulin: Secondary | ICD-10-CM | POA: Diagnosis not present

## 2019-02-08 DIAGNOSIS — X58XXXA Exposure to other specified factors, initial encounter: Secondary | ICD-10-CM | POA: Insufficient documentation

## 2019-02-08 DIAGNOSIS — I10 Essential (primary) hypertension: Secondary | ICD-10-CM | POA: Diagnosis not present

## 2019-02-08 DIAGNOSIS — T310 Burns involving less than 10% of body surface: Secondary | ICD-10-CM | POA: Diagnosis not present

## 2019-02-08 NOTE — ED Triage Notes (Signed)
Patient states around 12 pm she was cleaning her oven and the chemical got on her face. Patient has redness to the right side of face. Patient states she was scared to go to sleep with this on her face. Patient states she did was her face.

## 2019-02-09 ENCOUNTER — Emergency Department (HOSPITAL_COMMUNITY)
Admission: EM | Admit: 2019-02-09 | Discharge: 2019-02-09 | Disposition: A | Discharge status: Home / Self Care | Payer: Medicare Other | Attending: Emergency Medicine | Admitting: Emergency Medicine

## 2019-02-09 ENCOUNTER — Other Ambulatory Visit: Payer: Self-pay

## 2019-02-09 ENCOUNTER — Encounter (HOSPITAL_COMMUNITY): Payer: Self-pay | Admitting: Emergency Medicine

## 2019-02-09 DIAGNOSIS — T300 Burn of unspecified body region, unspecified degree: Secondary | ICD-10-CM
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
## 2019-02-09 NOTE — ED Notes (Signed)
PT DISCHARGED. INSTRUCTIONS GIVEN. AAOX4. PT IN NO APPARENT DISTRESS WITH MILD PAIN. THE OPPORTUNITY TO ASK QUESTIONS WAS PROVIDED. 
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
## 2019-02-21 DIAGNOSIS — H401131 Primary open-angle glaucoma, bilateral, mild stage: Secondary | ICD-10-CM | POA: Diagnosis not present

## 2019-02-21 DIAGNOSIS — E113212 Type 2 diabetes mellitus with mild nonproliferative diabetic retinopathy with macular edema, left eye: Secondary | ICD-10-CM | POA: Diagnosis not present

## 2019-02-21 DIAGNOSIS — H59031 Cystoid macular edema following cataract surgery, right eye: Secondary | ICD-10-CM | POA: Diagnosis not present

## 2019-02-21 DIAGNOSIS — Z794 Long term (current) use of insulin: Secondary | ICD-10-CM | POA: Diagnosis not present

## 2019-02-21 DIAGNOSIS — E113291 Type 2 diabetes mellitus with mild nonproliferative diabetic retinopathy without macular edema, right eye: Secondary | ICD-10-CM | POA: Diagnosis not present

## 2019-03-19 ENCOUNTER — Ambulatory Visit: Payer: Medicare Other | Admitting: Podiatry

## 2019-04-04 DIAGNOSIS — E113212 Type 2 diabetes mellitus with mild nonproliferative diabetic retinopathy with macular edema, left eye: Secondary | ICD-10-CM | POA: Diagnosis not present

## 2019-04-23 ENCOUNTER — Encounter: Payer: Self-pay | Admitting: Podiatry

## 2019-04-23 ENCOUNTER — Other Ambulatory Visit: Payer: Self-pay

## 2019-04-23 ENCOUNTER — Ambulatory Visit: Payer: Medicare Other | Admitting: Podiatry

## 2019-04-23 VITALS — Temp 97.2°F

## 2019-04-23 DIAGNOSIS — M79676 Pain in unspecified toe(s): Secondary | ICD-10-CM | POA: Diagnosis not present

## 2019-04-23 DIAGNOSIS — L84 Corns and callosities: Secondary | ICD-10-CM

## 2019-04-23 DIAGNOSIS — B353 Tinea pedis: Secondary | ICD-10-CM

## 2019-04-23 DIAGNOSIS — E119 Type 2 diabetes mellitus without complications: Secondary | ICD-10-CM | POA: Diagnosis not present

## 2019-04-23 DIAGNOSIS — Z794 Long term (current) use of insulin: Secondary | ICD-10-CM | POA: Diagnosis not present

## 2019-04-23 DIAGNOSIS — B351 Tinea unguium: Secondary | ICD-10-CM | POA: Diagnosis not present

## 2019-04-23 DIAGNOSIS — I10 Essential (primary) hypertension: Secondary | ICD-10-CM | POA: Insufficient documentation

## 2019-04-23 MED ORDER — CICLOPIROX OLAMINE 0.77 % EX CREA: TOPICAL_CREAM | Freq: Two times a day (BID) | CUTANEOUS | 1 refills | Status: AC

## 2019-04-23 NOTE — Patient Instructions (Signed)
Diabetes Mellitus and Foot Care  Foot care is an important part of your health, especially when you have diabetes. Diabetes may cause you to have problems because of poor blood flow (circulation) to your feet and legs, which can cause your skin to:   Become thinner and drier.   Break more easily.   Heal more slowly.   Peel and crack.  You may also have nerve damage (neuropathy) in your legs and feet, causing decreased feeling in them. This means that you may not notice minor injuries to your feet that could lead to more serious problems. Noticing and addressing any potential problems early is the best way to prevent future foot problems.  How to care for your feet  Foot hygiene   Wash your feet daily with warm water and mild soap. Do not use hot water. Then, pat your feet and the areas between your toes until they are completely dry. Do not soak your feet as this can dry your skin.   Trim your toenails straight across. Do not dig under them or around the cuticle. File the edges of your nails with an emery board or nail file.   Apply a moisturizing lotion or petroleum jelly to the skin on your feet and to dry, brittle toenails. Use lotion that does not contain alcohol and is unscented. Do not apply lotion between your toes.  Shoes and socks   Wear clean socks or stockings every day. Make sure they are not too tight. Do not wear knee-high stockings since they may decrease blood flow to your legs.   Wear shoes that fit properly and have enough cushioning. Always look in your shoes before you put them on to be sure there are no objects inside.   To break in new shoes, wear them for just a few hours a day. This prevents injuries on your feet.  Wounds, scrapes, corns, and calluses   Check your feet daily for blisters, cuts, bruises, sores, and redness. If you cannot see the bottom of your feet, use a mirror or ask someone for help.   Do not cut corns or calluses or try to remove them with medicine.   If you  find a minor scrape, cut, or break in the skin on your feet, keep it and the skin around it clean and dry. You may clean these areas with mild soap and water. Do not clean the area with peroxide, alcohol, or iodine.   If you have a wound, scrape, corn, or callus on your foot, look at it several times a day to make sure it is healing and not infected. Check for:  ? Redness, swelling, or pain.  ? Fluid or blood.  ? Warmth.  ? Pus or a bad smell.  General instructions   Do not cross your legs. This may decrease blood flow to your feet.   Do not use heating pads or hot water bottles on your feet. They may burn your skin. If you have lost feeling in your feet or legs, you may not know this is happening until it is too late.   Protect your feet from hot and cold by wearing shoes, such as at the beach or on hot pavement.   Schedule a complete foot exam at least once a year (annually) or more often if you have foot problems. If you have foot problems, report any cuts, sores, or bruises to your health care provider immediately.  Contact a health care provider if:     You have a medical condition that increases your risk of infection and you have any cuts, sores, or bruises on your feet.   You have an injury that is not healing.   You have redness on your legs or feet.   You feel burning or tingling in your legs or feet.   You have pain or cramps in your legs and feet.   Your legs or feet are numb.   Your feet always feel cold.   You have pain around a toenail.  Get help right away if:   You have a wound, scrape, corn, or callus on your foot and:  ? You have pain, swelling, or redness that gets worse.  ? You have fluid or blood coming from the wound, scrape, corn, or callus.  ? Your wound, scrape, corn, or callus feels warm to the touch.  ? You have pus or a bad smell coming from the wound, scrape, corn, or callus.  ? You have a fever.  ? You have a red line going up your leg.  Summary   Check your feet every day  for cuts, sores, red spots, swelling, and blisters.   Moisturize feet and legs daily.   Wear shoes that fit properly and have enough cushioning.   If you have foot problems, report any cuts, sores, or bruises to your health care provider immediately.   Schedule a complete foot exam at least once a year (annually) or more often if you have foot problems.  This information is not intended to replace advice given to you by your health care provider. Make sure you discuss any questions you have with your health care provider.  Document Released: 10/28/2000 Document Revised: 12/13/2017 Document Reviewed: 12/02/2016  Elsevier Interactive Patient Education  2019 [employer]

## 2019-05-02 NOTE — Progress Notes (Signed)
Subjective: Bonnie [name] presents to clinic with cc of painful mycotic toenails and plantar lesions b/l feet which are aggravated when weightbearing with and without shoe gear.  This pain limits her daily activities. Pain symptoms resolve with periodic professional debridement.  Bonnie Seashore, MD is her PCP and last visit was 2 months ago.  Medications reviewed.  Allergies  Allergen Reactions  . Clarithromycin Swelling, Other (See Comments), Anxiety and Shortness Of Breath    Throat swelling Other Reaction: laryngeal edema Throat swelling  Throat swelling Other Reaction: laryngeal edema Other reaction(s): Other (See Comments) Throat swelling Other Reaction: laryngeal edema Throat swelling   . Exenatide Rash, Other (See Comments) and Anaphylaxis    Caused Sweating, sores in the mouth  Also gum soreness/irritation Caused Sweating, sores in the mouth  Caused Sweating, sores in the mouth  Caused Sweating, sores in the mouth  Also gum soreness/irritation   . Ibuprofen Rash and Other (See Comments)    Nose bleed  . Methocarbamol Anaphylaxis    Other reaction(s): Other (See Comments) Swollen eyes Swollen eyes  . Shellfish Allergy Other (See Comments)    Nausea and vomitting  . Ampicillin Nausea And Vomiting  . Carafate [Sucralfate] Nausea And Vomiting  . Cyanocobalamin   . Metronidazole Nausea And Vomiting and Nausea Only    Pt stated, "Eyes swelled up" Pt stated, "Eyes swelled up" Pt stated, "Eyes swelled up"  . Vitamin B12 Swelling  . Ketoconazole Palpitations and Nausea And Vomiting    Other reaction(s): Irregular Heart Rate Other reaction(s): Irregular Heart Rate   . Mango Flavor Itching and Rash  . Pineapple Rash  . Strawberry Flavor Itching     Objective: Vitals:   04/23/19 1047  Temp: (!) 97.2 F (36.2 C)    Physical Examination:  Vascular  Examination: Capillary refill time immediate x 10 digits.  DP pulses 2/4 b/l.  PT pulses 1/4  b/l.  Digital hair sparse b/l.  No edema noted b/l.  Skin temperature gradient WNL b/l.  Dermatological Examination: Skin with normal turgor, texture and tone b/l.  No open wounds b/l.  No interdigital macerations noted b/l.  Elongated, thick, discolored brittle toenails with subungual debris and pain on dorsal palpation of nailbeds 1-5 b/l.  Hyperkeratotic lesion submet head 5 b/l and submet head 1 b/l with tenderness to palpation. No edema, no erythema, no drainage, no flocculence.  Diffuse scaling noted peripherally and plantarly b/l feet with mild foot odor.  No interdigital macerations.  No blisters, no weeping. No signs of secondary bacterial infection noted.  Musculoskeletal Examination: Muscle strength 5/5 to all muscle groups b/l.  Hammertoes deformity, flexible with prominent metatarsal heads b/l.  No pain, crepitus or joint discomfort with active/passive ROM.  Neurological Examination: Sensation intact 5/5 b/l with 10 gram monofilament.  Vibratory sensation diminished b/l.  Proprioceptive sensation intact b/l.  Assessment: 1. Mycotic nail infection with pain 1-5 b/l 2. Tinea pedis b/l 3. Calluses submet heads 1, 5 b/l 4.   NIDDM  Plan: 1. Toenails 1-5 b/l were debrided in length and girth without iatrogenic laceration. 2. Calluses pared submetatarsal head(s) 1, 5 b/l utilizing sterile scalpel blade without incident. 3. Prescription written for ciclopirox cream 0.77%.  Patient is to apply to both feet and between toes twice daily for 4 weeks. 4. Continue soft, supportive shoe gear daily. 5. Report any pedal injuries to medical professional. 6. Follow up 3 months. 7. Patient/POA to call should there be a question/concern in there interim.

## 2019-05-16 DIAGNOSIS — Z1231 Encounter for screening mammogram for malignant neoplasm of breast: Secondary | ICD-10-CM | POA: Diagnosis not present

## 2019-05-16 DIAGNOSIS — Z794 Long term (current) use of insulin: Secondary | ICD-10-CM | POA: Diagnosis not present

## 2019-05-16 DIAGNOSIS — E113212 Type 2 diabetes mellitus with mild nonproliferative diabetic retinopathy with macular edema, left eye: Secondary | ICD-10-CM | POA: Diagnosis not present

## 2019-05-16 DIAGNOSIS — E113291 Type 2 diabetes mellitus with mild nonproliferative diabetic retinopathy without macular edema, right eye: Secondary | ICD-10-CM | POA: Diagnosis not present

## 2019-05-21 ENCOUNTER — Ambulatory Visit: Payer: Medicare Other | Admitting: Podiatry

## 2019-06-27 DIAGNOSIS — H0014 Chalazion left upper eyelid: Secondary | ICD-10-CM | POA: Insufficient documentation

## 2019-06-27 DIAGNOSIS — H401131 Primary open-angle glaucoma, bilateral, mild stage: Secondary | ICD-10-CM | POA: Diagnosis not present

## 2019-06-27 DIAGNOSIS — E113291 Type 2 diabetes mellitus with mild nonproliferative diabetic retinopathy without macular edema, right eye: Secondary | ICD-10-CM | POA: Diagnosis not present

## 2019-06-27 DIAGNOSIS — R55 Syncope and collapse: Secondary | ICD-10-CM | POA: Diagnosis not present

## 2019-06-27 DIAGNOSIS — E113212 Type 2 diabetes mellitus with mild nonproliferative diabetic retinopathy with macular edema, left eye: Secondary | ICD-10-CM | POA: Diagnosis not present

## 2019-06-27 DIAGNOSIS — Z794 Long term (current) use of insulin: Secondary | ICD-10-CM | POA: Diagnosis not present

## 2019-06-27 DIAGNOSIS — Z7189 Other specified counseling: Secondary | ICD-10-CM | POA: Diagnosis not present

## 2019-06-27 DIAGNOSIS — E1165 Type 2 diabetes mellitus with hyperglycemia: Secondary | ICD-10-CM | POA: Diagnosis not present

## 2019-06-27 DIAGNOSIS — H59031 Cystoid macular edema following cataract surgery, right eye: Secondary | ICD-10-CM | POA: Diagnosis not present

## 2019-06-27 DIAGNOSIS — I10 Essential (primary) hypertension: Secondary | ICD-10-CM | POA: Diagnosis not present

## 2019-07-05 DIAGNOSIS — E1121 Type 2 diabetes mellitus with diabetic nephropathy: Secondary | ICD-10-CM | POA: Diagnosis not present

## 2019-07-05 DIAGNOSIS — E782 Mixed hyperlipidemia: Secondary | ICD-10-CM | POA: Diagnosis not present

## 2019-07-05 DIAGNOSIS — E1165 Type 2 diabetes mellitus with hyperglycemia: Secondary | ICD-10-CM | POA: Diagnosis not present

## 2019-07-05 DIAGNOSIS — I10 Essential (primary) hypertension: Secondary | ICD-10-CM | POA: Diagnosis not present

## 2019-07-12 DIAGNOSIS — E782 Mixed hyperlipidemia: Secondary | ICD-10-CM | POA: Diagnosis not present

## 2019-07-12 DIAGNOSIS — Z23 Encounter for immunization: Secondary | ICD-10-CM | POA: Diagnosis not present

## 2019-07-12 DIAGNOSIS — I10 Essential (primary) hypertension: Secondary | ICD-10-CM | POA: Diagnosis not present

## 2019-07-12 DIAGNOSIS — E1165 Type 2 diabetes mellitus with hyperglycemia: Secondary | ICD-10-CM | POA: Diagnosis not present

## 2019-07-12 DIAGNOSIS — E1121 Type 2 diabetes mellitus with diabetic nephropathy: Secondary | ICD-10-CM | POA: Diagnosis not present

## 2019-07-12 DIAGNOSIS — Z7189 Other specified counseling: Secondary | ICD-10-CM | POA: Diagnosis not present

## 2019-07-29 ENCOUNTER — Encounter: Payer: Self-pay | Admitting: Podiatry

## 2019-07-29 ENCOUNTER — Other Ambulatory Visit: Payer: Self-pay

## 2019-07-29 ENCOUNTER — Ambulatory Visit (INDEPENDENT_AMBULATORY_CARE_PROVIDER_SITE_OTHER): Payer: Medicare Other | Admitting: Podiatry

## 2019-07-29 DIAGNOSIS — L84 Corns and callosities: Secondary | ICD-10-CM | POA: Diagnosis not present

## 2019-07-29 DIAGNOSIS — M79676 Pain in unspecified toe(s): Secondary | ICD-10-CM | POA: Diagnosis not present

## 2019-07-29 DIAGNOSIS — Z794 Long term (current) use of insulin: Secondary | ICD-10-CM | POA: Diagnosis not present

## 2019-07-29 DIAGNOSIS — B351 Tinea unguium: Secondary | ICD-10-CM | POA: Diagnosis not present

## 2019-07-29 DIAGNOSIS — E119 Type 2 diabetes mellitus without complications: Secondary | ICD-10-CM

## 2019-07-29 NOTE — Patient Instructions (Signed)
Diabetes Mellitus and Foot Care Foot care is an important part of your health, especially when you have diabetes. Diabetes may cause you to have problems because of poor blood flow (circulation) to your feet and legs, which can cause your skin to:  Become thinner and drier.  Break more easily.  Heal more slowly.  Peel and crack. You may also have nerve damage (neuropathy) in your legs and feet, causing decreased feeling in them. This means that you may not notice minor injuries to your feet that could lead to more serious problems. Noticing and addressing any potential problems early is the best way to prevent future foot problems. How to care for your feet Foot hygiene  Wash your feet daily with warm water and mild soap. Do not use hot water. Then, pat your feet and the areas between your toes until they are completely dry. Do not soak your feet as this can dry your skin.  Trim your toenails straight across. Do not dig under them or around the cuticle. File the edges of your nails with an emery board or nail file.  Apply a moisturizing lotion or petroleum jelly to the skin on your feet and to dry, brittle toenails. Use lotion that does not contain alcohol and is unscented. Do not apply lotion between your toes. Shoes and socks  Wear clean socks or stockings every day. Make sure they are not too tight. Do not wear knee-high stockings since they may decrease blood flow to your legs.  Wear shoes that fit properly and have enough cushioning. Always look in your shoes before you put them on to be sure there are no objects inside.  To break in new shoes, wear them for just a few hours a day. This prevents injuries on your feet. Wounds, scrapes, corns, and calluses  Check your feet daily for blisters, cuts, bruises, sores, and redness. If you cannot see the bottom of your feet, use a mirror or ask someone for help.  Do not cut corns or calluses or try to remove them with medicine.  If you  find a minor scrape, cut, or break in the skin on your feet, keep it and the skin around it clean and dry. You may clean these areas with mild soap and water. Do not clean the area with peroxide, alcohol, or iodine.  If you have a wound, scrape, corn, or callus on your foot, look at it several times a day to make sure it is healing and not infected. Check for: ? Redness, swelling, or pain. ? Fluid or blood. ? Warmth. ? Pus or a bad smell. General instructions  Do not cross your legs. This may decrease blood flow to your feet.  Do not use heating pads or hot water bottles on your feet. They may burn your skin. If you have lost feeling in your feet or legs, you may not know this is happening until it is too late.  Protect your feet from hot and cold by wearing shoes, such as at the beach or on hot pavement.  Schedule a complete foot exam at least once a year (annually) or more often if you have foot problems. If you have foot problems, report any cuts, sores, or bruises to your health care provider immediately. Contact a health care provider if:  You have a medical condition that increases your risk of infection and you have any cuts, sores, or bruises on your feet.  You have an injury that is not   healing.  You have redness on your legs or feet.  You feel burning or tingling in your legs or feet.  You have pain or cramps in your legs and feet.  Your legs or feet are numb.  Your feet always feel cold.  You have pain around a toenail. Get help right away if:  You have a wound, scrape, corn, or callus on your foot and: ? You have pain, swelling, or redness that gets worse. ? You have fluid or blood coming from the wound, scrape, corn, or callus. ? Your wound, scrape, corn, or callus feels warm to the touch. ? You have pus or a bad smell coming from the wound, scrape, corn, or callus. ? You have a fever. ? You have a red line going up your leg. Summary  Check your feet every day  for cuts, sores, red spots, swelling, and blisters.  Moisturize feet and legs daily.  Wear shoes that fit properly and have enough cushioning.  If you have foot problems, report any cuts, sores, or bruises to your health care provider immediately.  Schedule a complete foot exam at least once a year (annually) or more often if you have foot problems. This information is not intended to replace advice given to you by your health care provider. Make sure you discuss any questions you have with your health care provider. Document Released: 10/28/2000 Document Revised: 12/13/2017 Document Reviewed: 12/02/2016 Elsevier Patient Education  2020 Elsevier Inc.   Onychomycosis/Fungal Toenails  WHAT IS IT? An infection that lies within the keratin of your nail plate that is caused by a fungus.  WHY ME? Fungal infections affect all ages, sexes, races, and creeds.  There may be many factors that predispose you to a fungal infection such as age, coexisting medical conditions such as diabetes, or an autoimmune disease; stress, medications, fatigue, genetics, etc.  Bottom line: fungus thrives in a warm, moist environment and your shoes offer such a location.  IS IT CONTAGIOUS? Theoretically, yes.  You do not want to share shoes, nail clippers or files with someone who has fungal toenails.  Walking around barefoot in the same room or sleeping in the same bed is unlikely to transfer the organism.  It is important to realize, however, that fungus can spread easily from one nail to the next on the same foot.  HOW DO WE TREAT THIS?  There are several ways to treat this condition.  Treatment may depend on many factors such as age, medications, pregnancy, liver and kidney conditions, etc.  It is best to ask your doctor which options are available to you.  1. No treatment.   Unlike many other medical concerns, you can live with this condition.  However for many people this can be a painful condition and may lead to  ingrown toenails or a bacterial infection.  It is recommended that you keep the nails cut short to help reduce the amount of fungal nail. 2. Topical treatment.  These range from herbal remedies to prescription strength nail lacquers.  About 40-50% effective, topicals require twice daily application for approximately 9 to 12 months or until an entirely new nail has grown out.  The most effective topicals are medical grade medications available through physicians offices. 3. Oral antifungal medications.  With an 80-90% cure rate, the most common oral medication requires 3 to 4 months of therapy and stays in your system for a year as the new nail grows out.  Oral antifungal medications do require   blood work to make sure it is a safe drug for you.  A liver function panel will be performed prior to starting the medication and after the first month of treatment.  It is important to have the blood work performed to avoid any harmful side effects.  In general, this medication safe but blood work is required. 4. Laser Therapy.  This treatment is performed by applying a specialized laser to the affected nail plate.  This therapy is noninvasive, fast, and non-painful.  It is not covered by insurance and is therefore, out of pocket.  The results have been very good with a 80-95% cure rate.  [facility] is the only practice in the area to offer this therapy. 5. Permanent Nail Avulsion.  Removing the entire nail so that a new nail will not grow back. 

## 2019-07-29 NOTE — Progress Notes (Signed)
Subjective: Bonnie [name] is a 63 y.o. y.o. female who presents for diabetic foot care on today with cc of painful, discolored, thick toenails b/l 5th digits and calluses b/l feet which interfere with daily activities. Pain is aggravated when wearing enclosed shoe gear and relieved with periodic professional debridement.  [name], MD is her PCP.   Patient voices no new pedal concerns on today. She is preparing to take care of her husband who is having surgery for cancer soon.  She states she used the cream for her feet as instructed and thinks her feet look better.  Past Medical History:  Diagnosis Date  . Anemia   . Angina   . Anxiety   . Carpal tunnel syndrome 12/07/2015   Bilateral  . Diabetes mellitus   . GERD (gastroesophageal reflux disease)   . Hypercholesteremia   . Obesity     Allergies  Allergen Reactions  . Clarithromycin Swelling, Other (See Comments), Anxiety and Shortness Of Breath    Throat swelling Other Reaction: laryngeal edema Throat swelling  Throat swelling Other Reaction: laryngeal edema Other reaction(s): Other (See Comments) Throat swelling Other Reaction: laryngeal edema Throat swelling   . Exenatide Rash, Other (See Comments) and Anaphylaxis    Caused Sweating, sores in the mouth  Also gum soreness/irritation Caused Sweating, sores in the mouth  Caused Sweating, sores in the mouth  Caused Sweating, sores in the mouth  Also gum soreness/irritation   . Ibuprofen Rash and Other (See Comments)    Nose bleed  . Methocarbamol Anaphylaxis    Other reaction(s): Other (See Comments) Swollen eyes Swollen eyes  . Shellfish Allergy Other (See Comments)    Nausea and vomitting  . Ampicillin Nausea And Vomiting  . Carafate [Sucralfate] Nausea And Vomiting  . Cyanocobalamin   . Metronidazole Nausea And Vomiting and Nausea Only    Pt stated, "Eyes swelled up" Pt stated, "Eyes swelled up" Pt stated, "Eyes swelled up"  . Vitamin B12 Swelling   . Ketoconazole Palpitations and Nausea And Vomiting    Other reaction(s): Irregular Heart Rate Other reaction(s): Irregular Heart Rate   . Mango Flavor Itching and Rash  . Pineapple Rash  . Strawberry Flavor Itching    Objective: There were no vitals filed for this visit.  Vascular Examination: Capillary refill time immediate x 10 digits.  Dorsalis pedis pulses 2/4 b/l.  Posterior tibial pulses 1/4 b/l.  Digital hair sparse x 10 digits.  Skin temperature gradient WNL b/l.  Dermatological Examination: Skin with normal turgor, texture and tone b/l.  Toenails 5th digits b/l discolored, thick, dystrophic with subungual debris and pain with palpation to nailbeds due to thickness of nails.  Hyperkeratotic lesions submet head 1, 5 b/l. No erythema, no edema, no drainage, no flocculence noted.   Tinea pedis resolved b/l.  Musculoskeletal: Muscle strength 5/5 to all LE muscle groups b/l.  Neurological: Sensation intact 5/5 b/l with 10 gram monofilament.  Vibratory sensation intact b/l.  Assessment: Painful onychomycosis toenails 5th digits b/l 2.  Calluses submet head 1, 5 b/l 3.  Tinea pedis resolved b/l 4.  NIDDM  Plan: 1. Continue diabetic foot care principles. Literature dispensed on today. 2. Toenails 5th digits b/l were debrided in length and girth without iatrogenic bleeding. 3. Hyperkeratotic lesion(s) submet head 1, 5 b/l  pared with sterile scalpel blade without incident. 4. Discontinue Ciclopirox Cream. Tinea pedis is resolved. 5. Patient to continue soft, supportive shoe gear daily. 6. Patient to report any pedal injuries to medical  professional immediately. 7. Follow up 3 months.  8. Patient/POA to call should there be a concern in the interim.

## 2019-08-22 DIAGNOSIS — E113212 Type 2 diabetes mellitus with mild nonproliferative diabetic retinopathy with macular edema, left eye: Secondary | ICD-10-CM | POA: Diagnosis not present

## 2019-08-22 DIAGNOSIS — E113291 Type 2 diabetes mellitus with mild nonproliferative diabetic retinopathy without macular edema, right eye: Secondary | ICD-10-CM | POA: Diagnosis not present

## 2019-08-22 DIAGNOSIS — Z794 Long term (current) use of insulin: Secondary | ICD-10-CM | POA: Diagnosis not present

## 2019-09-24 ENCOUNTER — Other Ambulatory Visit: Payer: Self-pay | Admitting: Podiatry

## 2019-09-26 ENCOUNTER — Telehealth: Payer: Self-pay | Admitting: Podiatry

## 2019-09-26 NOTE — Telephone Encounter (Signed)
Pt called requesting a refill of ciclopirox medicated cream. Pharmacy is [facility] on [facility]

## 2019-09-30 NOTE — Progress Notes (Signed)
Left patient voicemail today about scheduling a phone counseling appointment. She is encouraged to reach out to me by phone or email for scheduling.    Verlan Friends [place] Counseling Intern Voicemail: [phone]

## 2019-10-07 ENCOUNTER — Encounter: Payer: Self-pay | Admitting: Podiatry

## 2019-10-07 ENCOUNTER — Ambulatory Visit: Payer: Medicare Other | Admitting: Podiatry

## 2019-10-07 ENCOUNTER — Other Ambulatory Visit: Payer: Self-pay | Admitting: Podiatry

## 2019-10-07 ENCOUNTER — Other Ambulatory Visit: Payer: Self-pay

## 2019-10-07 ENCOUNTER — Ambulatory Visit (INDEPENDENT_AMBULATORY_CARE_PROVIDER_SITE_OTHER): Payer: Medicare Other

## 2019-10-07 DIAGNOSIS — M778 Other enthesopathies, not elsewhere classified: Secondary | ICD-10-CM | POA: Diagnosis not present

## 2019-10-07 DIAGNOSIS — M779 Enthesopathy, unspecified: Secondary | ICD-10-CM | POA: Diagnosis not present

## 2019-10-07 DIAGNOSIS — M79674 Pain in right toe(s): Secondary | ICD-10-CM

## 2019-10-08 NOTE — Progress Notes (Signed)
Subjective:   Patient ID: Bonnie [name], female   DOB: 63 y.o.   MRN: [id number]   HPI Patient presents with quite a bit of discomfort in the inner phalangeal joint of the right big toe stating she does not remember specific injury   ROS      Objective:  Physical Exam  Neurovascular status intact with inflammation pain of the inner phalangeal joint right hallux     Assessment:  Inflammatory capsulitis inner phalangeal joint right big toe     Plan:  H&P sterile anesthesia applied to the area 60 mg like Marcaine mixture sterile prep applied and injected the inner phalangeal joint 3 mg Dexasone Kenalog 5 mg Xylocaine and advised on reduced activity and if symptoms persist will be seen back  X-rays were negative for signs of fracture or arthritic process around the inner phalangeal joint

## 2019-10-13 NOTE — Telephone Encounter (Signed)
Prescription sent to pharmacy for Amlactin  Lotion with 2 refills.

## 2019-10-23 DIAGNOSIS — I10 Essential (primary) hypertension: Secondary | ICD-10-CM | POA: Diagnosis not present

## 2019-10-23 DIAGNOSIS — E782 Mixed hyperlipidemia: Secondary | ICD-10-CM | POA: Diagnosis not present

## 2019-10-23 DIAGNOSIS — E1121 Type 2 diabetes mellitus with diabetic nephropathy: Secondary | ICD-10-CM | POA: Diagnosis not present

## 2019-10-23 DIAGNOSIS — E1165 Type 2 diabetes mellitus with hyperglycemia: Secondary | ICD-10-CM | POA: Diagnosis not present

## 2019-10-23 DIAGNOSIS — Z0000 Encounter for general adult medical examination without abnormal findings: Secondary | ICD-10-CM | POA: Diagnosis not present

## 2019-10-24 DIAGNOSIS — Z794 Long term (current) use of insulin: Secondary | ICD-10-CM | POA: Diagnosis not present

## 2019-10-24 DIAGNOSIS — E113212 Type 2 diabetes mellitus with mild nonproliferative diabetic retinopathy with macular edema, left eye: Secondary | ICD-10-CM | POA: Diagnosis not present

## 2019-10-28 ENCOUNTER — Encounter: Payer: Self-pay | Admitting: Podiatry

## 2019-10-28 ENCOUNTER — Ambulatory Visit: Payer: Medicare Other | Admitting: Podiatry

## 2019-10-28 ENCOUNTER — Other Ambulatory Visit: Payer: Self-pay

## 2019-10-28 DIAGNOSIS — Z794 Long term (current) use of insulin: Secondary | ICD-10-CM | POA: Diagnosis not present

## 2019-10-28 DIAGNOSIS — L84 Corns and callosities: Secondary | ICD-10-CM

## 2019-10-28 DIAGNOSIS — E119 Type 2 diabetes mellitus without complications: Secondary | ICD-10-CM | POA: Diagnosis not present

## 2019-10-28 DIAGNOSIS — B351 Tinea unguium: Secondary | ICD-10-CM

## 2019-10-28 DIAGNOSIS — M79675 Pain in left toe(s): Secondary | ICD-10-CM | POA: Diagnosis not present

## 2019-10-28 DIAGNOSIS — M79674 Pain in right toe(s): Secondary | ICD-10-CM

## 2019-10-28 NOTE — Patient Instructions (Signed)
Diabetes Mellitus and Foot Care Foot care is an important part of your health, especially when you have diabetes. Diabetes may cause you to have problems because of poor blood flow (circulation) to your feet and legs, which can cause your skin to:  Become thinner and drier.  Break more easily.  Heal more slowly.  Peel and crack. You may also have nerve damage (neuropathy) in your legs and feet, causing decreased feeling in them. This means that you may not notice minor injuries to your feet that could lead to more serious problems. Noticing and addressing any potential problems early is the best way to prevent future foot problems. How to care for your feet Foot hygiene  Wash your feet daily with warm water and mild soap. Do not use hot water. Then, pat your feet and the areas between your toes until they are completely dry. Do not soak your feet as this can dry your skin.  Trim your toenails straight across. Do not dig under them or around the cuticle. File the edges of your nails with an emery board or nail file.  Apply a moisturizing lotion or petroleum jelly to the skin on your feet and to dry, brittle toenails. Use lotion that does not contain alcohol and is unscented. Do not apply lotion between your toes. Shoes and socks  Wear clean socks or stockings every day. Make sure they are not too tight. Do not wear knee-high stockings since they may decrease blood flow to your legs.  Wear shoes that fit properly and have enough cushioning. Always look in your shoes before you put them on to be sure there are no objects inside.  To break in new shoes, wear them for just a few hours a day. This prevents injuries on your feet. Wounds, scrapes, corns, and calluses  Check your feet daily for blisters, cuts, bruises, sores, and redness. If you cannot see the bottom of your feet, use a mirror or ask someone for help.  Do not cut corns or calluses or try to remove them with medicine.  If you  find a minor scrape, cut, or break in the skin on your feet, keep it and the skin around it clean and dry. You may clean these areas with mild soap and water. Do not clean the area with peroxide, alcohol, or iodine.  If you have a wound, scrape, corn, or callus on your foot, look at it several times a day to make sure it is healing and not infected. Check for: ? Redness, swelling, or pain. ? Fluid or blood. ? Warmth. ? Pus or a bad smell. General instructions  Do not cross your legs. This may decrease blood flow to your feet.  Do not use heating pads or hot water bottles on your feet. They may burn your skin. If you have lost feeling in your feet or legs, you may not know this is happening until it is too late.  Protect your feet from hot and cold by wearing shoes, such as at the beach or on hot pavement.  Schedule a complete foot exam at least once a year (annually) or more often if you have foot problems. If you have foot problems, report any cuts, sores, or bruises to your health care provider immediately. Contact a health care provider if:  You have a medical condition that increases your risk of infection and you have any cuts, sores, or bruises on your feet.  You have an injury that is not   healing.  You have redness on your legs or feet.  You feel burning or tingling in your legs or feet.  You have pain or cramps in your legs and feet.  Your legs or feet are numb.  Your feet always feel cold.  You have pain around a toenail. Get help right away if:  You have a wound, scrape, corn, or callus on your foot and: ? You have pain, swelling, or redness that gets worse. ? You have fluid or blood coming from the wound, scrape, corn, or callus. ? Your wound, scrape, corn, or callus feels warm to the touch. ? You have pus or a bad smell coming from the wound, scrape, corn, or callus. ? You have a fever. ? You have a red line going up your leg. Summary  Check your feet every day  for cuts, sores, red spots, swelling, and blisters.  Moisturize feet and legs daily.  Wear shoes that fit properly and have enough cushioning.  If you have foot problems, report any cuts, sores, or bruises to your health care provider immediately.  Schedule a complete foot exam at least once a year (annually) or more often if you have foot problems. This information is not intended to replace advice given to you by your health care provider. Make sure you discuss any questions you have with your health care provider. Document Released: 10/28/2000 Document Revised: 12/13/2017 Document Reviewed: 12/02/2016 Elsevier Patient Education  2020 Elsevier Inc.  

## 2019-10-30 DIAGNOSIS — E118 Type 2 diabetes mellitus with unspecified complications: Secondary | ICD-10-CM | POA: Diagnosis not present

## 2019-10-30 DIAGNOSIS — E1121 Type 2 diabetes mellitus with diabetic nephropathy: Secondary | ICD-10-CM | POA: Diagnosis not present

## 2019-10-30 DIAGNOSIS — Z0000 Encounter for general adult medical examination without abnormal findings: Secondary | ICD-10-CM | POA: Diagnosis not present

## 2019-10-30 DIAGNOSIS — E782 Mixed hyperlipidemia: Secondary | ICD-10-CM | POA: Diagnosis not present

## 2019-10-30 DIAGNOSIS — E1165 Type 2 diabetes mellitus with hyperglycemia: Secondary | ICD-10-CM | POA: Diagnosis not present

## 2019-11-03 NOTE — Progress Notes (Signed)
Subjective: Bonnie [name] is a 63 y.o. y.o. female who presents today with cc of painful, discolored, thick toenails and painful calluses which interfere with daily activities. Pain is aggravated when wearing enclosed shoe gear and relieved with periodic professional debridement.  Medications reviewed in chart.  Allergies  Allergen Reactions  . Clarithromycin Swelling, Other (See Comments), Anxiety and Shortness Of Breath    Throat swelling Other Reaction: laryngeal edema Throat swelling  Throat swelling Other Reaction: laryngeal edema Other reaction(s): Other (See Comments) Throat swelling Other Reaction: laryngeal edema Throat swelling   . Exenatide Rash, Other (See Comments) and Anaphylaxis    Caused Sweating, sores in the mouth  Also gum soreness/irritation Caused Sweating, sores in the mouth  Caused Sweating, sores in the mouth  Caused Sweating, sores in the mouth  Also gum soreness/irritation   . Ibuprofen Rash and Other (See Comments)    Nose bleed  . Methocarbamol Anaphylaxis    Other reaction(s): Other (See Comments) Swollen eyes Swollen eyes  . Shellfish Allergy Other (See Comments)    Nausea and vomitting  . Ampicillin Nausea And Vomiting  . Carafate [Sucralfate] Nausea And Vomiting  . Cyanocobalamin   . Metronidazole Nausea And Vomiting and Nausea Only    Pt stated, "Eyes swelled up" Pt stated, "Eyes swelled up" Pt stated, "Eyes swelled up"  . Vitamin B12 Swelling  . Ketoconazole Palpitations and Nausea And Vomiting    Other reaction(s): Irregular Heart Rate Other reaction(s): Irregular Heart Rate   . Mango Flavor Itching and Rash  . Pineapple Rash  . Strawberry Flavor Itching   Objective: There were no vitals filed for this visit.  Vascular Examination: Capillary refill time immediate b/l.  Dorsalis pedis pulses palpable b/l.  Posterior tibial pulses faintly palpable b/l.  Digital hair sparse b/l.  Skin temperature gradient WNL  b/l.  Dermatological Examination: Skin with normal turgor, texture and tone b/l.  Toenails b/l 5th digits discolored, thick, dystrophic with subungual debris and pain with palpation to nailbeds due to thickness of nails.   Hyperkeratotic lesion submet head 1, 5 b/l with tenderness to palpation. No edema, no erythema, no drainage, no flocculence.  Musculoskeletal: Muscle strength 5/5 to all LE muscle groups b/l.  Neurological: Sensation intact with 10 gram monofilament b/l.  Assessment: 1. Painful onychomycosis toenails 1-5 b/l 2.  Calluses submet head 1, 5 b/l 3.  NIDDM  Plan: 1. Continue diabetic foot care principles. Literature dispensed on today. 2. Toenails 5th digit b/l were debrided in length and girth without iatrogenic bleeding. 3. Calluses pared submetatarsal heads 1, 5 b/l utilizing sterile scalpel blade without incident.  4. Patient to continue soft, supportive shoe gear daily. 5. Patient to report any pedal injuries to medical professional immediately. 6. Follow up 3 months.  7. Patient/POA to call should there be a concern in the interim.

## 2019-11-27 DIAGNOSIS — M545 Low back pain: Secondary | ICD-10-CM | POA: Diagnosis not present

## 2019-11-27 DIAGNOSIS — M259 Joint disorder, unspecified: Secondary | ICD-10-CM | POA: Diagnosis not present

## 2019-11-27 DIAGNOSIS — Z981 Arthrodesis status: Secondary | ICD-10-CM | POA: Insufficient documentation

## 2019-11-29 NOTE — Progress Notes (Signed)
Spoke with patient on 11/28/19 for a counseling session via phone. Pt reported experiencing an emotional "breakdown" on Sunday because she was exhausted and overwhelmed by the constant requirements of managing her husband's care. She stated that he is now proactively helping, but only after a family friend intervened. Counseling intern and pt discussed the importance of taking care of herself mentally and physically so that she is able to help care for her husband. Pt agreed to try out slowing down her breath when she is feeling overwhelmed, upset, or anxious and mentally repeating to herself the statement "this too shall pass". Pt also agreed to work on speaking up for herself by being honest and being specific about how she is feeling emotionally and physically when asking for assistance.  Next Counseling Appt: Thursday, December 05, 2019 at 4:00pm via phone  Art [facility] [place] [facility] Counseling Intern Voicemail:  [phone]

## 2019-12-09 DIAGNOSIS — M961 Postlaminectomy syndrome, not elsewhere classified: Secondary | ICD-10-CM | POA: Diagnosis not present

## 2019-12-09 NOTE — Progress Notes (Signed)
Left patient voicemail on 12/09/19 to schedule a counseling call. Patient encouraged to reach out to me by phone to let me know of a good time to reach her this week.  Verlan Friends [place] Counseling Intern Voicemail: [phone]

## 2019-12-10 DIAGNOSIS — L738 Other specified follicular disorders: Secondary | ICD-10-CM | POA: Diagnosis not present

## 2019-12-10 DIAGNOSIS — L816 Other disorders of diminished melanin formation: Secondary | ICD-10-CM | POA: Diagnosis not present

## 2020-01-13 ENCOUNTER — Ambulatory Visit: Payer: Medicare Other | Attending: Internal Medicine

## 2020-01-13 DIAGNOSIS — Z23 Encounter for immunization: Secondary | ICD-10-CM | POA: Insufficient documentation

## 2020-01-13 NOTE — Progress Notes (Signed)
   Covid-19 Vaccination Clinic  Name:  Bonnie [name]    MRN: [id number] DOB: 07/14/1956  01/13/2020  [name] was observed post Covid-19 immunization for 15 minutes without incidence. She was provided with Vaccine Information Sheet and instruction to access the V-Safe system.   [name] was instructed to call 911 with any severe reactions post vaccine: Marland Kitchen Difficulty breathing  . Swelling of your face and throat  . A fast heartbeat  . A bad rash all over your body  . Dizziness and weakness    Immunizations Administered    Name Date Dose VIS Date Route   Pfizer COVID-19 Vaccine 01/13/2020  9:57 AM 0.3 mL 10/25/2019 Intramuscular   Manufacturer: [place]   Lot: [id number]   [place]: [id number]

## 2020-01-21 DIAGNOSIS — M549 Dorsalgia, unspecified: Secondary | ICD-10-CM | POA: Diagnosis not present

## 2020-01-22 ENCOUNTER — Other Ambulatory Visit: Payer: Self-pay | Admitting: Neurological Surgery

## 2020-01-22 DIAGNOSIS — G8929 Other chronic pain: Secondary | ICD-10-CM

## 2020-01-23 DIAGNOSIS — Z794 Long term (current) use of insulin: Secondary | ICD-10-CM | POA: Diagnosis not present

## 2020-01-23 DIAGNOSIS — E113212 Type 2 diabetes mellitus with mild nonproliferative diabetic retinopathy with macular edema, left eye: Secondary | ICD-10-CM | POA: Diagnosis not present

## 2020-01-23 DIAGNOSIS — E113291 Type 2 diabetes mellitus with mild nonproliferative diabetic retinopathy without macular edema, right eye: Secondary | ICD-10-CM | POA: Diagnosis not present

## 2020-01-23 DIAGNOSIS — H401131 Primary open-angle glaucoma, bilateral, mild stage: Secondary | ICD-10-CM | POA: Diagnosis not present

## 2020-01-27 ENCOUNTER — Encounter: Payer: Self-pay | Admitting: Podiatry

## 2020-01-27 ENCOUNTER — Other Ambulatory Visit: Payer: Self-pay

## 2020-01-27 ENCOUNTER — Ambulatory Visit (INDEPENDENT_AMBULATORY_CARE_PROVIDER_SITE_OTHER): Payer: Medicare Other | Admitting: Podiatry

## 2020-01-27 VITALS — Temp 97.0°F

## 2020-01-27 DIAGNOSIS — E119 Type 2 diabetes mellitus without complications: Secondary | ICD-10-CM

## 2020-01-27 DIAGNOSIS — Z794 Long term (current) use of insulin: Secondary | ICD-10-CM | POA: Diagnosis not present

## 2020-01-27 DIAGNOSIS — L853 Xerosis cutis: Secondary | ICD-10-CM | POA: Diagnosis not present

## 2020-01-27 DIAGNOSIS — L84 Corns and callosities: Secondary | ICD-10-CM | POA: Diagnosis not present

## 2020-01-27 MED ORDER — AMMONIUM LACTATE 12 % EX LOTN: 1.0000 "application " | TOPICAL_LOTION | Freq: Two times a day (BID) | CUTANEOUS | 5 refills | Status: DC

## 2020-01-27 NOTE — Patient Instructions (Addendum)
Bring in your shoes for your daughter's wedding for Dr. Elisha Ponder to see.    Diabetes Mellitus and Foot Care Foot care is an important part of your health, especially when you have diabetes. Diabetes may cause you to have problems because of poor blood flow (circulation) to your feet and legs, which can cause your skin to:  Become thinner and drier.  Break more easily.  Heal more slowly.  Peel and crack. You may also have nerve damage (neuropathy) in your legs and feet, causing decreased feeling in them. This means that you may not notice minor injuries to your feet that could lead to more serious problems. Noticing and addressing any potential problems early is the best way to prevent future foot problems. How to care for your feet Foot hygiene  Wash your feet daily with warm water and mild soap. Do not use hot water. Then, pat your feet and the areas between your toes until they are completely dry. Do not soak your feet as this can dry your skin.  Trim your toenails straight across. Do not dig under them or around the cuticle. File the edges of your nails with an emery board or nail file.  Apply a moisturizing lotion or petroleum jelly to the skin on your feet and to dry, brittle toenails. Use lotion that does not contain alcohol and is unscented. Do not apply lotion between your toes. Shoes and socks  Wear clean socks or stockings every day. Make sure they are not too tight. Do not wear knee-high stockings since they may decrease blood flow to your legs.  Wear shoes that fit properly and have enough cushioning. Always look in your shoes before you put them on to be sure there are no objects inside.  To break in new shoes, wear them for just a few hours a day. This prevents injuries on your feet. Wounds, scrapes, corns, and calluses  Check your feet daily for blisters, cuts, bruises, sores, and redness. If you cannot see the bottom of your feet, use a mirror or ask someone for  help.  Do not cut corns or calluses or try to remove them with medicine.  If you find a minor scrape, cut, or break in the skin on your feet, keep it and the skin around it clean and dry. You may clean these areas with mild soap and water. Do not clean the area with peroxide, alcohol, or iodine.  If you have a wound, scrape, corn, or callus on your foot, look at it several times a day to make sure it is healing and not infected. Check for: ? Redness, swelling, or pain. ? Fluid or blood. ? Warmth. ? Pus or a bad smell. General instructions  Do not cross your legs. This may decrease blood flow to your feet.  Do not use heating pads or hot water bottles on your feet. They may burn your skin. If you have lost feeling in your feet or legs, you may not know this is happening until it is too late.  Protect your feet from hot and cold by wearing shoes, such as at the beach or on hot pavement.  Schedule a complete foot exam at least once a year (annually) or more often if you have foot problems. If you have foot problems, report any cuts, sores, or bruises to your health care provider immediately. Contact a health care provider if:  You have a medical condition that increases your risk of infection and you have  any cuts, sores, or bruises on your feet.  You have an injury that is not healing.  You have redness on your legs or feet.  You feel burning or tingling in your legs or feet.  You have pain or cramps in your legs and feet.  Your legs or feet are numb.  Your feet always feel cold.  You have pain around a toenail. Get help right away if:  You have a wound, scrape, corn, or callus on your foot and: ? You have pain, swelling, or redness that gets worse. ? You have fluid or blood coming from the wound, scrape, corn, or callus. ? Your wound, scrape, corn, or callus feels warm to the touch. ? You have pus or a bad smell coming from the wound, scrape, corn, or callus. ? You have a  fever. ? You have a red line going up your leg. Summary  Check your feet every day for cuts, sores, red spots, swelling, and blisters.  Moisturize feet and legs daily.  Wear shoes that fit properly and have enough cushioning.  If you have foot problems, report any cuts, sores, or bruises to your health care provider immediately.  Schedule a complete foot exam at least once a year (annually) or more often if you have foot problems. This information is not intended to replace advice given to you by your health care provider. Make sure you discuss any questions you have with your health care provider. Document Revised: 07/24/2019 Document Reviewed: 12/02/2016 Elsevier Patient Education  [place].

## 2020-02-02 NOTE — Progress Notes (Signed)
Subjective: Bonnie [name] presents today for follow up of preventative diabetic foot care and callus(es) plantar aspect of b/l feet. Aggravating factors include weightbearing with and without shoe gear. Pain is relieved with periodic professional debridement.  Allergies  Allergen Reactions  . Clarithromycin Swelling, Other (See Comments), Anxiety and Shortness Of Breath    Throat swelling Other Reaction: laryngeal edema Throat swelling  Throat swelling Other Reaction: laryngeal edema Other reaction(s): Other (See Comments) Throat swelling Other Reaction: laryngeal edema Throat swelling   . Exenatide Rash, Other (See Comments) and Anaphylaxis    Caused Sweating, sores in the mouth  Also gum soreness/irritation Caused Sweating, sores in the mouth  Caused Sweating, sores in the mouth  Caused Sweating, sores in the mouth  Also gum soreness/irritation   . Ibuprofen Rash and Other (See Comments)    Nose bleed  . Methocarbamol Anaphylaxis    Other reaction(s): Other (See Comments) Swollen eyes Swollen eyes  . Shellfish Allergy Other (See Comments)    Nausea and vomitting  . Ampicillin Nausea And Vomiting  . Carafate [Sucralfate] Nausea And Vomiting  . Cyanocobalamin   . Metronidazole Nausea And Vomiting and Nausea Only    Pt stated, "Eyes swelled up" Pt stated, "Eyes swelled up" Pt stated, "Eyes swelled up"  . Vitamin B12 Swelling  . Ketoconazole Palpitations and Nausea And Vomiting    Other reaction(s): Irregular Heart Rate Other reaction(s): Irregular Heart Rate   . Mango Flavor Itching and Rash  . Pineapple Rash  . Strawberry Flavor Itching     Objective: Vitals:   01/27/20 1109  Temp: (!) 51 F (36.1 C)    Pt 64 y.o. year old female  in NAD. AAO x 3.   Vascular Examination:  Capillary refill time to digits immediate b/l. Palpable DP pulses b/l. Palpable PT pulses b/l. Pedal hair sparse b/l. Skin temperature gradient within normal limits b/l.  Dermatological  Examination: Pedal skin with normal turgor, texture and tone bilaterally. No open wounds bilaterally. No interdigital macerations bilaterally. Hyperkeratotic lesion(s) submet head 1, 5 b/l.  No erythema, no edema, no drainage, no flocculence. Pedal skin noted to be dry and flaky b/l feet.  Musculoskeletal: Normal muscle strength 5/5 to all lower extremity muscle groups bilaterally, no gross bony deformities bilaterally and no pain crepitus or joint limitation noted with ROM b/l  Neurological: Protective sensation intact 5/5 intact bilaterally with 10g monofilament b/l Vibratory sensation intact b/l Proprioception intact bilaterally  Assessment: 1. Callus   2. Xerosis cutis   3. Type 2 diabetes mellitus without complication, with Mckissic-term current use of insulin ([place])    Plan: -Continue diabetic foot care principles. Literature dispensed on today.  -Callus(es) submet head 1, 5 b/l were debrided without complication or incident. Total number debrided =4. -Patient to continue soft, supportive shoe gear daily. -Patient to report any pedal injuries to medical professional immediately. -Rx sent to pharmacy for AmLactin Lotion 12% to be applied to feet twice daily for xerosis -Patient/POA to call should there be question/concern in the interim.  Return in about 3 months (around 04/28/2020) for diabetic callus trim.

## 2020-02-04 ENCOUNTER — Ambulatory Visit: Payer: Medicare Other | Attending: Internal Medicine

## 2020-02-04 ENCOUNTER — Ambulatory Visit
Admission: RE | Admit: 2020-02-04 | Discharge: 2020-02-04 | Disposition: A | Discharge status: Home / Self Care | Payer: Medicare Other | Source: Ambulatory Visit | Attending: Neurological Surgery | Admitting: Neurological Surgery

## 2020-02-04 DIAGNOSIS — G8929 Other chronic pain: Secondary | ICD-10-CM

## 2020-02-04 DIAGNOSIS — Z23 Encounter for immunization: Secondary | ICD-10-CM

## 2020-02-04 DIAGNOSIS — M545 Low back pain, unspecified: Secondary | ICD-10-CM

## 2020-02-04 NOTE — Progress Notes (Signed)
   Covid-19 Vaccination Clinic  Name:  Bonnie [name]    MRN: [id number] DOB: 1956-02-25  02/04/2020  [name] was observed post Covid-19 immunization for 30 minutes based on pre-vaccination screening without incident. She was provided with Vaccine Information Sheet and instruction to access the V-Safe system.   [name] was instructed to call 911 with any severe reactions post vaccine: Marland Kitchen Difficulty breathing  . Swelling of face and throat  . A fast heartbeat  . A bad rash all over body  . Dizziness and weakness   Immunizations Administered    Name Date Dose VIS Date Route   Pfizer COVID-19 Vaccine 02/04/2020  9:30 AM 0.3 mL 10/25/2019 Intramuscular   Manufacturer: [place]   Lot: [id number]   [place]: [id number]

## 2020-02-10 DIAGNOSIS — H401131 Primary open-angle glaucoma, bilateral, mild stage: Secondary | ICD-10-CM | POA: Diagnosis not present

## 2020-02-25 DIAGNOSIS — M549 Dorsalgia, unspecified: Secondary | ICD-10-CM | POA: Diagnosis not present

## 2020-03-11 DIAGNOSIS — I10 Essential (primary) hypertension: Secondary | ICD-10-CM | POA: Diagnosis not present

## 2020-03-11 DIAGNOSIS — E118 Type 2 diabetes mellitus with unspecified complications: Secondary | ICD-10-CM | POA: Diagnosis not present

## 2020-03-11 DIAGNOSIS — E782 Mixed hyperlipidemia: Secondary | ICD-10-CM | POA: Diagnosis not present

## 2020-03-11 DIAGNOSIS — E1165 Type 2 diabetes mellitus with hyperglycemia: Secondary | ICD-10-CM | POA: Diagnosis not present

## 2020-03-18 DIAGNOSIS — E1121 Type 2 diabetes mellitus with diabetic nephropathy: Secondary | ICD-10-CM | POA: Diagnosis not present

## 2020-03-18 DIAGNOSIS — E113293 Type 2 diabetes mellitus with mild nonproliferative diabetic retinopathy without macular edema, bilateral: Secondary | ICD-10-CM | POA: Diagnosis not present

## 2020-03-18 DIAGNOSIS — E1165 Type 2 diabetes mellitus with hyperglycemia: Secondary | ICD-10-CM | POA: Diagnosis not present

## 2020-03-18 DIAGNOSIS — I10 Essential (primary) hypertension: Secondary | ICD-10-CM | POA: Diagnosis not present

## 2020-03-18 DIAGNOSIS — E782 Mixed hyperlipidemia: Secondary | ICD-10-CM | POA: Diagnosis not present

## 2020-04-29 ENCOUNTER — Other Ambulatory Visit: Payer: Self-pay

## 2020-04-29 ENCOUNTER — Ambulatory Visit (INDEPENDENT_AMBULATORY_CARE_PROVIDER_SITE_OTHER): Payer: Medicare Other | Admitting: Podiatry

## 2020-04-29 DIAGNOSIS — Z794 Long term (current) use of insulin: Secondary | ICD-10-CM | POA: Diagnosis not present

## 2020-04-29 DIAGNOSIS — M79671 Pain in right foot: Secondary | ICD-10-CM | POA: Diagnosis not present

## 2020-04-29 DIAGNOSIS — B351 Tinea unguium: Secondary | ICD-10-CM | POA: Diagnosis not present

## 2020-04-29 DIAGNOSIS — L84 Corns and callosities: Secondary | ICD-10-CM

## 2020-04-29 DIAGNOSIS — E119 Type 2 diabetes mellitus without complications: Secondary | ICD-10-CM | POA: Diagnosis not present

## 2020-04-29 DIAGNOSIS — M79672 Pain in left foot: Secondary | ICD-10-CM

## 2020-04-29 NOTE — Patient Instructions (Addendum)
Diabetes Mellitus and Foot Care Foot care is an important part of your health, especially when you have diabetes. Diabetes may cause you to have problems because of poor blood flow (circulation) to your feet and legs, which can cause your skin to:  Become thinner and drier.  Break more easily.  Heal more slowly.  Peel and crack. You may also have nerve damage (neuropathy) in your legs and feet, causing decreased feeling in them. This means that you may not notice minor injuries to your feet that could lead to more serious problems. Noticing and addressing any potential problems early is the best way to prevent future foot problems. How to care for your feet Foot hygiene  Wash your feet daily with warm water and mild soap. Do not use hot water. Then, pat your feet and the areas between your toes until they are completely dry. Do not soak your feet as this can dry your skin.  Trim your toenails straight across. Do not dig under them or around the cuticle. File the edges of your nails with an emery board or nail file.  Apply a moisturizing lotion or petroleum jelly to the skin on your feet and to dry, brittle toenails. Use lotion that does not contain alcohol and is unscented. Do not apply lotion between your toes. Shoes and socks  Wear clean socks or stockings every day. Make sure they are not too tight. Do not wear knee-high stockings since they may decrease blood flow to your legs.  Wear shoes that fit properly and have enough cushioning. Always look in your shoes before you put them on to be sure there are no objects inside.  To break in new shoes, wear them for just a few hours a day. This prevents injuries on your feet. Wounds, scrapes, corns, and calluses  Check your feet daily for blisters, cuts, bruises, sores, and redness. If you cannot see the bottom of your feet, use a mirror or ask someone for help.  Do not cut corns or calluses or try to remove them with medicine.  If you  find a minor scrape, cut, or break in the skin on your feet, keep it and the skin around it clean and dry. You may clean these areas with mild soap and water. Do not clean the area with peroxide, alcohol, or iodine.  If you have a wound, scrape, corn, or callus on your foot, look at it several times a day to make sure it is healing and not infected. Check for: ? Redness, swelling, or pain. ? Fluid or blood. ? Warmth. ? Pus or a bad smell. General instructions  Do not cross your legs. This may decrease blood flow to your feet.  Do not use heating pads or hot water bottles on your feet. They may burn your skin. If you have lost feeling in your feet or legs, you may not know this is happening until it is too late.  Protect your feet from hot and cold by wearing shoes, such as at the beach or on hot pavement.  Schedule a complete foot exam at least once a year (annually) or more often if you have foot problems. If you have foot problems, report any cuts, sores, or bruises to your health care provider immediately. Contact a health care provider if:  You have a medical condition that increases your risk of infection and you have any cuts, sores, or bruises on your feet.  You have an injury that is not   healing.  You have redness on your legs or feet.  You feel burning or tingling in your legs or feet.  You have pain or cramps in your legs and feet.  Your legs or feet are numb.  Your feet always feel cold.  You have pain around a toenail. Get help right away if:  You have a wound, scrape, corn, or callus on your foot and: ? You have pain, swelling, or redness that gets worse. ? You have fluid or blood coming from the wound, scrape, corn, or callus. ? Your wound, scrape, corn, or callus feels warm to the touch. ? You have pus or a bad smell coming from the wound, scrape, corn, or callus. ? You have a fever. ? You have a red line going up your leg. Summary  Check your feet every day  for cuts, sores, red spots, swelling, and blisters.  Moisturize feet and legs daily.  Wear shoes that fit properly and have enough cushioning.  If you have foot problems, report any cuts, sores, or bruises to your health care provider immediately.  Schedule a complete foot exam at least once a year (annually) or more often if you have foot problems. This information is not intended to replace advice given to you by your health care provider. Make sure you discuss any questions you have with your health care provider. Document Revised: 07/24/2019 Document Reviewed: 12/02/2016 Elsevier Patient Education  2020 Elsevier Inc.  

## 2020-04-30 DIAGNOSIS — Z794 Long term (current) use of insulin: Secondary | ICD-10-CM | POA: Diagnosis not present

## 2020-04-30 DIAGNOSIS — H401131 Primary open-angle glaucoma, bilateral, mild stage: Secondary | ICD-10-CM | POA: Diagnosis not present

## 2020-04-30 DIAGNOSIS — E113212 Type 2 diabetes mellitus with mild nonproliferative diabetic retinopathy with macular edema, left eye: Secondary | ICD-10-CM | POA: Diagnosis not present

## 2020-04-30 DIAGNOSIS — H59031 Cystoid macular edema following cataract surgery, right eye: Secondary | ICD-10-CM | POA: Diagnosis not present

## 2020-04-30 DIAGNOSIS — E113291 Type 2 diabetes mellitus with mild nonproliferative diabetic retinopathy without macular edema, right eye: Secondary | ICD-10-CM | POA: Diagnosis not present

## 2020-05-07 ENCOUNTER — Encounter: Payer: Self-pay | Admitting: Podiatry

## 2020-05-07 NOTE — Progress Notes (Signed)
Subjective: Bonnie [name] presents today for for annual diabetic foot examination and painful callus(es) plantar aspect of both feet. Aggravating factors include weightbearing with and without shoe gear. Pain is relieved with periodic professional debridement.   Patient states she experiences pain when bedsheet touches her feet. She would like to know what she can do to prevent this.   Her fiance' is present during today's visit.  [name], MD is patient's PCP. Last visit 10/30/2019.  Past Medical History:  Diagnosis Date  . Anemia   . Angina   . Anxiety   . Carpal tunnel syndrome 12/07/2015   Bilateral  . Diabetes mellitus   . GERD (gastroesophageal reflux disease)   . Hypercholesteremia   . Obesity     Patient Active Problem List   Diagnosis Date Noted  . Hypertensive disorder 04/23/2019  . Cervical radiculopathy 04/24/2018  . Cystoid macular edema following cataract surgery, right eye 05/04/2017  . Cervical pain 03/07/2016  . Sickle cell trait ([place]) 03/07/2016  . Carpal tunnel syndrome 12/07/2015  . Pain in female genitalia on intercourse 09/17/2014  . Nausea with vomiting 04/22/2014  . Diarrhea 04/22/2014  . Essential hypertension, benign 04/20/2014  . Ileitis 04/19/2014  . Cataract 12/25/2013  . Dry eye syndrome 12/25/2013  . Chronic LBP 02/27/2013  . Hernia, rectovaginal 02/12/2013  . Dysfunctional voiding of urine 02/12/2013  . Bladder cystocele 12/06/2012  . Diabetes mellitus ([place]) 12/06/2012  . HLD (hyperlipidemia) 12/06/2012  . Benign neoplasm of meninges ([place]) 12/06/2012  . Mixed incontinence 12/06/2012  . Open angle with borderline findings, high risk 10/17/2011  . Chest pain, atypical 10/01/2011  . Shortness of breath 10/01/2011  . DM type 2 (diabetes mellitus, type 2) ([place]) 10/01/2011  . Type 2 diabetes mellitus with both eyes affected by mild nonproliferative retinopathy without macular edema, with Correa-term current use of insulin ([place])  10/01/2011  . Hypercholesteremia   . Anemia   . Obesity   . Anxiety   . GERD (gastroesophageal reflux disease)     Past Surgical History:  Procedure Laterality Date  . ABDOMINAL HYSTERECTOMY    . 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 BLADDER SURGERY    . BRAIN SURGERY    . BRAIN SURGERY     For meningioma.  . COCCYX REMOVAL    . COLONOSCOPY Left 04/22/2014   Procedure: COLONOSCOPY;  Surgeon: [name], MD;  Location: [facility];  Service: Endoscopy;  Laterality: Left;  . SHOULDER SURGERY    . TONSILLECTOMY      Current Outpatient Medications on File Prior to Visit  Medication Sig Dispense Refill  . conjugated estrogens (PREMARIN) vaginal cream Place vaginally.    Marland Kitchen acetaminophen (TYLENOL) 325 MG tablet Take by mouth.    Marland Kitchen amitriptyline (ELAVIL) 10 MG tablet amitriptyline 10 mg tablet    . ammonium lactate (AMLACTIN) 12 % lotion Apply 1 application topically 2 (two) times daily. 400 g 5  . Blood Glucose Monitoring Suppl (FIFTY50 GLUCOSE METER 2.0) w/Device KIT OneTouch UltraMini kit  USE AS DIRECTED TO CHECK BLOOD SUGARS TWICE A DAY    . carboxymethylcellulose (REFRESH TEARS) 0.5 % SOLN 1 drop See admin instructions. Instill 1 drop in both eyes every morning and 1 drop in right eye every night at bedtime    . carboxymethylcellulose (REFRESH) 1 % ophthalmic solution Place 1 drop into the left eye at bedtime.    . clonazePAM (KLONOPIN) 1 MG tablet Take 1 mg by mouth daily  as needed.    . Dapagliflozin Propanediol (FARXIGA) 10 MG TABS Take 5 mg by mouth daily.    Marland Kitchen dexlansoprazole (DEXILANT) 60 MG capsule Take 60 mg by mouth daily.     Marland Kitchen estradiol (ESTRACE) 0.1 MG/GM vaginal cream Place vaginally.    Marland Kitchen HYDROcodone-acetaminophen (NORCO) 5-325 MG per tablet Take 0.5-2 tablets by mouth 3 (three) times daily as needed. 1/2 tablet for mild pain, 1 tablet for moderate pain, 2 tablets for severe pain    . insulin glargine (LANTUS) 100 UNIT/ML injection Inject 10 Units into the skin at  bedtime.    . Insulin Pen Needle (B-D UF III MINI PEN NEEDLES) 31G X 5 MM MISC USE TWICE A DAY    . Lancets (ONETOUCH ULTRASOFT) lancets OneTouch UltraSoft Lancets    . Liraglutide (VICTOZA) 18 MG/3ML SOPN Inject 7.2 mg into the skin daily. 1.2 mls    . meloxicam (MOBIC) 15 MG tablet meloxicam 15 mg tablet  TAKE 1 TABLET BY MOUTH EVERY DAY FOR 15 DAYS    . metFORMIN (GLUCOPHAGE) 1000 MG tablet Take 1,000 mg by mouth 2 (two) times daily with a meal.     . mupirocin ointment (BACTROBAN) 2 % APPLY TO AFFECTED AREA TWICE A DAY ON FACE    . ONETOUCH VERIO test strip 2 (two) times daily. use for testing    . ramipril (ALTACE) 5 MG capsule Take 5 mg by mouth daily.    . simvastatin (ZOCOR) 40 MG tablet Take 20 mg by mouth daily at 6 PM.     . timolol (TIMOPTIC) 0.5 % ophthalmic solution INSTILL 1 DROP INTO BOTH EYES TWICE A DAY    . TURMERIC PO Take by mouth.    . Urea 39 % CREA Apply 1 application topically daily. 1 Bottle 2  . valACYclovir (VALTREX) 1000 MG tablet Take 500 mg by mouth daily. For outbreak    . vitamin C (ASCORBIC ACID) 500 MG tablet Take 500 mg by mouth daily.    Marland Kitchen VITAMIN D, CHOLECALCIFEROL, PO Take by mouth daily.     No current facility-administered medications on file prior to visit.     Allergies  Allergen Reactions  . Clarithromycin Swelling, Other (See Comments), Anxiety and Shortness Of Breath    Throat swelling Other Reaction: laryngeal edema Throat swelling  Throat swelling Other Reaction: laryngeal edema Other reaction(s): Other (See Comments) Throat swelling Other Reaction: laryngeal edema Throat swelling   . Exenatide Rash, Other (See Comments) and Anaphylaxis    Caused Sweating, sores in the mouth  Also gum soreness/irritation Caused Sweating, sores in the mouth  Caused Sweating, sores in the mouth  Caused Sweating, sores in the mouth  Also gum soreness/irritation   . Ibuprofen Rash and Other (See Comments)    Nose bleed  . Methocarbamol  Anaphylaxis    Other reaction(s): Other (See Comments) Swollen eyes Swollen eyes  . Shellfish Allergy Other (See Comments)    Nausea and vomitting  . Ampicillin Nausea And Vomiting  . Carafate [Sucralfate] Nausea And Vomiting  . Cyanocobalamin   . Metronidazole Nausea And Vomiting and Nausea Only    Pt stated, "Eyes swelled up" Pt stated, "Eyes swelled up" Pt stated, "Eyes swelled up"  . Vitamin B12 Swelling  . Ketoconazole Palpitations and Nausea And Vomiting    Other reaction(s): Irregular Heart Rate Other reaction(s): Irregular Heart Rate   . Mango Flavor Itching and Rash  . Pineapple Rash  . Strawberry Flavor Itching    Social History  Occupational History  . Not on file  Tobacco Use  . Smoking status: Former Research scientist (life sciences)  . Smokeless tobacco: Never Used  Substance and Sexual Activity  . Alcohol use: Yes  . Drug use: No  . Sexual activity: Not on file    Family History  Problem Relation Age of Onset  . Diabetes Mother   . Lung cancer Father   . Diabetes Brother     Immunization History  Administered Date(s) Administered  . Influenza-Unspecified 08/14/2018  . PFIZER SARS-COV-2 Vaccination 01/13/2020, 02/04/2020     Objective: There were no vitals filed for this visit.  Bonnie [name] is a pleasant 64 y.o. African American female in NAD. AAO X 3.  Vascular Examination: Neurovascular status unchanged b/l lower extremities. Capillary refill time to digits immediate b/l. Palpable pedal pulses b/l LE. Pedal hair sparse. Lower extremity skin temperature gradient within normal limits. No pain with calf compression b/l.  Dermatological Examination: Pedal skin with normal turgor, texture and tone bilaterally. No open wounds bilaterally. No interdigital macerations bilaterally. Toenails 1-5 adequate length and well maintained b/l. Hyperkeratotic lesion(s) submet head 1 left foot, submet head 1 right foot, submet head 5 left foot and submet head 5 right foot.  No erythema,  no edema, no drainage, no flocculence.  Musculoskeletal Examination: Normal muscle strength 5/5 to all lower extremity muscle groups bilaterally. No pain crepitus or joint limitation noted with ROM b/l. No gross bony deformities bilaterally. Patient ambulates independent of any assistive aids.  Footwear Assessment: Does the patient wear appropriate shoes? Yes. Does the patient need inserts/orthotics? Yes.  Neurological Examination: Protective sensation intact 5/5 intact bilaterally with 10g monofilament b/l. Vibratory sensation intact b/l. Proprioception intact bilaterally. Babinski reflex negative b/l. Clonus negative b/l.  Assessment: No diagnosis found.   Risk Categorization: Low Risk :  Patient has all of the following: Intact protective sensation No prior foot ulcer  No severe deformity Pedal pulses present  Plan: -Examined patient. -Diabetic foot examination performed on today's visit. -Discussed diabetic foot care principles. Literature dispensed on today. -For sensitive feet, patient was instructed to purchase a bed tent which fits under her mattress and prevents bedsheet from touching her feet. -Callus(es) submet head 1 left foot, submet head 1 right foot, submet head 5 left foot and submet head 5 right foot pared utilizing sterile scalpel blade without complication or incident. Total number debrided =4. -Patient to report any pedal injuries to medical professional immediately. -Patient to continue soft, supportive shoe gear daily. -Patient/POA to call should there be question/concern in the interim.  Return in about 3 months (around 07/30/2020) for diabetic callus trim.  Marzetta Board, DPM

## 2020-06-18 DIAGNOSIS — L729 Follicular cyst of the skin and subcutaneous tissue, unspecified: Secondary | ICD-10-CM | POA: Diagnosis not present

## 2020-06-18 DIAGNOSIS — R21 Rash and other nonspecific skin eruption: Secondary | ICD-10-CM | POA: Diagnosis not present

## 2020-06-18 DIAGNOSIS — L309 Dermatitis, unspecified: Secondary | ICD-10-CM | POA: Diagnosis not present

## 2020-06-23 DIAGNOSIS — H409 Unspecified glaucoma: Secondary | ICD-10-CM | POA: Insufficient documentation

## 2020-06-23 DIAGNOSIS — Z1231 Encounter for screening mammogram for malignant neoplasm of breast: Secondary | ICD-10-CM | POA: Diagnosis not present

## 2020-07-28 DIAGNOSIS — K219 Gastro-esophageal reflux disease without esophagitis: Secondary | ICD-10-CM | POA: Diagnosis not present

## 2020-07-28 DIAGNOSIS — K5903 Drug induced constipation: Secondary | ICD-10-CM | POA: Diagnosis not present

## 2020-07-28 DIAGNOSIS — R1310 Dysphagia, unspecified: Secondary | ICD-10-CM | POA: Diagnosis not present

## 2020-07-29 ENCOUNTER — Other Ambulatory Visit: Payer: Self-pay | Admitting: Gastroenterology

## 2020-07-29 DIAGNOSIS — R1310 Dysphagia, unspecified: Secondary | ICD-10-CM

## 2020-07-30 ENCOUNTER — Ambulatory Visit
Admission: RE | Admit: 2020-07-30 | Discharge: 2020-07-30 | Disposition: A | Discharge status: Home / Self Care | Payer: Medicare Other | Source: Ambulatory Visit | Attending: Gastroenterology | Admitting: Gastroenterology

## 2020-07-30 ENCOUNTER — Other Ambulatory Visit: Payer: Medicare Other

## 2020-07-30 DIAGNOSIS — K224 Dyskinesia of esophagus: Secondary | ICD-10-CM | POA: Diagnosis not present

## 2020-07-30 DIAGNOSIS — R1310 Dysphagia, unspecified: Secondary | ICD-10-CM

## 2020-08-03 ENCOUNTER — Other Ambulatory Visit: Payer: Self-pay

## 2020-08-03 ENCOUNTER — Encounter: Payer: Self-pay | Admitting: Podiatry

## 2020-08-03 ENCOUNTER — Ambulatory Visit: Payer: Medicare Other | Admitting: Podiatry

## 2020-08-03 DIAGNOSIS — D229 Melanocytic nevi, unspecified: Secondary | ICD-10-CM | POA: Diagnosis not present

## 2020-08-03 DIAGNOSIS — L84 Corns and callosities: Secondary | ICD-10-CM

## 2020-08-03 DIAGNOSIS — E1142 Type 2 diabetes mellitus with diabetic polyneuropathy: Secondary | ICD-10-CM | POA: Diagnosis not present

## 2020-08-05 NOTE — Progress Notes (Signed)
Subjective:  Patient ID: Bonnie [name], female    DOB: 26-Mar-1956,  MRN: [id number]  64 y.o. female presents with preventative diabetic foot care and painful porokeratotic lesions plantar aspect of both feet.  Pain prevent comfortable ambulation. Aggravating factor is weightbearing with or without shoegear.   She is concerned about appearance of right hallux with discoloration of nailbed. Denies any trauma to area. Has been present for several months and her husband advised her to get it looked at.  Also c/o neuropathy symptoms of tingling and sharp pains on occasion.   Review of Systems: Negative except as noted in the HPI.  Past Medical History:  Diagnosis Date   Anemia    Angina    Anxiety    Carpal tunnel syndrome 12/07/2015   Bilateral   Diabetes mellitus    GERD (gastroesophageal reflux disease)    Hypercholesteremia    Obesity    Past Surgical History:  Procedure Laterality Date   ABDOMINAL HYSTERECTOMY     BACK SURGERY     2001 for DJD   BLADDER SURGERY     BRAIN SURGERY     BRAIN SURGERY     For meningioma.   COCCYX REMOVAL     COLONOSCOPY Left 04/22/2014   Procedure: COLONOSCOPY;  Surgeon: [name], MD;  Location: [facility];  Service: Endoscopy;  Laterality: Left;   SHOULDER SURGERY     TONSILLECTOMY     Patient Active Problem List   Diagnosis Date Noted   Glaucoma 06/23/2020   Hypertensive disorder 04/23/2019   Cervical radiculopathy 04/24/2018   Cystoid macular edema following cataract surgery, right eye 05/04/2017   Cervical pain 03/07/2016   Sickle cell trait ([place]) 03/07/2016   Carpal tunnel syndrome 12/07/2015   Pain in female genitalia on intercourse 09/17/2014   Nausea with vomiting 04/22/2014   Diarrhea 04/22/2014   Essential hypertension, benign 04/20/2014   Ileitis 04/19/2014   Cataract 12/25/2013   Dry eye syndrome 12/25/2013   Chronic LBP 02/27/2013   Hernia, rectovaginal 02/12/2013   Dysfunctional  voiding of urine 02/12/2013   Bladder cystocele 12/06/2012   Diabetes mellitus ([place]) 12/06/2012   HLD (hyperlipidemia) 12/06/2012   Benign neoplasm of meninges ([place]) 12/06/2012   Mixed incontinence 12/06/2012   Open angle with borderline findings, high risk 10/17/2011   Chest pain, atypical 10/01/2011   Shortness of breath 10/01/2011   DM type 2 (diabetes mellitus, type 2) ([place]) 10/01/2011   Type 2 diabetes mellitus with both eyes affected by mild nonproliferative retinopathy without macular edema, with [place]-term current use of insulin ([place]) 10/01/2011   Hypercholesteremia    Anemia    Obesity    Anxiety    GERD (gastroesophageal reflux disease)     Current Outpatient Medications:    acetaminophen (TYLENOL) 325 MG tablet, Take by mouth., Disp: , Rfl:    amitriptyline (ELAVIL) 10 MG tablet, amitriptyline 10 mg tablet, Disp: , Rfl:    ammonium lactate (AMLACTIN) 12 % lotion, Apply 1 application topically 2 (two) times daily., Disp: 400 g, Rfl: 5   Blood Glucose Monitoring Suppl (FIFTY50 GLUCOSE METER 2.0) w/Device KIT, OneTouch UltraMini kit  USE AS DIRECTED TO CHECK BLOOD SUGARS TWICE A DAY, Disp: , Rfl:    carboxymethylcellulose (REFRESH TEARS) 0.5 % SOLN, 1 drop See admin instructions. Instill 1 drop in both eyes every morning and 1 drop in right eye every night at bedtime, Disp: , Rfl:    carboxymethylcellulose (REFRESH) 1 % ophthalmic solution, Place 1 drop into  the left eye at bedtime., Disp: , Rfl:    clonazePAM (KLONOPIN) 1 MG tablet, Take 1 mg by mouth daily as needed., Disp: , Rfl:    clotrimazole-betamethasone (LOTRISONE) cream, clotrimazole-betamethasone 1 %-0.05 % topical cream  APPLY TOPICALLY TWICE A DAY FOR 10 DAYS, Disp: , Rfl:    clotrimazole-betamethasone (LOTRISONE) cream, Apply topically 2 (two) times daily., Disp: , Rfl:    Dapagliflozin Propanediol (FARXIGA) 10 MG TABS, Take 5 mg by mouth daily., Disp: , Rfl:    dexlansoprazole (DEXILANT) 60  MG capsule, Take 60 mg by mouth daily. , Disp: , Rfl:    estradiol (ESTRACE) 0.1 MG/GM vaginal cream, Place vaginally., Disp: , Rfl:    fluconazole (DIFLUCAN) 150 MG tablet, fluconazole 150 mg tablet  TAKE 1 TABLET EVERY 72 HOURS BY ORAL ROUTE AS DIRECTED., Disp: , Rfl:    fluconazole (DIFLUCAN) 150 MG tablet, Take 150 mg by mouth every 3 (three) days., Disp: , Rfl:    HYDROcodone-acetaminophen (NORCO) 5-325 MG per tablet, Take 0.5-2 tablets by mouth 3 (three) times daily as needed. 1/2 tablet for mild pain, 1 tablet for moderate pain, 2 tablets for severe pain, Disp: , Rfl:    insulin glargine (LANTUS) 100 UNIT/ML injection, Inject 10 Units into the skin at bedtime., Disp: , Rfl:    Insulin Pen Needle (B-D UF III MINI PEN NEEDLES) 31G X 5 MM MISC, USE TWICE A DAY, Disp: , Rfl:    Lancets (ONETOUCH ULTRASOFT) lancets, OneTouch UltraSoft Lancets, Disp: , Rfl:    Liraglutide (VICTOZA) 18 MG/3ML SOPN, Inject 7.2 mg into the skin daily. 1.2 mls, Disp: , Rfl:    meloxicam (MOBIC) 15 MG tablet, meloxicam 15 mg tablet  TAKE 1 TABLET BY MOUTH EVERY DAY FOR 15 DAYS, Disp: , Rfl:    metFORMIN (GLUCOPHAGE) 1000 MG tablet, Take 1,000 mg by mouth 2 (two) times daily with a meal. , Disp: , Rfl:    mupirocin ointment (BACTROBAN) 2 %, APPLY TO AFFECTED AREA TWICE A DAY ON FACE, Disp: , Rfl:    ONETOUCH VERIO test strip, 2 (two) times daily. use for testing, Disp: , Rfl:    ramipril (ALTACE) 5 MG capsule, Take 5 mg by mouth daily., Disp: , Rfl:    simvastatin (ZOCOR) 40 MG tablet, Take 20 mg by mouth daily at 6 PM. , Disp: , Rfl:    timolol (TIMOPTIC) 0.5 % ophthalmic solution, INSTILL 1 DROP INTO BOTH EYES TWICE A DAY, Disp: , Rfl:    TURMERIC PO, Take by mouth., Disp: , Rfl:    Urea 39 % CREA, Apply 1 application topically daily., Disp: 1 Bottle, Rfl: 2   valACYclovir (VALTREX) 1000 MG tablet, Take 500 mg by mouth daily. For outbreak, Disp: , Rfl:    vitamin C (ASCORBIC ACID) 500 MG tablet,  Take 500 mg by mouth daily., Disp: , Rfl:    VITAMIN D, CHOLECALCIFEROL, PO, Take by mouth daily., Disp: , Rfl:  Allergies  Allergen Reactions   Clarithromycin Swelling, Other (See Comments), Anxiety and Shortness Of Breath    Throat swelling Other Reaction: laryngeal edema Throat swelling  Throat swelling Other Reaction: laryngeal edema Other reaction(s): Other (See Comments) Throat swelling Other Reaction: laryngeal edema Throat swelling    Exenatide Rash, Other (See Comments) and Anaphylaxis    Caused Sweating, sores in the mouth  Also gum soreness/irritation Caused Sweating, sores in the mouth  Caused Sweating, sores in the mouth  Caused Sweating, sores in the mouth  Also gum soreness/irritation  Ibuprofen Rash and Other (See Comments)    Nose bleed   Methocarbamol Anaphylaxis    Other reaction(s): Other (See Comments) Swollen eyes Swollen eyes   Shellfish Allergy Other (See Comments)    Nausea and vomitting   Ampicillin Nausea And Vomiting   Carafate [Sucralfate] Nausea And Vomiting   Cyanocobalamin    Metronidazole Nausea And Vomiting and Nausea Only    Pt stated, "Eyes swelled up" Pt stated, "Eyes swelled up" Pt stated, "Eyes swelled up"   Vitamin B12 Swelling   Ketoconazole Palpitations and Nausea And Vomiting    Other reaction(s): Irregular Heart Rate Other reaction(s): Irregular Heart Rate    Mango Flavor Itching and Rash   Pineapple Rash   Strawberry Flavor Itching   Social History   Tobacco Use  Smoking Status Former Smoker  Smokeless Tobacco Never Used   Objective:  There were no vitals filed for this visit. Constitutional Patient is a pleasant 64 y.o. African American female WD, WN in NAD.Marland Kitchen AAO x 3.  Vascular Capillary refill time to digits immediate b/l. Palpable pedal pulses b/l LE. Pedal hair sparse. Lower extremity skin temperature gradient within normal limits. No pain with calf compression LLE. No edema noted b/l lower  extremities. No cyanosis or clubbing noted.  Neurologic Normal speech. Oriented to person, place, and time. Pt has subjective symptoms of neuropathy. Protective sensation intact 5/5 intact bilaterally with 10g monofilament b/l. Vibratory sensation intact b/l. Clonus negative b/l.  Dermatologic Pedal skin with normal turgor, texture and tone bilaterally. No open wounds bilaterally. No interdigital macerations bilaterally. Toenails b/l  well maintained with adequate length. No erythema, no edema, no drainage, no flocculence. Hyperkeratotic lesion(s) submet head 1 left foot, submet head 1 right foot, submet head 5 left foot and submet head 5 right foot.  No erythema, no edema, no drainage, no flocculence. Right hallux nailbed with subdermal macule not consistent with heme. Somewhat asymmetric. Flat with no palpable nodularity/elevation. No surrounding erythema, no edema, no drainage, no fluctuance.  Orthopedic: Normal muscle strength 5/5 to all lower extremity muscle groups bilaterally. No pain crepitus or joint limitation noted with ROM b/l. No gross bony deformities bilaterally. Patient ambulates independent of any assistive aids.   Assessment:   1. Callus   2. Suspicious nevus   3. Diabetic peripheral neuropathy associated with type 2 diabetes mellitus ([place])    Plan:  Patient was evaluated and treated and all questions answered. -Examined patient. -Continue diabetic foot care principles. -For her neuropathy symptoms, I have recommended Capsaicin Cream OTC. Apply as instructed on packaging. I instructed her to apply using gloves and avoid rubbing her eyes or touching her face after application. -Callus(es) submet head 1 left foot, submet head 1 right foot, submet head 5 left foot and submet head 5 right foot pared utilizing sterile scalpel blade without complication or incident. Total number debrided =4. -Patient to report any pedal injuries to medical professional immediately. -We discussed  lesion right hallux. She has established relationship with [facility] and I have recommended she schedule an appointment for evaluation of the toe. She related understanding. I have asked her to have report faxed to my office. -Patient to continue soft, supportive shoe gear daily. -Patient/POA to call should there be question/concern in the interim.  Return in about 3 months (around 11/02/2020).  Marzetta Board, DPM

## 2020-08-07 DIAGNOSIS — Z794 Long term (current) use of insulin: Secondary | ICD-10-CM | POA: Diagnosis not present

## 2020-08-07 DIAGNOSIS — H401131 Primary open-angle glaucoma, bilateral, mild stage: Secondary | ICD-10-CM | POA: Diagnosis not present

## 2020-08-07 DIAGNOSIS — H0288A Meibomian gland dysfunction right eye, upper and lower eyelids: Secondary | ICD-10-CM | POA: Diagnosis not present

## 2020-08-07 DIAGNOSIS — Z79899 Other long term (current) drug therapy: Secondary | ICD-10-CM | POA: Diagnosis not present

## 2020-08-07 DIAGNOSIS — E113293 Type 2 diabetes mellitus with mild nonproliferative diabetic retinopathy without macular edema, bilateral: Secondary | ICD-10-CM | POA: Diagnosis not present

## 2020-08-07 DIAGNOSIS — H00021 Hordeolum internum right upper eyelid: Secondary | ICD-10-CM | POA: Diagnosis not present

## 2020-08-07 DIAGNOSIS — Z961 Presence of intraocular lens: Secondary | ICD-10-CM | POA: Diagnosis not present

## 2020-08-07 DIAGNOSIS — H04123 Dry eye syndrome of bilateral lacrimal glands: Secondary | ICD-10-CM | POA: Diagnosis not present

## 2020-08-07 DIAGNOSIS — H02886 Meibomian gland dysfunction of left eye, unspecified eyelid: Secondary | ICD-10-CM | POA: Diagnosis not present

## 2020-08-07 DIAGNOSIS — H02883 Meibomian gland dysfunction of right eye, unspecified eyelid: Secondary | ICD-10-CM | POA: Diagnosis not present

## 2020-08-07 DIAGNOSIS — H0288B Meibomian gland dysfunction left eye, upper and lower eyelids: Secondary | ICD-10-CM | POA: Diagnosis not present

## 2020-08-20 DIAGNOSIS — H00011 Hordeolum externum right upper eyelid: Secondary | ICD-10-CM | POA: Insufficient documentation

## 2020-08-20 DIAGNOSIS — H401131 Primary open-angle glaucoma, bilateral, mild stage: Secondary | ICD-10-CM | POA: Diagnosis not present

## 2020-08-20 DIAGNOSIS — E113293 Type 2 diabetes mellitus with mild nonproliferative diabetic retinopathy without macular edema, bilateral: Secondary | ICD-10-CM | POA: Diagnosis not present

## 2020-08-20 DIAGNOSIS — Z961 Presence of intraocular lens: Secondary | ICD-10-CM | POA: Diagnosis not present

## 2020-08-20 DIAGNOSIS — H59031 Cystoid macular edema following cataract surgery, right eye: Secondary | ICD-10-CM | POA: Diagnosis not present

## 2020-08-31 DIAGNOSIS — L603 Nail dystrophy: Secondary | ICD-10-CM | POA: Diagnosis not present

## 2020-08-31 DIAGNOSIS — B351 Tinea unguium: Secondary | ICD-10-CM | POA: Diagnosis not present

## 2020-09-16 DIAGNOSIS — H401121 Primary open-angle glaucoma, left eye, mild stage: Secondary | ICD-10-CM | POA: Diagnosis not present

## 2020-09-16 DIAGNOSIS — H00019 Hordeolum externum unspecified eye, unspecified eyelid: Secondary | ICD-10-CM | POA: Diagnosis not present

## 2020-09-16 DIAGNOSIS — H52203 Unspecified astigmatism, bilateral: Secondary | ICD-10-CM | POA: Diagnosis not present

## 2020-09-16 DIAGNOSIS — H524 Presbyopia: Secondary | ICD-10-CM | POA: Diagnosis not present

## 2020-09-16 DIAGNOSIS — H40001 Preglaucoma, unspecified, right eye: Secondary | ICD-10-CM | POA: Diagnosis not present

## 2020-10-13 DIAGNOSIS — E113293 Type 2 diabetes mellitus with mild nonproliferative diabetic retinopathy without macular edema, bilateral: Secondary | ICD-10-CM | POA: Diagnosis not present

## 2020-10-13 DIAGNOSIS — H0014 Chalazion left upper eyelid: Secondary | ICD-10-CM | POA: Diagnosis not present

## 2020-10-13 DIAGNOSIS — Z794 Long term (current) use of insulin: Secondary | ICD-10-CM | POA: Diagnosis not present

## 2020-10-13 DIAGNOSIS — E113291 Type 2 diabetes mellitus with mild nonproliferative diabetic retinopathy without macular edema, right eye: Secondary | ICD-10-CM | POA: Diagnosis not present

## 2020-10-13 DIAGNOSIS — H59031 Cystoid macular edema following cataract surgery, right eye: Secondary | ICD-10-CM | POA: Diagnosis not present

## 2020-10-13 DIAGNOSIS — H401131 Primary open-angle glaucoma, bilateral, mild stage: Secondary | ICD-10-CM | POA: Diagnosis not present

## 2020-10-13 DIAGNOSIS — E113212 Type 2 diabetes mellitus with mild nonproliferative diabetic retinopathy with macular edema, left eye: Secondary | ICD-10-CM | POA: Diagnosis not present

## 2020-10-15 DIAGNOSIS — M5416 Radiculopathy, lumbar region: Secondary | ICD-10-CM | POA: Diagnosis not present

## 2020-10-16 DIAGNOSIS — E782 Mixed hyperlipidemia: Secondary | ICD-10-CM | POA: Diagnosis not present

## 2020-10-16 DIAGNOSIS — E1165 Type 2 diabetes mellitus with hyperglycemia: Secondary | ICD-10-CM | POA: Diagnosis not present

## 2020-10-16 DIAGNOSIS — I10 Essential (primary) hypertension: Secondary | ICD-10-CM | POA: Diagnosis not present

## 2020-10-16 DIAGNOSIS — E113293 Type 2 diabetes mellitus with mild nonproliferative diabetic retinopathy without macular edema, bilateral: Secondary | ICD-10-CM | POA: Diagnosis not present

## 2020-10-16 DIAGNOSIS — E1121 Type 2 diabetes mellitus with diabetic nephropathy: Secondary | ICD-10-CM | POA: Diagnosis not present

## 2020-10-27 DIAGNOSIS — Z0000 Encounter for general adult medical examination without abnormal findings: Secondary | ICD-10-CM | POA: Diagnosis not present

## 2020-10-27 DIAGNOSIS — Z23 Encounter for immunization: Secondary | ICD-10-CM | POA: Diagnosis not present

## 2020-11-02 ENCOUNTER — Ambulatory Visit: Payer: Medicare Other | Admitting: Podiatry

## 2020-11-02 ENCOUNTER — Other Ambulatory Visit: Payer: Self-pay

## 2020-11-02 ENCOUNTER — Encounter: Payer: Self-pay | Admitting: Podiatry

## 2020-11-02 DIAGNOSIS — E1142 Type 2 diabetes mellitus with diabetic polyneuropathy: Secondary | ICD-10-CM | POA: Diagnosis not present

## 2020-11-02 DIAGNOSIS — L84 Corns and callosities: Secondary | ICD-10-CM

## 2020-11-03 ENCOUNTER — Ambulatory Visit: Payer: Self-pay | Admitting: *Deleted

## 2020-11-03 NOTE — Telephone Encounter (Signed)
Pt calling stating that her son may possibly have covid. She is requesting to know the steps she should take to keep herself and family safe. Please advise.   Son just flew in from [place]- he has a cold- he did have + COVID test. Patient wants to know what to do.Her son is at [employer] now- and they have not been with him or exposed.  Patient test due to symptoms: cough, congestion. Patient advised to treat symptoms as needed OTC, contact PCP for follow up and go to ED for trouble breathing ,dehydration or severe weakness. Advised to isolate and safe precautions in the home reviewed. CDC criteria for ending isolation given.

## 2020-11-03 NOTE — Telephone Encounter (Signed)
  Reason for Disposition . COVID-19 Prevention and Healthy Living, questions about . COVID-19 Home Isolation, questions about  Protocols used: CORONAVIRUS (COVID-19) DIAGNOSED OR SUSPECTED-A-AH

## 2020-11-08 NOTE — Progress Notes (Signed)
Subjective:  Patient ID: Bonnie [name], female    DOB: [birth year],  MRN: [id number]  64 y.o. female presents with preventative diabetic foot care and painful porokeratotic lesions plantar aspect of both feet.  Pain prevent comfortable ambulation. Aggravating factor is weightbearing with or without shoegear.   Her fiance is present during today's visit.  She states she did see her Dermatologist who confirmed no malignancy/concern of right hallux. Dermatologist also prescribed a couple of topicals for her to apply to her feet. She states she is sometimes able to apply them and sometimes she cannot get them due to   Also c/o neuropathy symptoms of tingling and sharp pains on occasion.   Review of Systems: Negative except as noted in the HPI.  Past Medical History:  Diagnosis Date  . Anemia   . Angina   . Anxiety   . Carpal tunnel syndrome 12/07/2015   Bilateral  . Diabetes mellitus   . GERD (gastroesophageal reflux disease)   . Hypercholesteremia   . Obesity    Past Surgical History:  Procedure Laterality Date  . ABDOMINAL HYSTERECTOMY    . 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 BLADDER SURGERY    . BRAIN SURGERY    . BRAIN SURGERY     For meningioma.  . COCCYX REMOVAL    . COLONOSCOPY Left 04/22/2014   Procedure: COLONOSCOPY;  Surgeon: [name], MD;  Location: [facility];  Service: Endoscopy;  Laterality: Left;  . SHOULDER SURGERY    . TONSILLECTOMY     Patient Active Problem List   Diagnosis Date Noted  . Glaucoma 06/23/2020  . Hypertensive disorder 04/23/2019  . Cervical radiculopathy 04/24/2018  . Cystoid macular edema following cataract surgery, right eye 05/04/2017  . Cervical pain 03/07/2016  . Sickle cell trait ([place]) 03/07/2016  . Carpal tunnel syndrome 12/07/2015  . Pain in female genitalia on intercourse 09/17/2014  . Nausea with vomiting 04/22/2014  . Diarrhea 04/22/2014  . Essential hypertension, benign 04/20/2014  . Ileitis 04/19/2014  . Cataract  12/25/2013  . Dry eye syndrome 12/25/2013  . Chronic LBP 02/27/2013  . Hernia, rectovaginal 02/12/2013  . Dysfunctional voiding of urine 02/12/2013  . Bladder cystocele 12/06/2012  . Diabetes mellitus ([place]) 12/06/2012  . HLD (hyperlipidemia) 12/06/2012  . Benign neoplasm of meninges ([place]) 12/06/2012  . Mixed incontinence 12/06/2012  . Open angle with borderline findings, high risk 10/17/2011  . Chest pain, atypical 10/01/2011  . Shortness of breath 10/01/2011  . DM type 2 (diabetes mellitus, type 2) ([place]) 10/01/2011  . Type 2 diabetes mellitus with both eyes affected by mild nonproliferative retinopathy without macular edema, with [place]-term current use of insulin ([place]) 10/01/2011  . Hypercholesteremia   . Anemia   . Obesity   . Anxiety   . GERD (gastroesophageal reflux disease)     Current Outpatient Medications:  .  acetaminophen (TYLENOL) 325 MG tablet, Take by mouth., Disp: , Rfl:  .  amitriptyline (ELAVIL) 10 MG tablet, amitriptyline 10 mg tablet, Disp: , Rfl:  .  ammonium lactate (AMLACTIN) 12 % lotion, Apply 1 application topically 2 (two) times daily., Disp: 400 g, Rfl: 5 .  Blood Glucose Monitoring Suppl (FIFTY50 GLUCOSE METER 2.0) w/Device KIT, OneTouch UltraMini kit  USE AS DIRECTED TO CHECK BLOOD SUGARS TWICE A DAY, Disp: , Rfl:  .  carboxymethylcellulose (REFRESH TEARS) 0.5 % SOLN, 1 drop See admin instructions. Instill 1 drop in both eyes every morning and 1 drop in right  eye every night at bedtime, Disp: , Rfl:  .  carboxymethylcellulose (REFRESH) 1 % ophthalmic solution, Place 1 drop into the left eye at bedtime., Disp: , Rfl:  .  clonazePAM (KLONOPIN) 1 MG tablet, Take 1 mg by mouth daily as needed., Disp: , Rfl:  .  clotrimazole-betamethasone (LOTRISONE) cream, clotrimazole-betamethasone 1 %-0.05 % topical cream  APPLY TOPICALLY TWICE A DAY FOR 10 DAYS, Disp: , Rfl:  .  clotrimazole-betamethasone (LOTRISONE) cream, Apply topically 2 (two) times daily., Disp: , Rfl:  .   Dapagliflozin Propanediol (FARXIGA) 10 MG TABS, Take 5 mg by mouth daily., Disp: , Rfl:  .  dexlansoprazole (DEXILANT) 60 MG capsule, Take 60 mg by mouth daily. , Disp: , Rfl:  .  estradiol (ESTRACE) 0.1 MG/GM vaginal cream, Place vaginally., Disp: , Rfl:  .  fluconazole (DIFLUCAN) 150 MG tablet, fluconazole 150 mg tablet  TAKE 1 TABLET EVERY 72 HOURS BY ORAL ROUTE AS DIRECTED., Disp: , Rfl:  .  fluconazole (DIFLUCAN) 150 MG tablet, Take 150 mg by mouth every 3 (three) days., Disp: , Rfl:  .  HYDROcodone-acetaminophen (NORCO) 5-325 MG per tablet, Take 0.5-2 tablets by mouth 3 (three) times daily as needed. 1/2 tablet for mild pain, 1 tablet for moderate pain, 2 tablets for severe pain, Disp: , Rfl:  .  insulin glargine (LANTUS) 100 UNIT/ML injection, Inject 10 Units into the skin at bedtime., Disp: , Rfl:  .  Insulin Pen Needle (B-D UF III MINI PEN NEEDLES) 31G X 5 MM MISC, USE TWICE A DAY, Disp: , Rfl:  .  Lancets (ONETOUCH ULTRASOFT) lancets, OneTouch UltraSoft Lancets, Disp: , Rfl:  .  Liraglutide (VICTOZA) 18 MG/3ML SOPN, Inject 7.2 mg into the skin daily. 1.2 mls, Disp: , Rfl:  .  meloxicam (MOBIC) 15 MG tablet, meloxicam 15 mg tablet  TAKE 1 TABLET BY MOUTH EVERY DAY FOR 15 DAYS, Disp: , Rfl:  .  metFORMIN (GLUCOPHAGE) 1000 MG tablet, Take 1,000 mg by mouth 2 (two) times daily with a meal. , Disp: , Rfl:  .  mupirocin ointment (BACTROBAN) 2 %, APPLY TO AFFECTED AREA TWICE A DAY ON FACE, Disp: , Rfl:  .  ONETOUCH VERIO test strip, 2 (two) times daily. use for testing, Disp: , Rfl:  .  ramipril (ALTACE) 5 MG capsule, Take 5 mg by mouth daily., Disp: , Rfl:  .  simvastatin (ZOCOR) 40 MG tablet, Take 20 mg by mouth daily at 6 PM. , Disp: , Rfl:  .  timolol (TIMOPTIC) 0.5 % ophthalmic solution, INSTILL 1 DROP INTO BOTH EYES TWICE A DAY, Disp: , Rfl:  .  TURMERIC PO, Take by mouth., Disp: , Rfl:  .  Urea 39 % CREA, Apply 1 application topically daily., Disp: 1 Bottle, Rfl: 2 .  valACYclovir  (VALTREX) 1000 MG tablet, Take 500 mg by mouth daily. For outbreak, Disp: , Rfl:  .  vitamin C (ASCORBIC ACID) 500 MG tablet, Take 500 mg by mouth daily., Disp: , Rfl:  .  VITAMIN D, CHOLECALCIFEROL, PO, Take by mouth daily., Disp: , Rfl:  Allergies  Allergen Reactions  . Clarithromycin Swelling, Other (See Comments), Anxiety and Shortness Of Breath    Throat swelling Other Reaction: laryngeal edema Throat swelling  Throat swelling Other Reaction: laryngeal edema Other reaction(s): Other (See Comments) Throat swelling Other Reaction: laryngeal edema Throat swelling   . Exenatide Rash, Other (See Comments) and Anaphylaxis    Caused Sweating, sores in the mouth  Also gum soreness/irritation Caused Sweating, sores  in the mouth  Caused Sweating, sores in the mouth  Caused Sweating, sores in the mouth  Also gum soreness/irritation   . Ibuprofen Rash and Other (See Comments)    Nose bleed  . Methocarbamol Anaphylaxis    Other reaction(s): Other (See Comments) Swollen eyes Swollen eyes  . Shellfish Allergy Other (See Comments)    Nausea and vomitting  . Ampicillin Nausea And Vomiting  . Carafate [Sucralfate] Nausea And Vomiting  . Cephalexin     Other reaction(s): breathing issues  . Cyanocobalamin   . Glyburide     Other reaction(s): ??  . Metronidazole Nausea And Vomiting and Nausea Only    Pt stated, "Eyes swelled up" Pt stated, "Eyes swelled up" Pt stated, "Eyes swelled up"  . Prednisone     Other reaction(s): BS fluctuations  . Pregabalin     Other reaction(s): disoriented  . Vitamin B12 Swelling  . Ketoconazole Palpitations and Nausea And Vomiting    Other reaction(s): Irregular Heart Rate Other reaction(s): Irregular Heart Rate   . Mango Flavor Itching and Rash  . Pineapple Rash  . Strawberry Flavor Itching   Social History   Tobacco Use  Smoking Status Former Smoker  Smokeless Tobacco Never Used   Objective:  There were no vitals filed for this  visit. Constitutional Patient is a pleasant 64 y.o. African American female WD, WN in NAD.Marland Kitchen AAO x 3.  Vascular Capillary refill time to digits immediate b/l. Palpable pedal pulses b/l LE. Pedal hair sparse. Lower extremity skin temperature gradient within normal limits. No pain with calf compression LLE. No edema noted b/l lower extremities. No cyanosis or clubbing noted.  Neurologic Normal speech. Oriented to person, place, and time. Pt has subjective symptoms of neuropathy. Protective sensation intact 5/5 intact bilaterally with 10g monofilament b/l. Vibratory sensation intact b/l. Clonus negative b/l.  Dermatologic Pedal skin with normal turgor, texture and tone bilaterally. No open wounds bilaterally. No interdigital macerations bilaterally. Toenails b/l well maintained with adequate length. No erythema, no edema, no drainage, no flocculence. Hyperkeratotic lesion(s) submet head 1 left foot, submet head 1 right foot, submet head 5 left foot and submet head 5 right foot.  No erythema, no edema, no drainage, no flocculence. Right hallux nailbed with subdermal macule not consistent with heme. Somewhat asymmetric. Flat with no palpable nodularity/elevation. No surrounding erythema, no edema, no drainage, no fluctuance.  Orthopedic: Normal muscle strength 5/5 to all lower extremity muscle groups bilaterally. No pain crepitus or joint limitation noted with ROM b/l. No gross bony deformities bilaterally. Patient ambulates independent of any assistive aids.   Assessment:   1. Callus   2. Diabetic peripheral neuropathy associated with type 2 diabetes mellitus ([place])    Plan:  Patient was evaluated and treated and all questions answered. -Examined patient. -Continue diabetic foot care principles. -Callus(es) submet head 1 left foot, submet head 1 right foot, submet head 5 left foot and submet head 5 right foot pared utilizing sterile scalpel blade without complication or incident. Total number debrided  =4. -Patient to report any pedal injuries to medical professional immediately. -She has seen Dermatology for evaluation of right hallux and was told macule was benign. -Patient to continue soft, supportive shoe gear daily. -Patient/POA to call should there be question/concern in the interim.  Return in about 3 months (around 01/31/2021) for diabetic foot care.  Marzetta Board, DPM

## 2020-11-17 DIAGNOSIS — E118 Type 2 diabetes mellitus with unspecified complications: Secondary | ICD-10-CM | POA: Diagnosis not present

## 2020-11-17 DIAGNOSIS — Z794 Long term (current) use of insulin: Secondary | ICD-10-CM | POA: Diagnosis not present

## 2020-11-17 DIAGNOSIS — E1121 Type 2 diabetes mellitus with diabetic nephropathy: Secondary | ICD-10-CM | POA: Diagnosis not present

## 2020-11-17 DIAGNOSIS — E782 Mixed hyperlipidemia: Secondary | ICD-10-CM | POA: Diagnosis not present

## 2020-11-17 DIAGNOSIS — Z0000 Encounter for general adult medical examination without abnormal findings: Secondary | ICD-10-CM | POA: Diagnosis not present

## 2020-11-17 DIAGNOSIS — M150 Primary generalized (osteo)arthritis: Secondary | ICD-10-CM | POA: Diagnosis not present

## 2020-11-17 DIAGNOSIS — E113293 Type 2 diabetes mellitus with mild nonproliferative diabetic retinopathy without macular edema, bilateral: Secondary | ICD-10-CM | POA: Diagnosis not present

## 2020-11-17 DIAGNOSIS — I10 Essential (primary) hypertension: Secondary | ICD-10-CM | POA: Diagnosis not present

## 2020-11-17 DIAGNOSIS — I700 Atherosclerosis of aorta: Secondary | ICD-10-CM | POA: Diagnosis not present

## 2020-11-26 DIAGNOSIS — Z794 Long term (current) use of insulin: Secondary | ICD-10-CM | POA: Diagnosis not present

## 2020-11-26 DIAGNOSIS — H401130 Primary open-angle glaucoma, bilateral, stage unspecified: Secondary | ICD-10-CM | POA: Diagnosis not present

## 2020-11-26 DIAGNOSIS — E113212 Type 2 diabetes mellitus with mild nonproliferative diabetic retinopathy with macular edema, left eye: Secondary | ICD-10-CM | POA: Diagnosis not present

## 2020-11-26 DIAGNOSIS — Z961 Presence of intraocular lens: Secondary | ICD-10-CM | POA: Diagnosis not present

## 2020-11-26 DIAGNOSIS — H0014 Chalazion left upper eyelid: Secondary | ICD-10-CM | POA: Diagnosis not present

## 2020-11-26 DIAGNOSIS — Z7984 Long term (current) use of oral hypoglycemic drugs: Secondary | ICD-10-CM | POA: Diagnosis not present

## 2020-11-26 DIAGNOSIS — E113291 Type 2 diabetes mellitus with mild nonproliferative diabetic retinopathy without macular edema, right eye: Secondary | ICD-10-CM | POA: Diagnosis not present

## 2021-01-19 DIAGNOSIS — K219 Gastro-esophageal reflux disease without esophagitis: Secondary | ICD-10-CM | POA: Diagnosis not present

## 2021-01-19 DIAGNOSIS — R1310 Dysphagia, unspecified: Secondary | ICD-10-CM | POA: Diagnosis not present

## 2021-01-19 DIAGNOSIS — R933 Abnormal findings on diagnostic imaging of other parts of digestive tract: Secondary | ICD-10-CM | POA: Diagnosis not present

## 2021-01-20 ENCOUNTER — Other Ambulatory Visit: Payer: Self-pay | Admitting: Gastroenterology

## 2021-02-03 ENCOUNTER — Ambulatory Visit: Payer: Medicare Other | Admitting: Podiatry

## 2021-02-05 ENCOUNTER — Other Ambulatory Visit: Payer: Self-pay

## 2021-02-05 ENCOUNTER — Ambulatory Visit: Payer: Medicare Other | Admitting: Podiatry

## 2021-02-05 ENCOUNTER — Encounter: Payer: Self-pay | Admitting: Podiatry

## 2021-02-05 DIAGNOSIS — L84 Corns and callosities: Secondary | ICD-10-CM

## 2021-02-05 DIAGNOSIS — M79675 Pain in left toe(s): Secondary | ICD-10-CM

## 2021-02-05 DIAGNOSIS — E1142 Type 2 diabetes mellitus with diabetic polyneuropathy: Secondary | ICD-10-CM | POA: Diagnosis not present

## 2021-02-05 DIAGNOSIS — B351 Tinea unguium: Secondary | ICD-10-CM | POA: Diagnosis not present

## 2021-02-05 DIAGNOSIS — M79674 Pain in right toe(s): Secondary | ICD-10-CM

## 2021-02-07 NOTE — Progress Notes (Signed)
Subjective:  Patient ID: Bonnie [name], female    DOB: 10-13-1956,  MRN: [id number]  65 y.o. female presents with preventative diabetic foot care and painful porokeratotic lesions plantar aspect of both feet.  Pain prevent comfortable ambulation. Aggravating factor is weightbearing with or without shoegear.   Her husband is present during today's visit. She states her blood glucose was 123 mg/dl this morning.  Also c/o neuropathy symptoms of tingling and sharp pains on occasion.   Review of Systems: Negative except as noted in the HPI.   Allergies  Allergen Reactions  . Clarithromycin Shortness Of Breath, Swelling, Anxiety and Other (See Comments)    Throat swelling Other Reaction: laryngeal edema  . Exenatide Anaphylaxis, Rash and Other (See Comments)     Caused Sweating, sores in the mouth  Also gum soreness/irritation   . Ibuprofen Rash and Other (See Comments)    Nose bleed  . Methocarbamol Anaphylaxis and Swelling    Swollen eyes  . Shellfish Allergy Nausea And Vomiting  . Ampicillin Nausea And Vomiting  . Carafate [Sucralfate] Nausea And Vomiting  . Cephalexin     Other reaction(s): breathing issues  . Cyanocobalamin   . Glyburide     Other reaction(s): ??  . Metronidazole Nausea And Vomiting, Nausea Only and Swelling    Pt stated, "Eyes swelled up"  . Prednisone     Other reaction(s): BS fluctuations  . Pregabalin     Other reaction(s): disoriented  . Vitamin B12 Swelling  . Ketoconazole Nausea And Vomiting and Palpitations    Other reaction(s): Irregular Heart Rate   . Mango Flavor Itching and Rash  . Pineapple Rash  . Strawberry Flavor Itching   Objective:  There were no vitals filed for this visit. Constitutional Patient is a pleasant 65 y.o. African American female WD, WN in NAD.Marland Kitchen AAO x 3.  Vascular Capillary refill time to digits immediate b/l. Palpable pedal pulses b/l LE. Pedal hair sparse. Lower extremity skin temperature gradient within normal limits.  No pain with calf compression LLE. No edema noted b/l lower extremities. No cyanosis or clubbing noted.  Neurologic Normal speech. Oriented to person, place, and time. Pt has subjective symptoms of neuropathy. Protective sensation intact 5/5 intact bilaterally with 10g monofilament b/l. Vibratory sensation intact b/l. Clonus negative b/l.  Dermatologic Pedal skin with normal turgor, texture and tone bilaterally. No open wounds bilaterally. No interdigital macerations bilaterally. Toenails b/l well maintained with adequate length. No erythema, no edema, no drainage, no flocculence. Hyperkeratotic lesion(s) submet head 1 left foot, submet head 1 right foot, submet head 5 left foot and submet head 5 right foot.  No erythema, no edema, no drainage, no flocculence. Right hallux nailbed with subdermal macule not consistent with heme. Somewhat asymmetric. Flat with no palpable nodularity/elevation. No surrounding erythema, no edema, no drainage, no fluctuance.  Orthopedic: Normal muscle strength 5/5 to all lower extremity muscle groups bilaterally. No pain crepitus or joint limitation noted with ROM b/l. No gross bony deformities bilaterally. Patient ambulates independent of any assistive aids.   Assessment:   1. Pain due to onychomycosis of toenails of both feet   2. Callus   3. Diabetic peripheral neuropathy associated with type 2 diabetes mellitus ([place])    Plan:  Patient was evaluated and treated and all questions answered. -Examined patient. -Continue diabetic foot care principles. -Callus(es) submet head 1 left foot, submet head 1 right foot, submet head 5 left foot and submet head 5 right foot pared utilizing sterile scalpel blade  without complication or incident. Total number debrided =4. -Patient to report any pedal injuries to medical professional immediately. -She has seen Dermatology for evaluation of right hallux and was told macule was benign. -Patient to continue soft, supportive shoe gear  daily. -Patient/POA to call should there be question/concern in the interim.  Return in about 3 months (around 05/08/2021).  Marzetta Board, DPM

## 2021-02-08 DIAGNOSIS — H0014 Chalazion left upper eyelid: Secondary | ICD-10-CM | POA: Diagnosis not present

## 2021-02-08 DIAGNOSIS — E113212 Type 2 diabetes mellitus with mild nonproliferative diabetic retinopathy with macular edema, left eye: Secondary | ICD-10-CM | POA: Diagnosis not present

## 2021-02-08 DIAGNOSIS — Z794 Long term (current) use of insulin: Secondary | ICD-10-CM | POA: Diagnosis not present

## 2021-02-08 DIAGNOSIS — Z961 Presence of intraocular lens: Secondary | ICD-10-CM | POA: Diagnosis not present

## 2021-02-08 DIAGNOSIS — H401131 Primary open-angle glaucoma, bilateral, mild stage: Secondary | ICD-10-CM | POA: Diagnosis not present

## 2021-02-09 ENCOUNTER — Other Ambulatory Visit (HOSPITAL_COMMUNITY)
Admission: RE | Admit: 2021-02-09 | Discharge: 2021-02-09 | Disposition: A | Discharge status: Home / Self Care | Payer: Medicare Other | Source: Ambulatory Visit | Attending: Gastroenterology | Admitting: Gastroenterology

## 2021-02-09 ENCOUNTER — Other Ambulatory Visit: Payer: Self-pay

## 2021-02-09 ENCOUNTER — Encounter (HOSPITAL_COMMUNITY): Payer: Self-pay | Admitting: Gastroenterology

## 2021-02-09 DIAGNOSIS — Z20822 Contact with and (suspected) exposure to covid-19: Secondary | ICD-10-CM | POA: Insufficient documentation

## 2021-02-09 DIAGNOSIS — Z01812 Encounter for preprocedural laboratory examination: Secondary | ICD-10-CM | POA: Insufficient documentation

## 2021-02-09 LAB — SARS CORONAVIRUS 2 (TAT 6-24 HRS): SARS Coronavirus 2: NEGATIVE

## 2021-02-11 NOTE — Anesthesia Preprocedure Evaluation (Addendum)
Anesthesia Evaluation  Patient identified by MRN, date of birth, ID band Patient awake    Reviewed: Allergy & Precautions, NPO status , Patient's Chart, lab work & pertinent test results  Airway Mallampati: II  TM Distance: >3 FB Neck ROM: Full    Dental no notable dental hx. (+) Upper Dentures, Lower Dentures   Pulmonary former smoker,    Pulmonary exam normal breath sounds clear to auscultation       Cardiovascular hypertension, Pt. on medications Normal cardiovascular exam Rhythm:Regular Rate:Normal     Neuro/Psych    GI/Hepatic   Endo/Other  diabetes  Renal/GU      Musculoskeletal   Abdominal   Peds  Hematology   Anesthesia Other Findings All : see list  Reproductive/Obstetrics                           Anesthesia Physical Anesthesia Plan  ASA: III  Anesthesia Plan: MAC   Post-op Pain Management:    Induction:   PONV Risk Score and Plan: Treatment may vary due to age or medical condition  Airway Management Planned: Natural Airway and Nasal Cannula  Additional Equipment: None  Intra-op Plan:   Post-operative Plan:   Informed Consent: I have reviewed the patients History and Physical, chart, labs and discussed the procedure including the risks, benefits and alternatives for the proposed anesthesia with the patient or authorized representative who has indicated his/her understanding and acceptance.     Dental advisory given  Plan Discussed with: CRNA and Anesthesiologist  Anesthesia Plan Comments: (EGD for dysphagia)       Anesthesia Quick Evaluation

## 2021-02-12 ENCOUNTER — Encounter (HOSPITAL_COMMUNITY): Payer: Self-pay | Admitting: Gastroenterology

## 2021-02-12 ENCOUNTER — Ambulatory Visit (HOSPITAL_COMMUNITY)
Admission: RE | Admit: 2021-02-12 | Discharge: 2021-02-12 | Disposition: A | Discharge status: Home / Self Care | Payer: Medicare Other | Attending: Gastroenterology | Admitting: Gastroenterology

## 2021-02-12 ENCOUNTER — Ambulatory Visit (HOSPITAL_COMMUNITY): Payer: Medicare Other | Admitting: Anesthesiology

## 2021-02-12 ENCOUNTER — Other Ambulatory Visit: Payer: Self-pay

## 2021-02-12 ENCOUNTER — Encounter (HOSPITAL_COMMUNITY)
Admission: RE | Disposition: A | Discharge status: Home / Self Care | Payer: Self-pay | Source: Home / Self Care | Attending: Gastroenterology

## 2021-02-12 DIAGNOSIS — Z801 Family history of malignant neoplasm of trachea, bronchus and lung: Secondary | ICD-10-CM | POA: Diagnosis not present

## 2021-02-12 DIAGNOSIS — I10 Essential (primary) hypertension: Secondary | ICD-10-CM | POA: Insufficient documentation

## 2021-02-12 DIAGNOSIS — Z91013 Allergy to seafood: Secondary | ICD-10-CM | POA: Diagnosis not present

## 2021-02-12 DIAGNOSIS — E7800 Pure hypercholesterolemia, unspecified: Secondary | ICD-10-CM | POA: Diagnosis not present

## 2021-02-12 DIAGNOSIS — K219 Gastro-esophageal reflux disease without esophagitis: Secondary | ICD-10-CM | POA: Diagnosis not present

## 2021-02-12 DIAGNOSIS — Z880 Allergy status to penicillin: Secondary | ICD-10-CM | POA: Insufficient documentation

## 2021-02-12 DIAGNOSIS — Z881 Allergy status to other antibiotic agents status: Secondary | ICD-10-CM | POA: Diagnosis not present

## 2021-02-12 DIAGNOSIS — Z833 Family history of diabetes mellitus: Secondary | ICD-10-CM | POA: Diagnosis not present

## 2021-02-12 DIAGNOSIS — R1310 Dysphagia, unspecified: Secondary | ICD-10-CM | POA: Diagnosis not present

## 2021-02-12 DIAGNOSIS — K222 Esophageal obstruction: Secondary | ICD-10-CM | POA: Insufficient documentation

## 2021-02-12 DIAGNOSIS — Z886 Allergy status to analgesic agent status: Secondary | ICD-10-CM | POA: Insufficient documentation

## 2021-02-12 DIAGNOSIS — Z883 Allergy status to other anti-infective agents status: Secondary | ICD-10-CM | POA: Insufficient documentation

## 2021-02-12 DIAGNOSIS — Z888 Allergy status to other drugs, medicaments and biological substances status: Secondary | ICD-10-CM | POA: Diagnosis not present

## 2021-02-12 DIAGNOSIS — E119 Type 2 diabetes mellitus without complications: Secondary | ICD-10-CM | POA: Diagnosis not present

## 2021-02-12 DIAGNOSIS — Z87891 Personal history of nicotine dependence: Secondary | ICD-10-CM | POA: Insufficient documentation

## 2021-02-12 DIAGNOSIS — D649 Anemia, unspecified: Secondary | ICD-10-CM | POA: Diagnosis not present

## 2021-02-12 DIAGNOSIS — Z91018 Allergy to other foods: Secondary | ICD-10-CM | POA: Insufficient documentation

## 2021-02-12 HISTORY — DX: Essential (primary) hypertension: I10

## 2021-02-12 HISTORY — DX: Presence of dental prosthetic device (complete) (partial): Z97.2

## 2021-02-12 HISTORY — PX: SAVORY DILATION: SHX5439

## 2021-02-12 HISTORY — PX: ESOPHAGOGASTRODUODENOSCOPY (EGD) WITH PROPOFOL: SHX5813

## 2021-02-12 LAB — GLUCOSE, CAPILLARY: Glucose-Capillary: 129 mg/dL — ABNORMAL HIGH (ref 70–99)

## 2021-02-12 SURGERY — ESOPHAGOGASTRODUODENOSCOPY (EGD) WITH PROPOFOL
Anesthesia: Monitor Anesthesia Care

## 2021-02-12 MED ORDER — PROPOFOL 500 MG/50ML IV EMUL
INTRAVENOUS | Status: DC | PRN
  Administered 2021-02-12: 150 ug/kg/min via INTRAVENOUS

## 2021-02-12 MED ORDER — LACTATED RINGERS IV SOLN: INTRAVENOUS | Status: DC

## 2021-02-12 MED ORDER — PROPOFOL 1000 MG/100ML IV EMUL
INTRAVENOUS | Status: AC
  Filled 2021-02-12: qty 300

## 2021-02-12 MED ORDER — LIDOCAINE 2% (20 MG/ML) 5 ML SYRINGE
INTRAMUSCULAR | Status: DC | PRN
  Administered 2021-02-12: 80 mg via INTRAVENOUS

## 2021-02-12 SURGICAL SUPPLY — 15 items

## 2021-02-12 NOTE — Transfer of Care (Signed)
Immediate Anesthesia Transfer of Care Note  Patient: Karen Chafe Hartt  Procedure(s) Performed: ESOPHAGOGASTRODUODENOSCOPY (EGD) WITH PROPOFOL (N/A ) SAVORY DILATION (N/A )  Patient Location: Endoscopy Unit  Anesthesia Type:MAC  Level of Consciousness: awake, alert , oriented and patient cooperative  Airway & Oxygen Therapy: Patient Spontanous Breathing and Patient connected to face mask oxygen  Post-op Assessment: Report given to RN, Post -op Vital signs reviewed and stable and Patient moving all extremities  Post vital signs: Reviewed and stable  Last Vitals:  Vitals Value Taken Time  BP 145/63 02/12/21 0830  Temp 36.5 C 02/12/21 0828  Pulse 76 02/12/21 0834  Resp 19 02/12/21 0834  SpO2 98 % 02/12/21 0834  Vitals shown include unvalidated device data.  Last Pain:  Vitals:   02/12/21 0828  TempSrc: Axillary  PainSc: 0-No pain         Complications: No complications documented.

## 2021-02-12 NOTE — Discharge Instructions (Signed)
YOU HAD AN ENDOSCOPIC PROCEDURE TODAY: Refer to the procedure report and other information in the discharge instructions given to you for any specific questions about what was found during the examination. If this information does not answer your questions, please call [place] at [phone] to clarify.   YOU SHOULD EXPECT: Some feelings of bloating in the abdomen. Passage of more gas than usual. Walking can help get rid of the air that was put into your GI tract during the procedure and reduce the bloating. If you had a lower endoscopy (such as a colonoscopy or flexible sigmoidoscopy) you may notice spotting of blood in your stool or on the toilet paper. Some abdominal soreness may be present for a day or two, also.  DIET:Clear liquids for 6 hours then Your first meal following the procedure should be a light meal and then it is ok to progress to your normal diet. A half-sandwich or bowl of soup is an example of a good first meal. Heavy or fried foods are harder to digest and may make you feel nauseous or bloated. Drink plenty of fluids but you should avoid alcoholic beverages for 24 hours. If you had an esophageal dilation, please see attached information for diet.   ACTIVITY: Your care partner should take you home directly after the procedure. You should plan to take it easy, moving slowly for the rest of the day. You can resume normal activity the day after the procedure however YOU SHOULD NOT DRIVE, use power tools, machinery or perform tasks that involve climbing or major physical exertion for 24 hours (because of the sedation medicines used during the test).   SYMPTOMS TO REPORT IMMEDIATELY: A gastroenterologist can be reached at any hour. Please call [phone]  for any of the following symptoms:   Following upper endoscopy (EGD, EUS, ERCP, esophageal dilation) Vomiting of blood or coffee ground material  New, significant abdominal pain  New, significant chest pain or pain under  the shoulder blades  Painful or persistently difficult swallowing  New shortness of breath  Black, tarry-looking or red, bloody stools  FOLLOW UP:  If any biopsies were taken you will be contacted by phone or by letter within the next 1-3 weeks. Call [phone]  if you have not heard about the biopsies in 3 weeks.  Please also call with any specific questions about appointments or follow up tests.

## 2021-02-12 NOTE — Op Note (Signed)
[facility] Patient Name: Bonnie [name] Procedure Date: 02/12/2021 MRN: [id number] Attending MD: Carol Ada , MD Date of Birth: [birth year] CSN: [id number] Age: 65 Admit Type: Outpatient Procedure:                Upper GI endoscopy Indications:              Dysphagia Providers:                Carol Ada, MD, Carmie End, RN, Fransico Setters                            Mbumina, Technician Referring MD:              Medicines:                Propofol per Anesthesia Complications:            No immediate complications. Estimated Blood Loss:     Estimated blood loss was minimal. Procedure:                Pre-Anesthesia Assessment:                           - Prior to the procedure, a History and Physical                            was performed, and patient medications and                            allergies were reviewed. The patient's tolerance of                            previous anesthesia was also reviewed. The risks                            and benefits of the procedure and the sedation                            options and risks were discussed with the patient.                            All questions were answered, and informed consent                            was obtained. Prior Anticoagulants: The patient has                            taken no previous anticoagulant or antiplatelet                            agents. ASA Grade Assessment: III - A patient with                            severe systemic disease. After reviewing the risks                            and  benefits, the patient was deemed in                            satisfactory condition to undergo the procedure.                           - Sedation was administered by an anesthesia                            professional. Deep sedation was attained.                           After obtaining informed consent, the endoscope was                            passed under direct vision. Throughout the                             procedure, the patient's blood pressure, pulse, and                            oxygen saturations were monitored continuously. The                            GIF-H190 ([id number]) Olympus gastroscope was                            introduced through the mouth, and advanced to the                            second part of duodenum. The upper GI endoscopy was                            accomplished without difficulty. The patient                            tolerated the procedure well. Scope In: Scope Out: Findings:      One benign-appearing, intrinsic mild stenosis was found at the       cricopharyngeus. This stenosis measured 1.6 cm (inner diameter) x 1 cm       (in length). The stenosis was traversed. A guidewire was placed and the       scope was withdrawn. Dilation was performed with a Savary dilator with       no resistance at 18 mm. The dilation site was examined following       endoscope reinsertion and showed complete resolution of luminal       narrowing. Estimated blood loss was minimal.      The stomach was normal.      The examined duodenum was normal. Impression:               - Benign-appearing esophageal stenosis. Dilated.                           - Normal stomach.                           -  Normal examined duodenum.                           - No specimens collected. Moderate Sedation:      Not Applicable - Patient had care per Anesthesia. Recommendation:           - Patient has a contact number available for                            emergencies. The signs and symptoms of potential                            delayed complications were discussed with the                            patient. Return to normal activities tomorrow.                            Written discharge instructions were provided to the                            patient.                           - Resume previous diet.                           - Continue present  medications.                           - Return to GI clinic in 4 weeks. Procedure Code(s):        --- Professional ---                           [phone], Esophagogastroduodenoscopy, flexible,                            transoral; with insertion of guide wire followed by                            passage of dilator(s) through esophagus over guide                            wire Diagnosis Code(s):        --- Professional ---                           K22.2, Esophageal obstruction                           R13.10, Dysphagia, unspecified CPT copyright 2019 American [employer] Association. All rights reserved. The codes documented in this report are preliminary and upon coder review may  be revised to meet current compliance requirements. Carol Ada, MD Carol Ada, MD 02/12/2021 8:31:18 AM This report has been signed electronically. Number of Addenda: 0

## 2021-02-12 NOTE — Anesthesia Postprocedure Evaluation (Signed)
Anesthesia Post Note  Patient: Karen Chafe Gibler  Procedure(s) Performed: ESOPHAGOGASTRODUODENOSCOPY (EGD) WITH PROPOFOL (N/A ) SAVORY DILATION (N/A )     Patient location during evaluation: Endoscopy Anesthesia Type: MAC Level of consciousness: awake and alert Pain management: pain level controlled Vital Signs Assessment: post-procedure vital signs reviewed and stable Respiratory status: spontaneous breathing, nonlabored ventilation, respiratory function stable and patient connected to nasal cannula oxygen Cardiovascular status: blood pressure returned to baseline and stable Postop Assessment: no apparent nausea or vomiting Anesthetic complications: no   No complications documented.  Last Vitals:  Vitals:   02/12/21 0840 02/12/21 0850  BP: (!) 163/86 (!) 168/86  Pulse: 76 73  Resp: 16 17  Temp:    SpO2: 100% 98%    Last Pain:  Vitals:   02/12/21 0850  TempSrc:   PainSc: 0-No pain                 Barnet Glasgow

## 2021-02-12 NOTE — H&P (Signed)
Bonnie [name]   HPI: This 65 year old black female presents to the office for a follow up. She continues to have acid reflux, coughing and dysphagia. 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 This did not cause any obstruction and barium tablet passed readily through the area, mild decreased esophageal motility, no hiatal hernia or reflux. She takes Dexilant for acid reflux. She has episodes of regurgitation and occasional epigastric pain that started a couple of weeks ago. She has problems with episodic coughing that started several years ago but the symptoms have continued to worsen over the last 6 months. She has difficulty swallowing solids and liquids which causes her to have a lot of coughing. She has nausea and vomiting when she has the continuous coughing. She has 1 BM per day with no obvious blood or mucus in the stool. She has good appetite and her weight is stable. She denies any complaints of abdominal pain or odynophagia. She denies having a family history of colon cancer, celiac sprue, or IBD. Her last colonoscopy done on 04/22/2014 revealed a normal colon; biopsies of the terminal ileum revealed focal chronic, active ileitis, rectal biopsies revealed normal rectal mucosa and random colonic biopsies were benign with no evidence of IBD or colitis.    Past Medical History:  Diagnosis Date  . Anemia   . Angina   . Anxiety   . Carpal tunnel syndrome 12/07/2015   Bilateral  . Diabetes mellitus   . GERD (gastroesophageal reflux disease)   . Hypercholesteremia   . Hypertension   . Obesity   . Wears dentures    full set    Past Surgical History:  Procedure Laterality Date  . ABDOMINAL HYSTERECTOMY    . 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 BLADDER SURGERY    . BRAIN SURGERY    . BRAIN SURGERY     For meningioma.  . COCCYX REMOVAL    . COLONOSCOPY Left 04/22/2014   Procedure:  COLONOSCOPY;  Surgeon: [name], MD;  Location: [facility];  Service: Endoscopy;  Laterality: Left;  . SHOULDER SURGERY    . TONSILLECTOMY      Family History  Problem Relation Age of Onset  . Diabetes Mother   . Lung cancer Father   . Diabetes Brother     Social History:  reports that she has quit smoking. She has never used smokeless tobacco. She reports current alcohol use. She reports that she does not use drugs.  Allergies:  Allergies  Allergen Reactions  . Clarithromycin Shortness Of Breath, Swelling, Anxiety and Other (See Comments)    Throat swelling Other Reaction: laryngeal edema  . Exenatide Anaphylaxis, Rash and Other (See Comments)     Caused Sweating, sores in the mouth  Also gum soreness/irritation   . Ibuprofen Rash and Other (See Comments)    Nose bleed  . Methocarbamol Anaphylaxis and Swelling    Swollen eyes  . Shellfish Allergy Nausea And Vomiting  . Ampicillin Nausea And Vomiting  . Carafate [Sucralfate] Nausea And Vomiting  . Cephalexin     Other reaction(s): breathing issues  . Cyanocobalamin   . Glyburide     Other reaction(s): ??  . Metronidazole Nausea And Vomiting, Nausea Only and Swelling    Pt stated, "Eyes swelled up"  . Prednisone     Other reaction(s): BS fluctuations  .  Pregabalin     Other reaction(s): disoriented  . Vitamin B12 Swelling  . Ketoconazole Nausea And Vomiting and Palpitations    Other reaction(s): Irregular Heart Rate   . Mango Flavor Itching and Rash  . Pineapple Rash  . Strawberry Flavor Itching    Medications:  Scheduled:  Continuous: . lactated ringers      No results found for this or any previous visit (from the past 24 hour(s)).   No results found.  ROS:  As stated above in the HPI otherwise negative.  Height 5\' 8"  (1.727 m), weight 89.8 kg.    PE: Gen: NAD, Alert and Oriented HEENT:  [place]/AT, EOMI Neck: Supple, no LAD Lungs: CTA Bilaterally CV: RRR without M/G/R ABD: Soft, NTND,  +BS Ext: No C/C/E  Assessment/Plan: 1) Dysphagia - EGD with dilation.  Bruna Dills D 02/12/2021, 7:24 AM

## 2021-02-14 ENCOUNTER — Encounter (HOSPITAL_COMMUNITY): Payer: Self-pay | Admitting: Gastroenterology

## 2021-03-10 DIAGNOSIS — D500 Iron deficiency anemia secondary to blood loss (chronic): Secondary | ICD-10-CM | POA: Diagnosis not present

## 2021-03-11 DIAGNOSIS — Z1211 Encounter for screening for malignant neoplasm of colon: Secondary | ICD-10-CM | POA: Diagnosis not present

## 2021-03-11 DIAGNOSIS — K219 Gastro-esophageal reflux disease without esophagitis: Secondary | ICD-10-CM | POA: Diagnosis not present

## 2021-03-11 DIAGNOSIS — D509 Iron deficiency anemia, unspecified: Secondary | ICD-10-CM | POA: Diagnosis not present

## 2021-03-11 DIAGNOSIS — D500 Iron deficiency anemia secondary to blood loss (chronic): Secondary | ICD-10-CM | POA: Diagnosis not present

## 2021-03-13 DIAGNOSIS — Z794 Long term (current) use of insulin: Secondary | ICD-10-CM | POA: Diagnosis not present

## 2021-03-13 DIAGNOSIS — E1165 Type 2 diabetes mellitus with hyperglycemia: Secondary | ICD-10-CM | POA: Diagnosis not present

## 2021-03-13 DIAGNOSIS — E782 Mixed hyperlipidemia: Secondary | ICD-10-CM | POA: Diagnosis not present

## 2021-03-13 DIAGNOSIS — I10 Essential (primary) hypertension: Secondary | ICD-10-CM | POA: Diagnosis not present

## 2021-03-15 DIAGNOSIS — E1165 Type 2 diabetes mellitus with hyperglycemia: Secondary | ICD-10-CM | POA: Diagnosis not present

## 2021-03-15 DIAGNOSIS — E782 Mixed hyperlipidemia: Secondary | ICD-10-CM | POA: Diagnosis not present

## 2021-03-15 DIAGNOSIS — I10 Essential (primary) hypertension: Secondary | ICD-10-CM | POA: Diagnosis not present

## 2021-03-24 DIAGNOSIS — K635 Polyp of colon: Secondary | ICD-10-CM | POA: Diagnosis not present

## 2021-03-24 DIAGNOSIS — Z1211 Encounter for screening for malignant neoplasm of colon: Secondary | ICD-10-CM | POA: Diagnosis not present

## 2021-03-24 DIAGNOSIS — D509 Iron deficiency anemia, unspecified: Secondary | ICD-10-CM | POA: Diagnosis not present

## 2021-03-24 DIAGNOSIS — D123 Benign neoplasm of transverse colon: Secondary | ICD-10-CM | POA: Diagnosis not present

## 2021-03-24 DIAGNOSIS — K5140 Inflammatory polyps of colon without complications: Secondary | ICD-10-CM | POA: Diagnosis not present

## 2021-04-13 DIAGNOSIS — I10 Essential (primary) hypertension: Secondary | ICD-10-CM | POA: Diagnosis not present

## 2021-04-13 DIAGNOSIS — E1165 Type 2 diabetes mellitus with hyperglycemia: Secondary | ICD-10-CM | POA: Diagnosis not present

## 2021-04-13 DIAGNOSIS — E782 Mixed hyperlipidemia: Secondary | ICD-10-CM | POA: Diagnosis not present

## 2021-04-13 DIAGNOSIS — Z794 Long term (current) use of insulin: Secondary | ICD-10-CM | POA: Diagnosis not present

## 2021-04-27 DIAGNOSIS — E118 Type 2 diabetes mellitus with unspecified complications: Secondary | ICD-10-CM | POA: Diagnosis not present

## 2021-04-27 DIAGNOSIS — E782 Mixed hyperlipidemia: Secondary | ICD-10-CM | POA: Diagnosis not present

## 2021-04-27 DIAGNOSIS — D500 Iron deficiency anemia secondary to blood loss (chronic): Secondary | ICD-10-CM | POA: Diagnosis not present

## 2021-04-28 DIAGNOSIS — M5134 Other intervertebral disc degeneration, thoracic region: Secondary | ICD-10-CM | POA: Diagnosis not present

## 2021-04-28 DIAGNOSIS — M5416 Radiculopathy, lumbar region: Secondary | ICD-10-CM | POA: Diagnosis not present

## 2021-04-28 DIAGNOSIS — M5414 Radiculopathy, thoracic region: Secondary | ICD-10-CM | POA: Diagnosis not present

## 2021-04-28 DIAGNOSIS — M5136 Other intervertebral disc degeneration, lumbar region: Secondary | ICD-10-CM | POA: Diagnosis not present

## 2021-05-04 DIAGNOSIS — M5416 Radiculopathy, lumbar region: Secondary | ICD-10-CM | POA: Diagnosis not present

## 2021-05-04 DIAGNOSIS — I10 Essential (primary) hypertension: Secondary | ICD-10-CM | POA: Diagnosis not present

## 2021-05-04 DIAGNOSIS — M150 Primary generalized (osteo)arthritis: Secondary | ICD-10-CM | POA: Diagnosis not present

## 2021-05-04 DIAGNOSIS — E782 Mixed hyperlipidemia: Secondary | ICD-10-CM | POA: Diagnosis not present

## 2021-05-04 DIAGNOSIS — E1165 Type 2 diabetes mellitus with hyperglycemia: Secondary | ICD-10-CM | POA: Diagnosis not present

## 2021-05-13 DIAGNOSIS — E1165 Type 2 diabetes mellitus with hyperglycemia: Secondary | ICD-10-CM | POA: Diagnosis not present

## 2021-05-13 DIAGNOSIS — E782 Mixed hyperlipidemia: Secondary | ICD-10-CM | POA: Diagnosis not present

## 2021-05-13 DIAGNOSIS — Z794 Long term (current) use of insulin: Secondary | ICD-10-CM | POA: Diagnosis not present

## 2021-05-13 DIAGNOSIS — I10 Essential (primary) hypertension: Secondary | ICD-10-CM | POA: Diagnosis not present

## 2021-05-21 ENCOUNTER — Ambulatory Visit: Payer: Medicare Other | Admitting: Podiatry

## 2021-05-21 ENCOUNTER — Other Ambulatory Visit: Payer: Self-pay

## 2021-05-21 DIAGNOSIS — M79675 Pain in left toe(s): Secondary | ICD-10-CM | POA: Diagnosis not present

## 2021-05-21 DIAGNOSIS — E1142 Type 2 diabetes mellitus with diabetic polyneuropathy: Secondary | ICD-10-CM

## 2021-05-21 DIAGNOSIS — B351 Tinea unguium: Secondary | ICD-10-CM

## 2021-05-21 DIAGNOSIS — L84 Corns and callosities: Secondary | ICD-10-CM

## 2021-05-21 DIAGNOSIS — M79674 Pain in right toe(s): Secondary | ICD-10-CM

## 2021-05-23 ENCOUNTER — Encounter: Payer: Self-pay | Admitting: Podiatry

## 2021-05-23 NOTE — Progress Notes (Signed)
Subjective:  Patient ID: Bonnie [name], female    DOB: 09-Dec-1955,  MRN: [id number]  65 y.o. female presents with preventative diabetic foot care and callus(es) b/l feet and painful thick toenails that are difficult to trim. Painful toenails interfere with ambulation. Aggravating factors include wearing enclosed shoe gear. Pain is relieved with periodic professional debridement. Painful calluses are aggravated when weightbearing with and without shoegear. Pain is relieved with periodic professional debridement..    Patient states she has been experiencing episodes of foot numbness in her digits. States her A1c was elevated to 8.1% from 7.1%. She states she will try to get it back under control.  PCP: [name], MD and last visit was: 11/17/2020.  Review of Systems: Negative except as noted in the HPI.   Allergies  Allergen Reactions   Clarithromycin Shortness Of Breath, Swelling, Anxiety and Other (See Comments)    Throat swelling Other Reaction: laryngeal edema   Exenatide Anaphylaxis, Rash and Other (See Comments)     Caused Sweating, sores in the mouth  Also gum soreness/irritation    Ibuprofen Rash and Other (See Comments)    Nose bleed   Methocarbamol Anaphylaxis and Swelling    Swollen eyes   Shellfish Allergy Nausea And Vomiting   Ampicillin Nausea And Vomiting   Carafate [Sucralfate] Nausea And Vomiting   Cephalexin     Other reaction(s): breathing issues   Cyanocobalamin    Glyburide     Other reaction(s): ??   Metronidazole Nausea And Vomiting, Nausea Only and Swelling    Pt stated, "Eyes swelled up"   Prednisone     Other reaction(s): BS fluctuations   Pregabalin     Other reaction(s): disoriented   Vitamin B12 Swelling   Ketoconazole Nausea And Vomiting and Palpitations    Other reaction(s): Irregular Heart Rate    Mango Flavor Itching and Rash   Pineapple Rash   Strawberry Flavor Itching    Objective:  There were no vitals filed for this  visit. Constitutional Patient is a pleasant 65 y.o. African American female in NAD. AAO x 3.  Vascular Capillary refill time to digits immediate b/l. Palpable DP pulse(s) b/l lower extremities Palpable PT pulse(s) b/l lower extremities Pedal hair sparse. Lower extremity skin temperature gradient within normal limits. No pain with calf compression b/l. No cyanosis or clubbing noted.  Neurologic Normal speech. Pt has subjective symptoms of neuropathy. Protective sensation intact 5/5 intact bilaterally with 10g monofilament b/l. Vibratory sensation intact b/l.  Dermatologic Pedal skin with normal turgor, texture and tone b/l lower extremities No open wounds b/l lower extremities No interdigital macerations b/l lower extremities Toenails 1-5 b/l elongated, discolored, dystrophic, thickened, crumbly with subungual debris and tenderness to dorsal palpation. Hyperkeratotic lesion(s) submet head 1 left foot, submet head 1 right foot, submet head 5 left foot, and submet head 5 right foot.  No erythema, no edema, no drainage, no fluctuance. She has benign macule right hallux nailbed confirmed by Dermatology.  Orthopedic: Normal muscle strength 5/5 to all lower extremity muscle groups bilaterally. No pain crepitus or joint limitation noted with ROM b/l. No gross bony deformities bilaterally.   Assessment:   1. Pain due to onychomycosis of toenails of both feet   2. Callus   3. Diabetic peripheral neuropathy associated with type 2 diabetes mellitus ([place])    Plan:  Patient was evaluated and treated and all questions answered.  Onychomycosis with pain -Nails palliatively debridement as below. -Educated on self-care  Procedure: Nail Debridement Rationale: Pain  Type of Debridement: manual, sharp debridement. Instrumentation: Nail nipper, rotary burr. Number of Nails: 10  -Examined patient. -Discussed symptoms of neuropathy are more prevalent with uncontrolled blood sugars. Discussed importance of  . -Patient to continue soft, supportive shoe gear daily. -Toenails 1-5 b/l were debrided in length and girth with sterile nail nippers and dremel without iatrogenic bleeding.  -Callus(es) submet head 1 left foot, submet head 1 right foot, submet head 5 left foot, and submet head 5 right foot pared utilizing sterile scalpel blade without complication or incident. Total number debrided =4. -Patient to report any pedal injuries to medical professional immediately. -Patient/POA to call should there be question/concern in the interim.  Return in about 3 months (around 08/21/2021) for diabetic nail and callus trim.  Marzetta Board, DPM

## 2021-05-27 DIAGNOSIS — H59031 Cystoid macular edema following cataract surgery, right eye: Secondary | ICD-10-CM | POA: Diagnosis not present

## 2021-05-27 DIAGNOSIS — H401131 Primary open-angle glaucoma, bilateral, mild stage: Secondary | ICD-10-CM | POA: Diagnosis not present

## 2021-05-27 DIAGNOSIS — Z794 Long term (current) use of insulin: Secondary | ICD-10-CM | POA: Diagnosis not present

## 2021-05-27 DIAGNOSIS — H3589 Other specified retinal disorders: Secondary | ICD-10-CM | POA: Diagnosis not present

## 2021-05-27 DIAGNOSIS — E113212 Type 2 diabetes mellitus with mild nonproliferative diabetic retinopathy with macular edema, left eye: Secondary | ICD-10-CM | POA: Diagnosis not present

## 2021-05-27 DIAGNOSIS — E113291 Type 2 diabetes mellitus with mild nonproliferative diabetic retinopathy without macular edema, right eye: Secondary | ICD-10-CM | POA: Diagnosis not present

## 2021-05-27 DIAGNOSIS — H0014 Chalazion left upper eyelid: Secondary | ICD-10-CM | POA: Diagnosis not present

## 2021-06-13 DIAGNOSIS — E782 Mixed hyperlipidemia: Secondary | ICD-10-CM | POA: Diagnosis not present

## 2021-06-13 DIAGNOSIS — I10 Essential (primary) hypertension: Secondary | ICD-10-CM | POA: Diagnosis not present

## 2021-06-13 DIAGNOSIS — Z794 Long term (current) use of insulin: Secondary | ICD-10-CM | POA: Diagnosis not present

## 2021-06-13 DIAGNOSIS — E1165 Type 2 diabetes mellitus with hyperglycemia: Secondary | ICD-10-CM | POA: Diagnosis not present

## 2021-07-05 DIAGNOSIS — J3489 Other specified disorders of nose and nasal sinuses: Secondary | ICD-10-CM | POA: Diagnosis not present

## 2021-07-15 DIAGNOSIS — E1165 Type 2 diabetes mellitus with hyperglycemia: Secondary | ICD-10-CM | POA: Diagnosis not present

## 2021-07-17 DIAGNOSIS — E1165 Type 2 diabetes mellitus with hyperglycemia: Secondary | ICD-10-CM | POA: Diagnosis not present

## 2021-07-27 DIAGNOSIS — I10 Essential (primary) hypertension: Secondary | ICD-10-CM | POA: Diagnosis not present

## 2021-07-27 DIAGNOSIS — E1165 Type 2 diabetes mellitus with hyperglycemia: Secondary | ICD-10-CM | POA: Diagnosis not present

## 2021-07-27 DIAGNOSIS — E782 Mixed hyperlipidemia: Secondary | ICD-10-CM | POA: Diagnosis not present

## 2021-08-02 DIAGNOSIS — H401131 Primary open-angle glaucoma, bilateral, mild stage: Secondary | ICD-10-CM | POA: Diagnosis not present

## 2021-08-03 ENCOUNTER — Other Ambulatory Visit: Payer: Self-pay

## 2021-08-03 DIAGNOSIS — E113293 Type 2 diabetes mellitus with mild nonproliferative diabetic retinopathy without macular edema, bilateral: Secondary | ICD-10-CM | POA: Diagnosis not present

## 2021-08-03 DIAGNOSIS — E782 Mixed hyperlipidemia: Secondary | ICD-10-CM | POA: Diagnosis not present

## 2021-08-03 DIAGNOSIS — I10 Essential (primary) hypertension: Secondary | ICD-10-CM | POA: Diagnosis not present

## 2021-08-03 DIAGNOSIS — Z794 Long term (current) use of insulin: Secondary | ICD-10-CM | POA: Diagnosis not present

## 2021-08-03 DIAGNOSIS — Z5181 Encounter for therapeutic drug level monitoring: Secondary | ICD-10-CM | POA: Diagnosis not present

## 2021-08-04 DIAGNOSIS — Z20822 Contact with and (suspected) exposure to covid-19: Secondary | ICD-10-CM | POA: Diagnosis not present

## 2021-08-04 DIAGNOSIS — U071 COVID-19: Secondary | ICD-10-CM | POA: Diagnosis not present

## 2021-08-10 DIAGNOSIS — Z20822 Contact with and (suspected) exposure to covid-19: Secondary | ICD-10-CM | POA: Diagnosis not present

## 2021-08-10 DIAGNOSIS — U071 COVID-19: Secondary | ICD-10-CM | POA: Diagnosis not present

## 2021-08-13 DIAGNOSIS — E782 Mixed hyperlipidemia: Secondary | ICD-10-CM | POA: Diagnosis not present

## 2021-08-13 DIAGNOSIS — E1165 Type 2 diabetes mellitus with hyperglycemia: Secondary | ICD-10-CM | POA: Diagnosis not present

## 2021-08-13 DIAGNOSIS — Z794 Long term (current) use of insulin: Secondary | ICD-10-CM | POA: Diagnosis not present

## 2021-08-13 DIAGNOSIS — I10 Essential (primary) hypertension: Secondary | ICD-10-CM | POA: Diagnosis not present

## 2021-08-14 DIAGNOSIS — E1165 Type 2 diabetes mellitus with hyperglycemia: Secondary | ICD-10-CM | POA: Diagnosis not present

## 2021-08-27 ENCOUNTER — Encounter: Payer: Self-pay | Admitting: Podiatry

## 2021-08-27 ENCOUNTER — Other Ambulatory Visit: Payer: Self-pay

## 2021-08-27 ENCOUNTER — Ambulatory Visit (INDEPENDENT_AMBULATORY_CARE_PROVIDER_SITE_OTHER): Payer: Medicare Other | Admitting: Podiatry

## 2021-08-27 DIAGNOSIS — L84 Corns and callosities: Secondary | ICD-10-CM | POA: Diagnosis not present

## 2021-08-27 DIAGNOSIS — E1142 Type 2 diabetes mellitus with diabetic polyneuropathy: Secondary | ICD-10-CM

## 2021-08-29 NOTE — Progress Notes (Signed)
Subjective:  Patient ID: Bonnie [name], female    DOB: 10/23/1956,  MRN: [id number]  65 y.o. female presents with preventative diabetic foot care and callus(es) b/l feet and painful thick toenails that are difficult to trim. Painful toenails interfere with ambulation. Aggravating factors include wearing enclosed shoe gear. Pain is relieved with periodic professional debridement. Painful calluses are aggravated when weightbearing with and without shoegear. Pain is relieved with periodic professional debridement..    Patient continues to c/o  foot numbness in her digits. She states her blood glucose was 129 mg/dl today.  Her husband is present during today's visit.  PCP: [name], MD and last visit was: 05/04/2021  Review of Systems: Negative except as noted in the HPI.   Allergies  Allergen Reactions   Clarithromycin Shortness Of Breath, Swelling, Anxiety and Other (See Comments)    Throat swelling Other Reaction: laryngeal edema Other reaction(s): breathing issues, Other (See Comments) Throat swelling Other Reaction: laryngeal edema Other reaction(s): Other (See Comments) Throat swelling Other Reaction: laryngeal edema Throat swelling Throat swelling Other Reaction: laryngeal edema    Exenatide Anaphylaxis, Rash and Other (See Comments)     Caused Sweating, sores in the mouth  Also gum soreness/irritation  Other reaction(s): Other (See Comments) Also gum soreness/irritation Caused Sweating, sores in the mouth  Caused Sweating, sores in the mouth  Also gum soreness/irritation  Caused Sweating, sores in the mouth  Also gum soreness/irritation Caused Sweating, sores in the mouth    Ibuprofen Rash and Other (See Comments)    Nose bleed   Methocarbamol Anaphylaxis and Swelling    Swollen eyes   Shellfish Allergy Nausea And Vomiting   Ampicillin Nausea And Vomiting   Carafate [Sucralfate] Nausea And Vomiting   Cephalexin     Other reaction(s): breathing  issues Other reaction(s): breathing issues   Cyanocobalamin    Glyburide     Other reaction(s): ?? Other reaction(s): ??   Metronidazole Nausea And Vomiting, Nausea Only and Swelling    Pt stated, "Eyes swelled up"   Prednisone     Other reaction(s): BS fluctuations Other reaction(s): BS fluctuations   Pregabalin     Other reaction(s): disoriented Other reaction(s): disoriented   Vitamin B12 Swelling   Ketoconazole Nausea And Vomiting and Palpitations    Other reaction(s): Irregular Heart Rate  Other reaction(s): Other (See Comments) Other reaction(s): Irregular Heart Rate Other reaction(s): Irregular Heart Rate   Mango Flavor Itching and Rash   Pineapple Rash   Strawberry Flavor Itching    Objective:  There were no vitals filed for this visit. Constitutional Patient is a pleasant 65 y.o. African American female in NAD. AAO x 3.  Vascular Capillary refill time to digits immediate b/l. Palpable DP pulse(s) b/l lower extremities Palpable PT pulse(s) b/l lower extremities Pedal hair sparse. Lower extremity skin temperature gradient within normal limits. No pain with calf compression b/l. No cyanosis or clubbing noted.  Neurologic Normal speech. Pt has subjective symptoms of neuropathy. Protective sensation intact 5/5 intact bilaterally with 10g monofilament b/l. Vibratory sensation intact b/l.  Dermatologic Pedal skin with normal turgor, texture and tone b/l lower extremities. No open wounds b/l lower extremities. No interdigital macerations b/l lower extremities. Toenails b/l feet well maintained with adequate length. No erythema, no edema, no drainage, no fluctuance. Hyperkeratotic lesion(s) submet head 1 left foot, submet head 1 right foot, submet head 5 left foot, and submet head 5 right foot.  No erythema, no edema, no drainage, no fluctuance. She has benign  macule right hallux nailbed confirmed by Dermatology.  Orthopedic: Normal muscle strength 5/5 to all lower extremity muscle  groups bilaterally. No pain crepitus or joint limitation noted with ROM b/l. No gross bony deformities bilaterally.   Assessment:   1. Callus   2. Diabetic peripheral neuropathy associated with type 2 diabetes mellitus ([place])    Plan:  -Examined patient. -Discussed symptoms of neuropathy are more prevalent with uncontrolled blood sugars. Discussed importance of adhering to diet as prescribed by PCP or Endocrinologist . -Patient to continue soft, supportive shoe gear daily. -Callus(es) submet head 1 left foot, submet head 1 right foot, submet head 5 left foot, and submet head 5 right foot pared utilizing sterile scalpel blade without complication or incident. Total number debrided =4. -Patient to report any pedal injuries to medical professional immediately. -Patient/POA to call should there be question/concern in the interim.  Return in about 3 months (around 11/27/2021).  Marzetta Board, DPM

## 2021-08-30 DIAGNOSIS — Z961 Presence of intraocular lens: Secondary | ICD-10-CM | POA: Diagnosis not present

## 2021-08-30 DIAGNOSIS — H401131 Primary open-angle glaucoma, bilateral, mild stage: Secondary | ICD-10-CM | POA: Diagnosis not present

## 2021-09-02 DIAGNOSIS — Z23 Encounter for immunization: Secondary | ICD-10-CM | POA: Diagnosis not present

## 2021-09-13 DIAGNOSIS — I10 Essential (primary) hypertension: Secondary | ICD-10-CM | POA: Diagnosis not present

## 2021-09-13 DIAGNOSIS — E1165 Type 2 diabetes mellitus with hyperglycemia: Secondary | ICD-10-CM | POA: Diagnosis not present

## 2021-09-13 DIAGNOSIS — E782 Mixed hyperlipidemia: Secondary | ICD-10-CM | POA: Diagnosis not present

## 2021-09-13 DIAGNOSIS — Z794 Long term (current) use of insulin: Secondary | ICD-10-CM | POA: Diagnosis not present

## 2021-09-14 DIAGNOSIS — E1165 Type 2 diabetes mellitus with hyperglycemia: Secondary | ICD-10-CM | POA: Diagnosis not present

## 2021-09-17 DIAGNOSIS — Z1231 Encounter for screening mammogram for malignant neoplasm of breast: Secondary | ICD-10-CM | POA: Diagnosis not present

## 2021-09-17 DIAGNOSIS — Z23 Encounter for immunization: Secondary | ICD-10-CM | POA: Diagnosis not present

## 2021-09-21 DIAGNOSIS — H0288A Meibomian gland dysfunction right eye, upper and lower eyelids: Secondary | ICD-10-CM | POA: Insufficient documentation

## 2021-10-13 DIAGNOSIS — I10 Essential (primary) hypertension: Secondary | ICD-10-CM | POA: Diagnosis not present

## 2021-10-13 DIAGNOSIS — K219 Gastro-esophageal reflux disease without esophagitis: Secondary | ICD-10-CM | POA: Diagnosis not present

## 2021-10-13 DIAGNOSIS — E1159 Type 2 diabetes mellitus with other circulatory complications: Secondary | ICD-10-CM | POA: Diagnosis not present

## 2021-10-13 DIAGNOSIS — I2510 Atherosclerotic heart disease of native coronary artery without angina pectoris: Secondary | ICD-10-CM | POA: Diagnosis not present

## 2021-10-14 DIAGNOSIS — E1165 Type 2 diabetes mellitus with hyperglycemia: Secondary | ICD-10-CM | POA: Diagnosis not present

## 2021-10-20 DIAGNOSIS — H0288B Meibomian gland dysfunction left eye, upper and lower eyelids: Secondary | ICD-10-CM | POA: Diagnosis not present

## 2021-10-20 DIAGNOSIS — Z794 Long term (current) use of insulin: Secondary | ICD-10-CM | POA: Diagnosis not present

## 2021-10-20 DIAGNOSIS — E113291 Type 2 diabetes mellitus with mild nonproliferative diabetic retinopathy without macular edema, right eye: Secondary | ICD-10-CM | POA: Diagnosis not present

## 2021-10-20 DIAGNOSIS — H401131 Primary open-angle glaucoma, bilateral, mild stage: Secondary | ICD-10-CM | POA: Diagnosis not present

## 2021-10-20 DIAGNOSIS — E113212 Type 2 diabetes mellitus with mild nonproliferative diabetic retinopathy with macular edema, left eye: Secondary | ICD-10-CM | POA: Diagnosis not present

## 2021-10-20 DIAGNOSIS — H0288A Meibomian gland dysfunction right eye, upper and lower eyelids: Secondary | ICD-10-CM | POA: Diagnosis not present

## 2021-10-20 DIAGNOSIS — Z961 Presence of intraocular lens: Secondary | ICD-10-CM | POA: Diagnosis not present

## 2021-11-14 DIAGNOSIS — E1165 Type 2 diabetes mellitus with hyperglycemia: Secondary | ICD-10-CM | POA: Diagnosis not present

## 2021-11-30 DIAGNOSIS — D500 Iron deficiency anemia secondary to blood loss (chronic): Secondary | ICD-10-CM | POA: Diagnosis not present

## 2021-11-30 DIAGNOSIS — E118 Type 2 diabetes mellitus with unspecified complications: Secondary | ICD-10-CM | POA: Diagnosis not present

## 2021-11-30 DIAGNOSIS — Z794 Long term (current) use of insulin: Secondary | ICD-10-CM | POA: Diagnosis not present

## 2021-11-30 DIAGNOSIS — E782 Mixed hyperlipidemia: Secondary | ICD-10-CM | POA: Diagnosis not present

## 2021-11-30 DIAGNOSIS — E113293 Type 2 diabetes mellitus with mild nonproliferative diabetic retinopathy without macular edema, bilateral: Secondary | ICD-10-CM | POA: Diagnosis not present

## 2021-11-30 DIAGNOSIS — I10 Essential (primary) hypertension: Secondary | ICD-10-CM | POA: Diagnosis not present

## 2021-11-30 DIAGNOSIS — Z5181 Encounter for therapeutic drug level monitoring: Secondary | ICD-10-CM | POA: Diagnosis not present

## 2021-12-03 ENCOUNTER — Other Ambulatory Visit: Payer: Self-pay

## 2021-12-03 ENCOUNTER — Ambulatory Visit (INDEPENDENT_AMBULATORY_CARE_PROVIDER_SITE_OTHER): Payer: Medicare Other | Admitting: Podiatry

## 2021-12-03 DIAGNOSIS — E1142 Type 2 diabetes mellitus with diabetic polyneuropathy: Secondary | ICD-10-CM | POA: Diagnosis not present

## 2021-12-03 DIAGNOSIS — L84 Corns and callosities: Secondary | ICD-10-CM | POA: Diagnosis not present

## 2021-12-03 NOTE — Progress Notes (Signed)
ANNUAL DIABETIC FOOT EXAM  Subjective: Bonnie [name] presents today for for annual diabetic foot examination.  Patient relates 66 year h/o diabetes.  Patient denies any h/o foot wounds.  Patient has been diagnosed with neuropathy. She elates improvement in symptoms with B-12 Complete and Zinc supplements.  Patient's blood sugar was 154 mg/dl today.   Risk factors: diabetes, hyperlipidemia.  [name], MD is patient's PCP. Last visit was 11/30/2021.  Past Medical History:  Diagnosis Date   Anemia    Angina    Anxiety    Carpal tunnel syndrome 12/07/2015   Bilateral   Cyst of brain 2005   Diabetes mellitus    GERD (gastroesophageal reflux disease)    Hypercholesteremia    Hypertension    Obesity    Wears dentures    full set   Patient Active Problem List   Diagnosis Date Noted   Degeneration of lumbar intervertebral disc 12/11/2021   Diabetic renal disease ([place]) 12/11/2021   Adult general medical exam 12/11/2021   Hardening of the aorta (main artery of the heart) ([place]) 12/11/2021   Hordeolum externum of right upper eyelid 08/20/2020   Glaucoma 06/23/2020   History of spinal fusion 11/27/2019   Chalazion of left upper eyelid 06/27/2019   Primary open angle glaucoma of both eyes, mild stage 06/27/2019   Hypertensive disorder 04/23/2019   Cervical radiculopathy 04/24/2018   Cystoid macular edema following cataract surgery, right eye 05/04/2017   Cervical pain 03/07/2016   Sickle cell trait ([place]) 03/07/2016   Carpal tunnel syndrome 12/07/2015   Pain in female genitalia on intercourse 09/17/2014   Nausea with vomiting 04/22/2014   Diarrhea 04/22/2014   Essential hypertension, benign 04/20/2014   Ileitis 04/19/2014   Cataract 12/25/2013   Dry eye syndrome 12/25/2013   Chronic LBP 02/27/2013   Hernia, rectovaginal 02/12/2013   Dysfunctional voiding of urine 02/12/2013   Bladder cystocele 12/06/2012   Diabetes mellitus ([place]) 12/06/2012   HLD (hyperlipidemia)  12/06/2012   Benign neoplasm of meninges ([place]) 12/06/2012   Mixed incontinence 12/06/2012   Open angle with borderline findings, high risk 10/17/2011   Chest pain, atypical 10/01/2011   Shortness of breath 10/01/2011   DM type 2 (diabetes mellitus, type 2) ([place]) 10/01/2011   Type 2 diabetes mellitus with both eyes affected by mild nonproliferative retinopathy without macular edema, with [place]-term current use of insulin ([place]) 10/01/2011   Hypercholesteremia    Anemia    Obesity    Anxiety    GERD (gastroesophageal reflux disease)    Past Surgical History:  Procedure Laterality Date   ABDOMINAL HYSTERECTOMY     BACK SURGERY     2001 for DJD   BLADDER SURGERY     BRAIN SURGERY     BRAIN SURGERY     For meningioma.   COCCYX REMOVAL     COLONOSCOPY Left 04/22/2014   Procedure: COLONOSCOPY;  Surgeon: [name], MD;  Location: [facility];  Service: Endoscopy;  Laterality: Left;   ESOPHAGOGASTRODUODENOSCOPY (EGD) WITH PROPOFOL N/A 02/12/2021   Procedure: ESOPHAGOGASTRODUODENOSCOPY (EGD) WITH PROPOFOL;  Surgeon: Carol Ada, MD;  Location: [facility];  Service: Endoscopy;  Laterality: N/A;   SAVORY DILATION N/A 02/12/2021   Procedure: SAVORY DILATION;  Surgeon: Carol Ada, MD;  Location: [facility];  Service: Endoscopy;  Laterality: N/A;   SHOULDER SURGERY     TONSILLECTOMY     Current Outpatient Medications on File Prior to Visit  Medication Sig Dispense Refill   acetaminophen (TYLENOL) 500 MG  tablet Take 250-500 mg by mouth See admin instructions. Take 250 mg by mouth in the morning, 500 mg in the afternoon at 1:00 pm and 250 mg with dinner     amitriptyline (ELAVIL) 10 MG tablet amitriptyline 10 mg tablet  1 TABLET AT BEDTIME,FOR NEUROPATHY ORALLY ONCE A DAY 30 DAY(S)     ascorbic acid (VITAMIN C) 1000 MG tablet Take by mouth.     azithromycin (ZITHROMAX) 250 MG tablet TAKE 2 TABLETS BY MOUTH TODAY, THEN TAKE 1 TABLET DAILY FOR 4 DAYS     Blood Glucose Monitoring Suppl  (FIFTY50 GLUCOSE METER 2.0) w/Device KIT OneTouch UltraMini kit  USE AS DIRECTED TO CHECK BLOOD SUGARS TWICE A DAY     carboxymethylcellulose (REFRESH PLUS) 0.5 % SOLN Place 1 drop into both eyes 3 (three) times daily as needed.     clonazePAM (KLONOPIN) 1 MG tablet Take 1 mg by mouth at bedtime.     clotrimazole-betamethasone (LOTRISONE) cream Apply 1 application topically daily as needed (irritation).     dapagliflozin propanediol (FARXIGA) 5 MG TABS tablet Take 5 mg by mouth in the morning.     dexlansoprazole (DEXILANT) 60 MG capsule Take 60 mg by mouth in the morning.     fluticasone (FLONASE) 50 MCG/ACT nasal spray Place 1 spray into both nostrils daily.     HYDROcodone-acetaminophen (NORCO) 5-325 MG per tablet Take 1 tablet by mouth at bedtime.     insulin glargine (LANTUS) 100 UNIT/ML injection Inject 11 Units into the skin at bedtime.     Insulin Pen Needle 31G X 5 MM MISC USE TWICE A DAY     Lancets (ONETOUCH ULTRASOFT) lancets OneTouch UltraSoft Lancets     liraglutide (VICTOZA) 18 MG/3ML SOPN Inject 7.2 mg into the skin daily.     metFORMIN (GLUCOPHAGE) 1000 MG tablet Take 1,000 mg by mouth 2 (two) times daily with a meal.     ONETOUCH VERIO test strip 2 (two) times daily. use for testing     ramipril (ALTACE) 5 MG capsule Take 5 mg by mouth in the morning.     simvastatin (ZOCOR) 40 MG tablet Take 20 mg by mouth daily at 6 PM.     timolol (TIMOPTIC) 0.5 % ophthalmic solution Place 1 drop into both eyes 2 (two) times daily.     TURMERIC PO Take 1 capsule by mouth in the morning.     valACYclovir (VALTREX) 1000 MG tablet Take 500 mg by mouth in the morning.     vitamin C (ASCORBIC ACID) 500 MG tablet Take 500 mg by mouth daily.     Vitamin D, Cholecalciferol, 25 MCG (1000 UT) TABS Take 1,000 Units by mouth daily.     No current facility-administered medications on file prior to visit.    Allergies  Allergen Reactions   Clarithromycin Shortness Of Breath, Swelling, Anxiety and  Other (See Comments)    Throat swelling Other Reaction: laryngeal edema Other reaction(s): breathing issues, Other (See Comments) Throat swelling Other Reaction: laryngeal edema Other reaction(s): Other (See Comments) Throat swelling Other Reaction: laryngeal edema Throat swelling Throat swelling Other Reaction: laryngeal edema    Exenatide Anaphylaxis, Rash and Other (See Comments)     Caused Sweating, sores in the mouth  Also gum soreness/irritation  Other reaction(s): Other (See Comments) Also gum soreness/irritation Caused Sweating, sores in the mouth  Caused Sweating, sores in the mouth  Also gum soreness/irritation  Caused Sweating, sores in the mouth  Also gum soreness/irritation Caused Sweating, sores  in the mouth    Ibuprofen Rash and Other (See Comments)    Nose bleed   Methocarbamol Anaphylaxis and Swelling    Swollen eyes   Shellfish Allergy Nausea And Vomiting   Ampicillin Nausea And Vomiting   Carafate [Sucralfate] Nausea And Vomiting   Cephalexin     Other reaction(s): breathing issues Other reaction(s): breathing issues   Cyanocobalamin    Glyburide     Other reaction(s): ?? Other reaction(s): ??   Metronidazole Nausea And Vomiting, Nausea Only and Swelling    Pt stated, "Eyes swelled up"   Prednisone     Other reaction(s): BS fluctuations Other reaction(s): BS fluctuations   Pregabalin     Other reaction(s): disoriented Other reaction(s): disoriented   Vitamin B12 Swelling   Ketoconazole Nausea And Vomiting and Palpitations    Other reaction(s): Irregular Heart Rate  Other reaction(s): Other (See Comments) Other reaction(s): Irregular Heart Rate Other reaction(s): Irregular Heart Rate   Mango Flavor Itching and Rash   Pineapple Rash   Strawberry Flavor Itching   Social History   Occupational History   Not on file  Tobacco Use   Smoking status: Former    Packs/day: 0.50    Years: 26.00    Pack years: 13.00    Types: Cigarettes     Quit date: 1998    Years since quitting: 25.0   Smokeless tobacco: Never  Substance and Sexual Activity   Alcohol use: Yes   Drug use: No   Sexual activity: Not on file   Family History  Problem Relation Age of Onset   Diabetes Mother    Lung cancer Father    Diabetes Brother    Immunization History  Administered Date(s) Administered   Influenza-Unspecified 08/14/2018   PFIZER(Purple Top)SARS-COV-2 Vaccination 01/13/2020, 02/04/2020     Review of Systems: Negative except as noted in the HPI.   Objective: There were no vitals filed for this visit.  Bonnie [name] is a pleasant 66 y.o. female in NAD. AAO X 3.  Vascular Examination: CFT immediate b/l LE. Palpable DP/PT pulses b/l LE. Digital hair sparse b/l. Skin temperature gradient WNL b/l. No pain with calf compression b/l. No edema noted b/l. No cyanosis or clubbing noted b/l LE.  Dermatological Examination: Pedal integument with normal turgor, texture and tone BLE. No open wounds b/l LE. No interdigital macerations noted b/l LE. Toenails 1-5 b/l well maintained with adequate length. No erythema, no edema, no drainage, no fluctuance. Hyperkeratotic lesion(s) submet head 1 b/l and submet head 5 b/l.  No erythema, no edema, no drainage, no fluctuance.  Musculoskeletal Examination: Normal muscle strength 5/5 to all lower extremity muscle groups bilaterally. No pain, crepitus or joint limitation noted with ROM b/l LE. No gross bony pedal deformities b/l. Patient ambulates independently without assistive aids.  Footwear Assessment: Does the patient wear appropriate shoes? Yes. Does the patient need inserts/orthotics? Yes.  Neurological Examination: Pt has subjective symptoms of neuropathy. Protective sensation intact 5/5 intact bilaterally with 10g monofilament b/l.  Assessment: 1. Callus   2. Diabetic peripheral neuropathy associated with type 2 diabetes mellitus ([facility])      ADA Risk Categorization: Low Risk :  Patient has  all of the following: Intact protective sensation No prior foot ulcer  No severe deformity Pedal pulses present  Plan: -Diabetic foot examination performed today. -Continue foot and shoe inspections daily. Monitor blood glucose per PCP/Endocrinologist's recommendations. -Callus(es) submet head 1 b/l and submet head 5 b/l pared utilizing sterile scalpel blade  without complication or incident. Total number debrided =4. -Patient/POA to call should there be question/concern in the interim.  Return in about 3 months (around 03/03/2022).  Marzetta Board, DPM

## 2021-12-07 DIAGNOSIS — I10 Essential (primary) hypertension: Secondary | ICD-10-CM | POA: Diagnosis not present

## 2021-12-07 DIAGNOSIS — E782 Mixed hyperlipidemia: Secondary | ICD-10-CM | POA: Diagnosis not present

## 2021-12-07 DIAGNOSIS — I700 Atherosclerosis of aorta: Secondary | ICD-10-CM | POA: Diagnosis not present

## 2021-12-07 DIAGNOSIS — E113293 Type 2 diabetes mellitus with mild nonproliferative diabetic retinopathy without macular edema, bilateral: Secondary | ICD-10-CM | POA: Diagnosis not present

## 2021-12-07 DIAGNOSIS — Z794 Long term (current) use of insulin: Secondary | ICD-10-CM | POA: Diagnosis not present

## 2021-12-09 DIAGNOSIS — H401132 Primary open-angle glaucoma, bilateral, moderate stage: Secondary | ICD-10-CM | POA: Diagnosis not present

## 2021-12-11 ENCOUNTER — Encounter: Payer: Self-pay | Admitting: Podiatry

## 2021-12-11 DIAGNOSIS — E1121 Type 2 diabetes mellitus with diabetic nephropathy: Secondary | ICD-10-CM | POA: Insufficient documentation

## 2021-12-11 DIAGNOSIS — Z0000 Encounter for general adult medical examination without abnormal findings: Secondary | ICD-10-CM | POA: Insufficient documentation

## 2021-12-11 DIAGNOSIS — I700 Atherosclerosis of aorta: Secondary | ICD-10-CM | POA: Insufficient documentation

## 2021-12-11 DIAGNOSIS — M5136 Other intervertebral disc degeneration, lumbar region: Secondary | ICD-10-CM | POA: Insufficient documentation

## 2021-12-11 NOTE — Progress Notes (Signed)
ANNUAL DIABETIC FOOT EXAM  Subjective: Bonnie [name] presents today for for annual diabetic foot examination.  Patient relates 66 year h/o diabetes.  Patient denies any h/o foot wounds.  Patient has been diagnosed with neuropathy. She elates improvement in symptoms with B-12 Complete and Zinc supplements.  Patient's blood sugar was 154 mg/dl today.   Risk factors:  atherosclerosis of aorta, diabetes, hyperlipidemia, HTN.  She is accompanied by her husband on today's visit.  [name], MD is patient's PCP. Last visit was 11/30/2021.  Past Medical History:  Diagnosis Date   Anemia    Angina    Anxiety    Carpal tunnel syndrome 12/07/2015   Bilateral   Cyst of brain 2005   Diabetes mellitus    GERD (gastroesophageal reflux disease)    Hypercholesteremia    Hypertension    Obesity    Wears dentures    full set   Patient Active Problem List   Diagnosis Date Noted   Degeneration of lumbar intervertebral disc 12/11/2021   Diabetic renal disease ([place]) 12/11/2021   Adult general medical exam 12/11/2021   Hardening of the aorta (main artery of the heart) ([place]) 12/11/2021   Hordeolum externum of right upper eyelid 08/20/2020   Glaucoma 06/23/2020   History of spinal fusion 11/27/2019   Chalazion of left upper eyelid 06/27/2019   Primary open angle glaucoma of both eyes, mild stage 06/27/2019   Hypertensive disorder 04/23/2019   Cervical radiculopathy 04/24/2018   Cystoid macular edema following cataract surgery, right eye 05/04/2017   Cervical pain 03/07/2016   Sickle cell trait ([place]) 03/07/2016   Carpal tunnel syndrome 12/07/2015   Pain in female genitalia on intercourse 09/17/2014   Nausea with vomiting 04/22/2014   Diarrhea 04/22/2014   Essential hypertension, benign 04/20/2014   Ileitis 04/19/2014   Cataract 12/25/2013   Dry eye syndrome 12/25/2013   Chronic LBP 02/27/2013   Hernia, rectovaginal 02/12/2013   Dysfunctional voiding of urine 02/12/2013    Bladder cystocele 12/06/2012   Diabetes mellitus ([place]) 12/06/2012   HLD (hyperlipidemia) 12/06/2012   Benign neoplasm of meninges ([place]) 12/06/2012   Mixed incontinence 12/06/2012   Open angle with borderline findings, high risk 10/17/2011   Chest pain, atypical 10/01/2011   Shortness of breath 10/01/2011   DM type 2 (diabetes mellitus, type 2) ([place]) 10/01/2011   Type 2 diabetes mellitus with both eyes affected by mild nonproliferative retinopathy without macular edema, with Carvin-term current use of insulin ([place]) 10/01/2011   Hypercholesteremia    Anemia    Obesity    Anxiety    GERD (gastroesophageal reflux disease)    Past Surgical History:  Procedure Laterality Date   ABDOMINAL HYSTERECTOMY     BACK SURGERY     2001 for DJD   BLADDER SURGERY     BRAIN SURGERY     BRAIN SURGERY     For meningioma.   COCCYX REMOVAL     COLONOSCOPY Left 04/22/2014   Procedure: COLONOSCOPY;  Surgeon: [name], MD;  Location: [facility];  Service: Endoscopy;  Laterality: Left;   ESOPHAGOGASTRODUODENOSCOPY (EGD) WITH PROPOFOL N/A 02/12/2021   Procedure: ESOPHAGOGASTRODUODENOSCOPY (EGD) WITH PROPOFOL;  Surgeon: Carol Ada, MD;  Location: [facility];  Service: Endoscopy;  Laterality: N/A;   SAVORY DILATION N/A 02/12/2021   Procedure: SAVORY DILATION;  Surgeon: Carol Ada, MD;  Location: [facility];  Service: Endoscopy;  Laterality: N/A;   SHOULDER SURGERY     TONSILLECTOMY     Current Outpatient Medications on  File Prior to Visit  Medication Sig Dispense Refill   acetaminophen (TYLENOL) 500 MG tablet Take 250-500 mg by mouth See admin instructions. Take 250 mg by mouth in the morning, 500 mg in the afternoon at 1:00 pm and 250 mg with dinner     amitriptyline (ELAVIL) 10 MG tablet amitriptyline 10 mg tablet  1 TABLET AT BEDTIME,FOR NEUROPATHY ORALLY ONCE A DAY 30 DAY(S)     ascorbic acid (VITAMIN C) 1000 MG tablet Take by mouth.     azithromycin (ZITHROMAX) 250 MG tablet TAKE 2 TABLETS  BY MOUTH TODAY, THEN TAKE 1 TABLET DAILY FOR 4 DAYS     Blood Glucose Monitoring Suppl (FIFTY50 GLUCOSE METER 2.0) w/Device KIT OneTouch UltraMini kit  USE AS DIRECTED TO CHECK BLOOD SUGARS TWICE A DAY     carboxymethylcellulose (REFRESH PLUS) 0.5 % SOLN Place 1 drop into both eyes 3 (three) times daily as needed.     clonazePAM (KLONOPIN) 1 MG tablet Take 1 mg by mouth at bedtime.     clotrimazole-betamethasone (LOTRISONE) cream Apply 1 application topically daily as needed (irritation).     dapagliflozin propanediol (FARXIGA) 5 MG TABS tablet Take 5 mg by mouth in the morning.     dexlansoprazole (DEXILANT) 60 MG capsule Take 60 mg by mouth in the morning.     fluticasone (FLONASE) 50 MCG/ACT nasal spray Place 1 spray into both nostrils daily.     HYDROcodone-acetaminophen (NORCO) 5-325 MG per tablet Take 1 tablet by mouth at bedtime.     insulin glargine (LANTUS) 100 UNIT/ML injection Inject 11 Units into the skin at bedtime.     Insulin Pen Needle 31G X 5 MM MISC USE TWICE A DAY     Lancets (ONETOUCH ULTRASOFT) lancets OneTouch UltraSoft Lancets     liraglutide (VICTOZA) 18 MG/3ML SOPN Inject 7.2 mg into the skin daily.     metFORMIN (GLUCOPHAGE) 1000 MG tablet Take 1,000 mg by mouth 2 (two) times daily with a meal.     ONETOUCH VERIO test strip 2 (two) times daily. use for testing     ramipril (ALTACE) 5 MG capsule Take 5 mg by mouth in the morning.     simvastatin (ZOCOR) 40 MG tablet Take 20 mg by mouth daily at 6 PM.     timolol (TIMOPTIC) 0.5 % ophthalmic solution Place 1 drop into both eyes 2 (two) times daily.     TURMERIC PO Take 1 capsule by mouth in the morning.     valACYclovir (VALTREX) 1000 MG tablet Take 500 mg by mouth in the morning.     vitamin C (ASCORBIC ACID) 500 MG tablet Take 500 mg by mouth daily.     Vitamin D, Cholecalciferol, 25 MCG (1000 UT) TABS Take 1,000 Units by mouth daily.     No current facility-administered medications on file prior to visit.     Allergies  Allergen Reactions   Clarithromycin Shortness Of Breath, Swelling, Anxiety and Other (See Comments)    Throat swelling Other Reaction: laryngeal edema Other reaction(s): breathing issues, Other (See Comments) Throat swelling Other Reaction: laryngeal edema Other reaction(s): Other (See Comments) Throat swelling Other Reaction: laryngeal edema Throat swelling Throat swelling Other Reaction: laryngeal edema    Exenatide Anaphylaxis, Rash and Other (See Comments)     Caused Sweating, sores in the mouth  Also gum soreness/irritation  Other reaction(s): Other (See Comments) Also gum soreness/irritation Caused Sweating, sores in the mouth  Caused Sweating, sores in the mouth  Also gum  soreness/irritation  Caused Sweating, sores in the mouth  Also gum soreness/irritation Caused Sweating, sores in the mouth    Ibuprofen Rash and Other (See Comments)    Nose bleed   Methocarbamol Anaphylaxis and Swelling    Swollen eyes   Shellfish Allergy Nausea And Vomiting   Ampicillin Nausea And Vomiting   Carafate [Sucralfate] Nausea And Vomiting   Cephalexin     Other reaction(s): breathing issues Other reaction(s): breathing issues   Cyanocobalamin    Glyburide     Other reaction(s): ?? Other reaction(s): ??   Metronidazole Nausea And Vomiting, Nausea Only and Swelling    Pt stated, "Eyes swelled up"   Prednisone     Other reaction(s): BS fluctuations Other reaction(s): BS fluctuations   Pregabalin     Other reaction(s): disoriented Other reaction(s): disoriented   Vitamin B12 Swelling   Ketoconazole Nausea And Vomiting and Palpitations    Other reaction(s): Irregular Heart Rate  Other reaction(s): Other (See Comments) Other reaction(s): Irregular Heart Rate Other reaction(s): Irregular Heart Rate   Mango Flavor Itching and Rash   Pineapple Rash   Strawberry Flavor Itching   Social History   Occupational History   Not on file  Tobacco Use   Smoking status:  Former    Packs/day: 0.50    Years: 26.00    Pack years: 13.00    Types: Cigarettes    Quit date: 1998    Years since quitting: 25.0   Smokeless tobacco: Never  Substance and Sexual Activity   Alcohol use: Yes   Drug use: No   Sexual activity: Not on file   Family History  Problem Relation Age of Onset   Diabetes Mother    Lung cancer Father    Diabetes Brother    Immunization History  Administered Date(s) Administered   Influenza-Unspecified 08/14/2018   PFIZER(Purple Top)SARS-COV-2 Vaccination 01/13/2020, 02/04/2020     Review of Systems: Negative except as noted in the HPI.   Objective: There were no vitals filed for this visit.  Corleen Otwell Benkert is a pleasant 66 y.o. female in NAD. AAO X 3.  Vascular Examination: CFT immediate b/l LE. Palpable DP/PT pulses b/l LE. Digital hair sparse b/l. Skin temperature gradient WNL b/l. No pain with calf compression b/l. No edema noted b/l. No cyanosis or clubbing noted b/l LE.  Dermatological Examination: Pedal integument with normal turgor, texture and tone BLE. No open wounds b/l LE. No interdigital macerations noted b/l LE. Toenails 1-5 b/l well maintained with adequate length. No erythema, no edema, no drainage, no fluctuance. Hyperkeratotic lesion(s) submet head 1 b/l and submet head 5 b/l.  No erythema, no edema, no drainage, no fluctuance.  Musculoskeletal Examination: Normal muscle strength 5/5 to all lower extremity muscle groups bilaterally. No pain, crepitus or joint limitation noted with ROM b/l LE. No gross bony pedal deformities b/l. Patient ambulates independently without assistive aids.  Footwear Assessment: Does the patient wear appropriate shoes? Yes. Does the patient need inserts/orthotics? Yes.  Neurological Examination: Pt has subjective symptoms of neuropathy. Protective sensation intact 5/5 intact bilaterally with 10g monofilament b/l.  No flowsheet data found.  Assessment: 1. Callus   2. Diabetic  peripheral neuropathy associated with type 2 diabetes mellitus (HCC)      ADA Risk Categorization: Low Risk :  Patient has all of the following: Intact protective sensation No prior foot ulcer  No severe deformity Pedal pulses present  Plan: -No new findings. No new orders. -She has improvement in neuropathy symptoms  with use of B-12 Complete and Zinc. -Continue diabetic foot care principles: inspect feet daily, monitor glucose as recommended by PCP and/or Endocrinologist, and follow prescribed diet per PCP, Endocrinologist and/or dietician. -Callus(es) submet head 1 b/l and submet head 5 b/l pared utilizing sterile scalpel blade without complication or incident. Total number debrided =4. -Patient/POA to call should there be question/concern in the interim.  Return in about 3 months (around 03/03/2022).  Marzetta Board, DPM

## 2021-12-15 DIAGNOSIS — E1165 Type 2 diabetes mellitus with hyperglycemia: Secondary | ICD-10-CM | POA: Diagnosis not present

## 2022-01-12 DIAGNOSIS — E1165 Type 2 diabetes mellitus with hyperglycemia: Secondary | ICD-10-CM | POA: Diagnosis not present

## 2022-02-12 DIAGNOSIS — E1165 Type 2 diabetes mellitus with hyperglycemia: Secondary | ICD-10-CM | POA: Diagnosis not present

## 2022-02-14 DIAGNOSIS — H401132 Primary open-angle glaucoma, bilateral, moderate stage: Secondary | ICD-10-CM | POA: Diagnosis not present

## 2022-02-24 DIAGNOSIS — M5416 Radiculopathy, lumbar region: Secondary | ICD-10-CM | POA: Diagnosis not present

## 2022-02-28 DIAGNOSIS — L818 Other specified disorders of pigmentation: Secondary | ICD-10-CM | POA: Diagnosis not present

## 2022-02-28 DIAGNOSIS — L80 Vitiligo: Secondary | ICD-10-CM | POA: Diagnosis not present

## 2022-03-02 ENCOUNTER — Ambulatory Visit: Payer: Medicare Other | Admitting: Podiatry

## 2022-03-04 ENCOUNTER — Ambulatory Visit: Payer: Medicare Other | Admitting: Podiatry

## 2022-03-07 DIAGNOSIS — Z794 Long term (current) use of insulin: Secondary | ICD-10-CM | POA: Diagnosis not present

## 2022-03-07 DIAGNOSIS — R5383 Other fatigue: Secondary | ICD-10-CM | POA: Diagnosis not present

## 2022-03-07 DIAGNOSIS — I10 Essential (primary) hypertension: Secondary | ICD-10-CM | POA: Diagnosis not present

## 2022-03-07 DIAGNOSIS — E782 Mixed hyperlipidemia: Secondary | ICD-10-CM | POA: Diagnosis not present

## 2022-03-07 DIAGNOSIS — I700 Atherosclerosis of aorta: Secondary | ICD-10-CM | POA: Diagnosis not present

## 2022-03-07 DIAGNOSIS — E113293 Type 2 diabetes mellitus with mild nonproliferative diabetic retinopathy without macular edema, bilateral: Secondary | ICD-10-CM | POA: Diagnosis not present

## 2022-03-09 DIAGNOSIS — Z5181 Encounter for therapeutic drug level monitoring: Secondary | ICD-10-CM | POA: Diagnosis not present

## 2022-03-09 DIAGNOSIS — Z0000 Encounter for general adult medical examination without abnormal findings: Secondary | ICD-10-CM | POA: Diagnosis not present

## 2022-03-09 DIAGNOSIS — I700 Atherosclerosis of aorta: Secondary | ICD-10-CM | POA: Diagnosis not present

## 2022-03-09 DIAGNOSIS — Z794 Long term (current) use of insulin: Secondary | ICD-10-CM | POA: Diagnosis not present

## 2022-03-09 DIAGNOSIS — I10 Essential (primary) hypertension: Secondary | ICD-10-CM | POA: Diagnosis not present

## 2022-03-09 DIAGNOSIS — M150 Primary generalized (osteo)arthritis: Secondary | ICD-10-CM | POA: Diagnosis not present

## 2022-03-09 DIAGNOSIS — E113293 Type 2 diabetes mellitus with mild nonproliferative diabetic retinopathy without macular edema, bilateral: Secondary | ICD-10-CM | POA: Diagnosis not present

## 2022-03-09 DIAGNOSIS — E782 Mixed hyperlipidemia: Secondary | ICD-10-CM | POA: Diagnosis not present

## 2022-03-16 ENCOUNTER — Ambulatory Visit (INDEPENDENT_AMBULATORY_CARE_PROVIDER_SITE_OTHER): Payer: Medicare Other | Admitting: Podiatry

## 2022-03-16 DIAGNOSIS — E1142 Type 2 diabetes mellitus with diabetic polyneuropathy: Secondary | ICD-10-CM | POA: Diagnosis not present

## 2022-03-16 DIAGNOSIS — L84 Corns and callosities: Secondary | ICD-10-CM | POA: Diagnosis not present

## 2022-03-22 DIAGNOSIS — M47816 Spondylosis without myelopathy or radiculopathy, lumbar region: Secondary | ICD-10-CM | POA: Diagnosis not present

## 2022-03-27 ENCOUNTER — Encounter: Payer: Self-pay | Admitting: Podiatry

## 2022-03-27 NOTE — Progress Notes (Signed)
?Subjective:  ?Patient ID: Bonnie [name], female    DOB: Jul 08, 1956,  MRN: [id number] ? ?Bonnie [name] presents to clinic today for at risk foot care with history of diabetic neuropathy and callus(es) b/l lower extremities and painful thick toenails that are difficult to trim. Painful toenails interfere with ambulation. Aggravating factors include wearing enclosed shoe gear. Pain is relieved with periodic professional debridement. Painful calluses are aggravated when weightbearing with and without shoegear. Pain is relieved with periodic professional debridement. ? ?Patient states blood glucose was 202 mg/dl today.   ? ?Last known HgA1c was 7.0%. ? ?She continues to have neuropathic pain bilaterally. ? ?PCP is [name], MD , and last visit was March 09, 2022. ? ?Allergies  ?Allergen Reactions  ? Clarithromycin Shortness Of Breath, Swelling, Anxiety and Other (See Comments)  ?  Throat swelling ?Other Reaction: laryngeal edema ?Other reaction(s): breathing issues, Other (See Comments) ?Throat swelling ?Other Reaction: laryngeal edema ?Other reaction(s): Other (See Comments) ?Throat swelling ?Other Reaction: laryngeal edema ?Throat swelling ?Throat swelling ?Other Reaction: laryngeal edema ?  ? Exenatide Anaphylaxis, Rash and Other (See Comments)  ?   Caused Sweating, sores in the mouth  ?Also gum soreness/irritation ? ?Other reaction(s): Other (See Comments) ?Also gum soreness/irritation ?Caused Sweating, sores in the mouth  ?Caused Sweating, sores in the mouth  ?Also gum soreness/irritation ? Caused Sweating, sores in the mouth  ?Also gum soreness/irritation ?Caused Sweating, sores in the mouth   ? Ibuprofen Rash and Other (See Comments)  ?  Nose bleed  ? Methocarbamol Anaphylaxis and Swelling  ?  Swollen eyes  ? Shellfish Allergy Nausea And Vomiting  ? Ampicillin Nausea And Vomiting  ? Carafate [Sucralfate] Nausea And Vomiting  ? Cephalexin   ?  Other reaction(s): breathing issues ?Other reaction(s):  breathing issues  ? Cyanocobalamin   ? Glyburide   ?  Other reaction(s): ?? ?Other reaction(s): ??  ? Metronidazole Nausea And Vomiting, Nausea Only and Swelling  ?  Pt stated, "Eyes swelled up"  ? Prednisone   ?  Other reaction(s): BS fluctuations ?Other reaction(s): BS fluctuations  ? Pregabalin   ?  Other reaction(s): disoriented ?Other reaction(s): disoriented  ? Vitamin B12 Swelling  ? Ketoconazole Nausea And Vomiting and Palpitations  ?  Other reaction(s): Irregular Heart Rate ? ?Other reaction(s): Other (See Comments) ?Other reaction(s): Irregular Heart Rate ?Other reaction(s): Irregular Heart Rate  ? Mango Flavor Itching and Rash  ? Pineapple Rash  ? Strawberry Flavor Itching  ? ? ?Review of Systems: Negative except as noted in the HPI. ? ?Objective: No changes noted in today's physical examination. ?Bonnie [name] is a pleasant 66 y.o. female in NAD. AAO X 3. ? ?Vascular Examination: ?CFT immediate b/l LE. Palpable DP/PT pulses b/l LE. Digital hair sparse b/l. Skin temperature gradient WNL b/l. No pain with calf compression b/l. No edema noted b/l. No cyanosis or clubbing noted b/l LE. ? ?Dermatological Examination: ?Pedal integument with normal turgor, texture and tone BLE. No open wounds b/l LE. No interdigital macerations noted b/l LE. Toenails 1-5 b/l well maintained with adequate length. No erythema, no edema, no drainage, no fluctuance. Hyperkeratotic lesion(s) submet head 1 b/l and submet head 5 b/l.  No erythema, no edema, no drainage, no fluctuance. ? ?Musculoskeletal Examination: ?Normal muscle strength 5/5 to all lower extremity muscle groups bilaterally. No pain, crepitus or joint limitation noted with ROM b/l LE. No gross bony pedal deformities b/l. Patient ambulates independently without assistive aids. ? ?Neurological Examination: ?Pt has  subjective symptoms of neuropathy. Protective sensation intact 5/5 intact bilaterally with 10g monofilament b/l. ? ?Assessment/Plan: ?1. Callus   ?2. Diabetic  peripheral neuropathy associated with type 2 diabetes mellitus ([place])   ?  ?-Patient was evaluated and treated. All patient's and/or POA's questions/concerns answered on today's visit. ?-We discussed neuropathy. She is allergic to pregabalin. She continues to use Vitamin B-12 and Zinc. ?-Patient to continue soft, supportive shoe gear daily. ?-Callus(es) submet head 1 b/l and submet head 5 b/l pared utilizing sterile scalpel blade without complication or incident. Total number debrided =4. ?-Patient/POA to call should there be question/concern in the interim.  ? ?Return in about 3 months (around 06/16/2022). ? ?Marzetta Board, DPM  ?

## 2022-04-12 ENCOUNTER — Ambulatory Visit: Payer: Medicare Other | Admitting: Podiatry

## 2022-04-14 DIAGNOSIS — M47816 Spondylosis without myelopathy or radiculopathy, lumbar region: Secondary | ICD-10-CM | POA: Diagnosis not present

## 2022-05-02 DIAGNOSIS — M47816 Spondylosis without myelopathy or radiculopathy, lumbar region: Secondary | ICD-10-CM | POA: Diagnosis not present

## 2022-05-16 DIAGNOSIS — L80 Vitiligo: Secondary | ICD-10-CM | POA: Diagnosis not present

## 2022-05-16 DIAGNOSIS — L249 Irritant contact dermatitis, unspecified cause: Secondary | ICD-10-CM | POA: Diagnosis not present

## 2022-05-16 DIAGNOSIS — B351 Tinea unguium: Secondary | ICD-10-CM | POA: Diagnosis not present

## 2022-05-16 DIAGNOSIS — L821 Other seborrheic keratosis: Secondary | ICD-10-CM | POA: Diagnosis not present

## 2022-05-26 DIAGNOSIS — M47816 Spondylosis without myelopathy or radiculopathy, lumbar region: Secondary | ICD-10-CM | POA: Diagnosis not present

## 2022-06-05 ENCOUNTER — Ambulatory Visit (INDEPENDENT_AMBULATORY_CARE_PROVIDER_SITE_OTHER): Payer: Medicare Other

## 2022-06-05 ENCOUNTER — Encounter (HOSPITAL_COMMUNITY): Payer: Self-pay

## 2022-06-05 ENCOUNTER — Ambulatory Visit (HOSPITAL_COMMUNITY)
Admission: EM | Admit: 2022-06-05 | Discharge: 2022-06-05 | Disposition: A | Discharge status: Home / Self Care | Payer: Medicare Other | Attending: Internal Medicine | Admitting: Internal Medicine

## 2022-06-05 DIAGNOSIS — M79674 Pain in right toe(s): Secondary | ICD-10-CM | POA: Diagnosis not present

## 2022-06-05 DIAGNOSIS — W19XXXA Unspecified fall, initial encounter: Secondary | ICD-10-CM

## 2022-06-05 DIAGNOSIS — M79671 Pain in right foot: Secondary | ICD-10-CM | POA: Diagnosis not present

## 2022-06-05 NOTE — Discharge Instructions (Addendum)
Your x-ray was negative for fracture and acute bony abnormality to the right foot.  Continue wearing supportive shoes and follow-up with your podiatrist as scheduled in August.  You may continue to take 500 mg of Tylenol every 6 hours as needed for pain.  Elevate your right foot and rest over the next couple of days to allow your pinky toe sprain to heal.  If you develop any new or worsening symptoms or do not improve in the next 2 to 3 days, please return.  If your symptoms are severe, please go to the emergency room.  Follow-up with your primary care provider for further evaluation and management of your symptoms as well as ongoing wellness visits.  I hope you feel better!

## 2022-06-05 NOTE — ED Triage Notes (Signed)
Tripped and fell yesterday. C/O pain to the right foot. Positive right pedal pulse and cap refill less than 2 sec.

## 2022-06-05 NOTE — ED Provider Notes (Signed)
[place]    CSN: [id number] Arrival date & time: 06/05/22  1004      History   Chief Complaint Chief Complaint  Patient presents with   Fall    HPI Bonnie [name] is a 66 y.o. female.   Patient presents to urgent care for evaluation after she tripped on her right foot and fell while she was walking across the street yesterday at around 5 or 6 PM.  Patient states that after the fall, she experienced great pain to her right pinky toe.  Patient was wearing tennis shoes and socks during the fall.  She did not hit her head and she does not take blood thinners.  Denies loss of consciousness.  She was able to catch herself and remain upright for the entire event.  Patient has chronic neuropathy as well as calluses to the right foot for which she sees neurology and podiatry.  Pain to the right pinky toe is currently a 4 on a scale of 0-10.  Patient took 500 mg of Tylenol and half of a turmeric pill this morning for her pain with some relief.  She states that she cannot take ibuprofen because of her vitiligo and other skin disorder.  Walking makes her pain worse to her right pinky toe.  She is able to ambulate, but has a limp when doing so.  Denies bilateral ankle pain, bilateral knee pain, dizziness, fatigue, weakness, fever/chills, nausea, urinary symptoms, and feeling lightheaded or shaky.  No other aggravating or relieving factors identified for patient's pain.   Fall    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   Diabetic renal disease ([place]) 12/11/2021   Adult general medical exam 12/11/2021   Hardening of the aorta (main  artery of the heart) ([place]) 12/11/2021   Hordeolum externum of right upper eyelid 08/20/2020   Glaucoma 06/23/2020   History of spinal fusion 11/27/2019   Chalazion of left upper eyelid 06/27/2019   Primary open angle glaucoma of both eyes, mild stage 06/27/2019   Hypertensive disorder 04/23/2019   Cervical radiculopathy 04/24/2018   Cystoid macular edema following cataract surgery, right eye 05/04/2017   Cervical pain 03/07/2016   Sickle cell trait ([place]) 03/07/2016   Carpal tunnel syndrome 12/07/2015   Pain in female genitalia on intercourse 09/17/2014   Nausea with vomiting 04/22/2014   Diarrhea 04/22/2014   Essential hypertension, benign 04/20/2014   Ileitis 04/19/2014   Cataract 12/25/2013   Dry eye syndrome 12/25/2013   Chronic LBP 02/27/2013   Hernia, rectovaginal 02/12/2013   Dysfunctional voiding of urine 02/12/2013   Bladder cystocele 12/06/2012   Diabetes mellitus ([place]) 12/06/2012   HLD (hyperlipidemia) 12/06/2012   Benign neoplasm of meninges ([place]) 12/06/2012   Mixed incontinence 12/06/2012   Open angle with borderline findings, high risk 10/17/2011   Chest pain, atypical 10/01/2011   Shortness of breath 10/01/2011   DM type 2 (diabetes mellitus, type 2) ([place]) 10/01/2011   Type 2 diabetes mellitus with both eyes affected by mild nonproliferative retinopathy without macular edema,  with [place]-term current use of insulin ([place]) 10/01/2011   Hypercholesteremia    Anemia    Obesity    Anxiety    GERD (gastroesophageal reflux disease)     Past Surgical History:  Procedure Laterality Date   ABDOMINAL HYSTERECTOMY     BACK SURGERY     2001 for DJD   BLADDER SURGERY     BRAIN SURGERY     BRAIN SURGERY     For meningioma.   COCCYX REMOVAL     COLONOSCOPY Left 04/22/2014   Procedure: COLONOSCOPY;  Surgeon: [name], MD;  Location: [facility];  Service: Endoscopy;  Laterality: Left;   ESOPHAGOGASTRODUODENOSCOPY (EGD) WITH PROPOFOL N/A 02/12/2021   Procedure:  ESOPHAGOGASTRODUODENOSCOPY (EGD) WITH PROPOFOL;  Surgeon: Carol Ada, MD;  Location: [facility];  Service: Endoscopy;  Laterality: N/A;   SAVORY DILATION N/A 02/12/2021   Procedure: SAVORY DILATION;  Surgeon: Carol Ada, MD;  Location: [facility];  Service: Endoscopy;  Laterality: N/A;   SHOULDER SURGERY     TONSILLECTOMY      OB History   No obstetric history on file.      Home Medications    Prior to Admission medications   Medication Sig Start Date End Date Taking? Authorizing Provider  acetaminophen (TYLENOL) 500 MG tablet Take 250-500 mg by mouth See admin instructions. Take 250 mg by mouth in the morning, 500 mg in the afternoon at 1:00 pm and 250 mg with dinner   Yes [provider]  ascorbic acid (VITAMIN C) 1000 MG tablet Take 1 tablet by mouth daily.   Yes [provider]  Blood Glucose Monitoring Suppl (FIFTY50 GLUCOSE METER 2.0) w/Device KIT OneTouch UltraMini kit  USE AS DIRECTED TO CHECK BLOOD SUGARS TWICE A DAY   Yes [provider]  clonazePAM (KLONOPIN) 1 MG tablet Take 1 mg by mouth at bedtime. 01/05/22  Yes [provider]  Continuous Blood Gluc Receiver ([place]) [place] See admin instructions. 01/17/22  Yes [provider]  Continuous Blood Gluc Receiver ([place]) [place] See admin instructions. 01/14/22  Yes [provider]  dapagliflozin propanediol (FARXIGA) 5 MG TABS tablet Take 5 mg by mouth in the morning.   Yes [provider]  dexlansoprazole (DEXILANT) 60 MG capsule Take 60 mg by mouth in the morning.   Yes [provider]  dorzolamide-timolol (COSOPT) 22.3-6.8 MG/ML ophthalmic solution Place 1 drop into both eyes 2 (two) times daily. 12/09/21  Yes [provider]  Insulin Pen Needle 31G X 5 MM MISC USE TWICE A DAY 03/28/18  Yes [provider]  Lancets (ONETOUCH ULTRASOFT) lancets OneTouch UltraSoft Lancets   Yes [provider]  liraglutide  (VICTOZA) 18 MG/3ML SOPN Inject 1.8 mg into the skin daily.   Yes [provider]  metFORMIN (GLUCOPHAGE) 1000 MG tablet Take 1,000 mg by mouth 2 (two) times daily with a meal.   Yes [provider]  ONETOUCH VERIO test strip 2 (two) times daily. use for testing 04/03/19  Yes [provider]  ramipril (ALTACE) 5 MG capsule Take 5 mg by mouth in the morning.   Yes [provider]  simvastatin (ZOCOR) 40 MG tablet 20 mg nightly. 1/2 tablet daily   Yes [provider]  valACYclovir (VALTREX) 1000 MG tablet Take 500 mg by mouth in the morning.   Yes [provider]  Vitamin D, Cholecalciferol, 25 MCG (1000 UT) TABS Take 1,000 Units by mouth daily.   Yes [provider]  acetaminophen (TYLENOL) 325 MG tablet Take by mouth.    [provider]  amitriptyline (ELAVIL) 10 MG tablet amitriptyline 10 mg tablet  1 TABLET AT BEDTIME,FOR NEUROPATHY ORALLY ONCE A DAY 30 DAY(S)    [provider]  azithromycin (ZITHROMAX) 250 MG tablet TAKE 2 TABLETS BY MOUTH TODAY, THEN TAKE 1 TABLET DAILY FOR 4 DAYS 08/03/21   [provider]  carboxymethylcellulose (REFRESH PLUS) 0.5 % SOLN Place 1 drop into both eyes 3 (three) times daily as needed.    [provider]  Cholecalciferol 125 MCG (5000 UT) TABS Take by mouth.    [provider]  clotrimazole-betamethasone (LOTRISONE) cream Apply 1 application topically daily as needed (irritation).    [provider]  ferrous sulfate 325 (65 FE) MG tablet 1 tablet    [provider]  fluticasone (FLONASE) 50 MCG/ACT nasal spray Place 1 spray into both nostrils daily.    [provider]  HYDROcodone-acetaminophen (NORCO) 5-325 MG per tablet Take 1 tablet by mouth at bedtime.    [provider]  insulin glargine (LANTUS) 100 UNIT/ML injection Inject 11 Units into the skin at bedtime.    [provider]  timolol (TIMOPTIC) 0.5 %  ophthalmic solution Place 1 drop into both eyes 2 (two) times daily. 09/04/18   [provider]  TURMERIC PO Take 1 capsule by mouth in the morning.    [provider]  vitamin C (ASCORBIC ACID) 500 MG tablet Take 500 mg by mouth daily.    [provider]    Family History Family History  Problem Relation Age of Onset   Diabetes Mother    Lung cancer Father    Diabetes Brother     Social History Social History   Tobacco Use   Smoking status: Former    Packs/day: 0.50    Years: 26.00    Total pack years: 13.00    Types: Cigarettes    Quit date: 1998    Years since quitting: 25.5   Smokeless tobacco: Never  Vaping Use   Vaping Use: Never used  Substance Use Topics   Alcohol use: Yes    Comment: occasionally   Drug use: No     Allergies   Clarithromycin, Exenatide, Ibuprofen, Methocarbamol, Shellfish allergy, Ampicillin, Carafate [sucralfate], Cephalexin, Cyanocobalamin, Glyburide, Metronidazole, Prednisone, Pregabalin, Vitamin b12, Ketoconazole, Mango flavor, Pineapple, and Strawberry flavor   Review of Systems Review of Systems Per HPI  Physical Exam Triage Vital Signs ED Triage Vitals [06/05/22 1022]  Enc Vitals Group     BP 130/80     Pulse Rate 77     Resp 18     Temp 98.2 F (36.8 C)     Temp Source Oral     SpO2 95 %     Weight      Height      Head Circumference      Peak Flow      Pain Score 4     Pain Loc      Pain Edu?      Excl. in [place]?    No data found.  Updated Vital Signs BP 130/80 (BP Location: Left Arm)   Pulse 77   Temp 98.2 F (36.8 C) (Oral)   Resp 18   SpO2 95%   Visual Acuity Right Eye Distance:   Left Eye Distance:   Bilateral Distance:    Right Eye Near:   Left Eye Near:    Bilateral  Near:     Physical Exam Vitals and nursing note reviewed.  Constitutional:      Appearance: Normal appearance. She is not ill-appearing or toxic-appearing.     Comments: Very pleasant patient sitting on  exam in position of comfort table in no acute distress.   HENT:     Head: Normocephalic and atraumatic.     Right Ear: Hearing and external ear normal.     Left Ear: Hearing and external ear normal.     Nose: Nose normal.     Mouth/Throat:     Lips: Pink.     Mouth: Mucous membranes are moist.  Eyes:     General: Lids are normal. Vision grossly intact. Gaze aligned appropriately.     Extraocular Movements: Extraocular movements intact.     Conjunctiva/sclera: Conjunctivae normal.  Pulmonary:     Effort: Pulmonary effort is normal.  Abdominal:     Palpations: Abdomen is soft.  Musculoskeletal:     Cervical back: Neck supple.     Comments: Right foot: Chronic skin and nail changes present.  Right pinky toe is tender to palpation to the lateral aspect of the toe.  Patient is also tender to palpation to the dorsal aspect of the right foot at the bases of the fourth and fifth digits.  No tenderness to palpation to the sole aspect of the foot.  5/5 strength with dorsiflexion and plantarflexion present.  There is no ecchymosis, evidence of injury or trauma, or deformity to the right foot and area of greatest tenderness.  Right ankle has full range of motion and is not tender to palpation.  Sensation intact bilaterally.  +2 dorsalis pedis pulses bilaterally present.  Capillary refill to the right foot is less than 3.  Skin:    General: Skin is warm and dry.     Capillary Refill: Capillary refill takes less than 2 seconds.     Findings: No rash.  Neurological:     General: No focal deficit present.     Mental Status: She is alert and oriented to person, place, and time. Mental status is at baseline.     Cranial Nerves: No dysarthria or facial asymmetry.     Gait: Gait is intact.  Psychiatric:        Mood and Affect: Mood normal.        Speech: Speech normal.        Behavior: Behavior normal.        Thought Content: Thought content normal.        Judgment: Judgment normal.      UC  Treatments / Results  Labs (all labs ordered are listed, but only abnormal results are displayed) Labs Reviewed - No data to display  EKG   Radiology DG Foot Complete Right  Result Date: 06/05/2022 CLINICAL DATA:  Fall yesterday with right foot pain. EXAM: RIGHT FOOT COMPLETE - 3+ VIEW COMPARISON:  10/07/2019 FINDINGS: There is no evidence of fracture or dislocation. Generalized osteopenic appearance. IMPRESSION: No acute finding. Electronically Signed   By: Jorje Guild M.D.   On: 06/05/2022 11:24    Procedures Procedures (including critical care time)  Medications Ordered in UC Medications - No data to display  Initial Impression / Assessment and Plan / UC Course  I have reviewed the triage vital signs and the nursing notes.  Pertinent labs & imaging results that were available during my care of the patient were reviewed by me and considered in my medical decision making (see  chart for details).  1.  Fall X-rays negative for fracture and acute bony abnormality of the right foot.  Patient likely sprained ligament to the right pinky toe.  She is to continue to wear supportive shoes and follow-up with podiatrist as scheduled in August.  She is to continue taking 500 mg of Tylenol every 6 hours as needed for pain.  Rest, elevation, ice, and compression recommended.  Stable neurovascular exam present today.  Patient is agreeable with this plan.   Discussed physical exam and available lab work findings in clinic with patient.  Counseled patient regarding appropriate use of medications and potential side effects for all medications recommended or prescribed today. Discussed red flag signs and symptoms of worsening condition,when to call the PCP office, return to urgent care, and when to seek higher level of care in the emergency department. Patient verbalizes understanding and agreement with plan. All questions answered. Patient discharged in stable condition.  Final Clinical  Impressions(s) / UC Diagnoses   Final diagnoses:  Fall, initial encounter  Pain of toe of right foot     Discharge Instructions      Your x-ray was negative for fracture and acute bony abnormality to the right foot.  Continue wearing supportive shoes and follow-up with your podiatrist as scheduled in August.  You may continue to take 500 mg of Tylenol every 6 hours as needed for pain.  Elevate your right foot and rest over the next couple of days to allow your pinky toe sprain to heal.  If you develop any new or worsening symptoms or do not improve in the next 2 to 3 days, please return.  If your symptoms are severe, please go to the emergency room.  Follow-up with your primary care provider for further evaluation and management of your symptoms as well as ongoing wellness visits.  I hope you feel better!     ED Prescriptions   None    PDMP not reviewed this encounter.   Talbot Grumbling, [place] 06/05/22 1158

## 2022-06-16 DIAGNOSIS — H401131 Primary open-angle glaucoma, bilateral, mild stage: Secondary | ICD-10-CM | POA: Diagnosis not present

## 2022-06-16 DIAGNOSIS — Z794 Long term (current) use of insulin: Secondary | ICD-10-CM | POA: Diagnosis not present

## 2022-06-16 DIAGNOSIS — E113291 Type 2 diabetes mellitus with mild nonproliferative diabetic retinopathy without macular edema, right eye: Secondary | ICD-10-CM | POA: Diagnosis not present

## 2022-06-16 DIAGNOSIS — Z961 Presence of intraocular lens: Secondary | ICD-10-CM | POA: Diagnosis not present

## 2022-06-16 DIAGNOSIS — Z7984 Long term (current) use of oral hypoglycemic drugs: Secondary | ICD-10-CM | POA: Diagnosis not present

## 2022-06-16 DIAGNOSIS — E113212 Type 2 diabetes mellitus with mild nonproliferative diabetic retinopathy with macular edema, left eye: Secondary | ICD-10-CM | POA: Diagnosis not present

## 2022-06-21 ENCOUNTER — Encounter: Payer: Self-pay | Admitting: Podiatry

## 2022-06-21 ENCOUNTER — Ambulatory Visit: Payer: Medicare Other | Admitting: Podiatry

## 2022-06-21 DIAGNOSIS — M79675 Pain in left toe(s): Secondary | ICD-10-CM | POA: Diagnosis not present

## 2022-06-21 DIAGNOSIS — L84 Corns and callosities: Secondary | ICD-10-CM

## 2022-06-21 DIAGNOSIS — B351 Tinea unguium: Secondary | ICD-10-CM

## 2022-06-21 DIAGNOSIS — E1142 Type 2 diabetes mellitus with diabetic polyneuropathy: Secondary | ICD-10-CM

## 2022-06-21 DIAGNOSIS — M79674 Pain in right toe(s): Secondary | ICD-10-CM | POA: Diagnosis not present

## 2022-06-27 NOTE — Progress Notes (Signed)
Subjective:  Patient ID: Bonnie [name], female    DOB: 17-Apr-1956,  MRN: [id number]  Bonnie [name] presents to clinic today for at risk foot care with history of diabetic neuropathy and callus(es) b/l lower extremities and painful thick toenails that are difficult to trim. Painful toenails interfere with ambulation. Aggravating factors include wearing enclosed shoe gear. Pain is relieved with periodic professional debridement. Painful calluses are aggravated when weightbearing with and without shoegear. Pain is relieved with periodic professional debridement.  Patient states blood glucose was 122 mg/dl today.  Last A1c was 7%.  New problem(s): None.   She is accompanied by her husband on today's visit.  PCP is [name], MD , and last visit was  June 17, 2022  Allergies  Allergen Reactions   Clarithromycin Shortness Of Breath, Swelling, Anxiety and Other (See Comments)    Throat swelling Other Reaction: laryngeal edema Other reaction(s): breathing issues, Other (See Comments) Throat swelling Other Reaction: laryngeal edema Other reaction(s): Other (See Comments) Throat swelling Other Reaction: laryngeal edema Throat swelling Throat swelling Other Reaction: laryngeal edema    Exenatide Anaphylaxis, Rash and Other (See Comments)     Caused Sweating, sores in the mouth  Also gum soreness/irritation  Other reaction(s): Other (See Comments) Also gum soreness/irritation Caused Sweating, sores in the mouth  Caused Sweating, sores in the mouth  Also gum soreness/irritation  Caused Sweating, sores in the mouth  Also gum soreness/irritation Caused Sweating, sores in the mouth    Ibuprofen Rash and Other (See Comments)    Nose bleed   Methocarbamol Anaphylaxis and Swelling    Swollen eyes   Shellfish Allergy Nausea And Vomiting   Ampicillin Nausea And Vomiting   Carafate [Sucralfate] Nausea And Vomiting   Cephalexin     Other reaction(s): breathing issues Other  reaction(s): breathing issues   Cyanocobalamin    Glyburide     Other reaction(s): ?? Other reaction(s): ??   Metronidazole Nausea And Vomiting, Nausea Only and Swelling    Pt stated, "Eyes swelled up"   Prednisone     Other reaction(s): BS fluctuations Other reaction(s): BS fluctuations   Pregabalin     Other reaction(s): disoriented Other reaction(s): disoriented   Vitamin B12 Swelling   Ketoconazole Nausea And Vomiting and Palpitations    Other reaction(s): Irregular Heart Rate  Other reaction(s): Other (See Comments) Other reaction(s): Irregular Heart Rate Other reaction(s): Irregular Heart Rate   Mango Flavor Itching and Rash   Pineapple Rash   Strawberry Flavor Itching    Review of Systems: Negative except as noted in the HPI.  Objective: No changes noted in today's physical examination. Bonnie [name] is a pleasant 66 y.o. female in NAD. AAO X 3.  Vascular Examination: CFT immediate b/l LE. Palpable DP/PT pulses b/l LE. Digital hair sparse b/l. Skin temperature gradient WNL b/l. No pain with calf compression b/l. No edema noted b/l. No cyanosis or clubbing noted b/l LE.  Dermatological Examination: Pedal integument with normal turgor, texture and tone BLE. No open wounds b/l LE. No interdigital macerations noted b/l LE. Toenails 2-5 b/l well maintained with adequate length. No erythema, no edema, no drainage, no fluctuance. Anonychia noted bilateral great toes. Nailbed(s) epithelialized.   Hyperkeratotic lesion(s) submet head 1 b/l and submet head 5 b/l.  No erythema, no edema, no drainage, no fluctuance.  Musculoskeletal Examination: Normal muscle strength 5/5 to all lower extremity muscle groups bilaterally. No pain, crepitus or joint limitation noted with ROM b/l LE. No gross bony  pedal deformities b/l. Patient ambulates independently without assistive aids.  Neurological Examination: Pt has subjective symptoms of neuropathy. Protective sensation intact 5/5 intact  bilaterally with 10g monofilament b/l.  Assessment/Plan: 1. Pain due to onychomycosis of toenails of both feet   2. Callus   3. Diabetic peripheral neuropathy associated with type 2 diabetes mellitus ([place])     -Patient's family member present. All questions/concerns addressed on today's visit. -Examined patient. -No new findings. No new orders. -Continue foot and shoe inspections daily. Monitor blood glucose per PCP/Endocrinologist's recommendations. -Toenails 2-5 bilaterally debrided in length and girth without iatrogenic bleeding with sterile nail nipper and dremel.  -Callus(es) submet head 1 b/l and submet head 5 b/l pared utilizing sterile scalpel blade without complication or incident. Total number debrided =4. -Patient/POA to call should there be question/concern in the interim.   Return in about 3 months (around 09/21/2022).  Bonnie [name], DPM

## 2022-07-06 DIAGNOSIS — H401131 Primary open-angle glaucoma, bilateral, mild stage: Secondary | ICD-10-CM | POA: Diagnosis not present

## 2022-07-25 DIAGNOSIS — R051 Acute cough: Secondary | ICD-10-CM | POA: Diagnosis not present

## 2022-07-25 DIAGNOSIS — J00 Acute nasopharyngitis [common cold]: Secondary | ICD-10-CM | POA: Diagnosis not present

## 2022-07-25 DIAGNOSIS — I10 Essential (primary) hypertension: Secondary | ICD-10-CM | POA: Diagnosis not present

## 2022-07-25 DIAGNOSIS — Z03818 Encounter for observation for suspected exposure to other biological agents ruled out: Secondary | ICD-10-CM | POA: Diagnosis not present

## 2022-07-25 DIAGNOSIS — E119 Type 2 diabetes mellitus without complications: Secondary | ICD-10-CM | POA: Diagnosis not present

## 2022-08-01 DIAGNOSIS — R1031 Right lower quadrant pain: Secondary | ICD-10-CM | POA: Diagnosis not present

## 2022-08-01 DIAGNOSIS — R110 Nausea: Secondary | ICD-10-CM | POA: Diagnosis not present

## 2022-08-02 DIAGNOSIS — H401131 Primary open-angle glaucoma, bilateral, mild stage: Secondary | ICD-10-CM | POA: Diagnosis not present

## 2022-08-02 DIAGNOSIS — R1031 Right lower quadrant pain: Secondary | ICD-10-CM | POA: Diagnosis not present

## 2022-08-10 ENCOUNTER — Other Ambulatory Visit: Payer: Self-pay | Admitting: Internal Medicine

## 2022-08-10 DIAGNOSIS — R1031 Right lower quadrant pain: Secondary | ICD-10-CM

## 2022-08-22 DIAGNOSIS — E113293 Type 2 diabetes mellitus with mild nonproliferative diabetic retinopathy without macular edema, bilateral: Secondary | ICD-10-CM | POA: Diagnosis not present

## 2022-08-22 DIAGNOSIS — Z961 Presence of intraocular lens: Secondary | ICD-10-CM | POA: Diagnosis not present

## 2022-08-22 DIAGNOSIS — H43813 Vitreous degeneration, bilateral: Secondary | ICD-10-CM | POA: Diagnosis not present

## 2022-08-22 DIAGNOSIS — H04129 Dry eye syndrome of unspecified lacrimal gland: Secondary | ICD-10-CM | POA: Diagnosis not present

## 2022-08-22 DIAGNOSIS — H0288A Meibomian gland dysfunction right eye, upper and lower eyelids: Secondary | ICD-10-CM | POA: Diagnosis not present

## 2022-08-22 DIAGNOSIS — H401130 Primary open-angle glaucoma, bilateral, stage unspecified: Secondary | ICD-10-CM | POA: Diagnosis not present

## 2022-08-22 DIAGNOSIS — Z881 Allergy status to other antibiotic agents status: Secondary | ICD-10-CM | POA: Diagnosis not present

## 2022-08-22 DIAGNOSIS — Z880 Allergy status to penicillin: Secondary | ICD-10-CM | POA: Diagnosis not present

## 2022-08-22 DIAGNOSIS — H539 Unspecified visual disturbance: Secondary | ICD-10-CM | POA: Diagnosis not present

## 2022-08-22 DIAGNOSIS — H35043 Retinal micro-aneurysms, unspecified, bilateral: Secondary | ICD-10-CM | POA: Diagnosis not present

## 2022-08-22 DIAGNOSIS — Z7984 Long term (current) use of oral hypoglycemic drugs: Secondary | ICD-10-CM | POA: Diagnosis not present

## 2022-08-22 DIAGNOSIS — Z794 Long term (current) use of insulin: Secondary | ICD-10-CM | POA: Diagnosis not present

## 2022-08-22 DIAGNOSIS — H0288B Meibomian gland dysfunction left eye, upper and lower eyelids: Secondary | ICD-10-CM | POA: Diagnosis not present

## 2022-08-22 DIAGNOSIS — Z886 Allergy status to analgesic agent status: Secondary | ICD-10-CM | POA: Diagnosis not present

## 2022-08-22 DIAGNOSIS — H47292 Other optic atrophy, left eye: Secondary | ICD-10-CM | POA: Diagnosis not present

## 2022-08-22 DIAGNOSIS — Z888 Allergy status to other drugs, medicaments and biological substances status: Secondary | ICD-10-CM | POA: Diagnosis not present

## 2022-08-30 ENCOUNTER — Ambulatory Visit
Admission: RE | Admit: 2022-08-30 | Discharge: 2022-08-30 | Disposition: A | Discharge status: Home / Self Care | Payer: Medicare Other | Source: Ambulatory Visit | Attending: Internal Medicine | Admitting: Internal Medicine

## 2022-08-30 DIAGNOSIS — D179 Benign lipomatous neoplasm, unspecified: Secondary | ICD-10-CM | POA: Diagnosis not present

## 2022-08-30 DIAGNOSIS — R1031 Right lower quadrant pain: Secondary | ICD-10-CM

## 2022-08-30 DIAGNOSIS — I700 Atherosclerosis of aorta: Secondary | ICD-10-CM | POA: Diagnosis not present

## 2022-08-30 MED ORDER — IOPAMIDOL (ISOVUE-300) INJECTION 61%
100.0000 mL | Freq: Once | INTRAVENOUS | Status: AC | PRN
  Administered 2022-08-30: 100 mL via INTRAVENOUS

## 2022-09-07 DIAGNOSIS — Z5181 Encounter for therapeutic drug level monitoring: Secondary | ICD-10-CM | POA: Diagnosis not present

## 2022-09-07 DIAGNOSIS — E782 Mixed hyperlipidemia: Secondary | ICD-10-CM | POA: Diagnosis not present

## 2022-09-07 DIAGNOSIS — E113293 Type 2 diabetes mellitus with mild nonproliferative diabetic retinopathy without macular edema, bilateral: Secondary | ICD-10-CM | POA: Diagnosis not present

## 2022-09-07 DIAGNOSIS — I10 Essential (primary) hypertension: Secondary | ICD-10-CM | POA: Diagnosis not present

## 2022-09-07 DIAGNOSIS — Z794 Long term (current) use of insulin: Secondary | ICD-10-CM | POA: Diagnosis not present

## 2022-09-09 ENCOUNTER — Other Ambulatory Visit: Payer: Self-pay

## 2022-09-09 ENCOUNTER — Ambulatory Visit (HOSPITAL_COMMUNITY)
Admission: EM | Admit: 2022-09-09 | Discharge: 2022-09-09 | Disposition: A | Discharge status: Home / Self Care | Payer: Medicare Other | Attending: Internal Medicine | Admitting: Internal Medicine

## 2022-09-09 ENCOUNTER — Encounter (HOSPITAL_COMMUNITY): Payer: Self-pay

## 2022-09-09 DIAGNOSIS — S29011A Strain of muscle and tendon of front wall of thorax, initial encounter: Secondary | ICD-10-CM

## 2022-09-09 DIAGNOSIS — S29019A Strain of muscle and tendon of unspecified wall of thorax, initial encounter: Secondary | ICD-10-CM

## 2022-09-09 MED ORDER — METHYLPREDNISOLONE SODIUM SUCC 125 MG IJ SOLR
INTRAMUSCULAR | Status: AC
  Filled 2022-09-09: qty 2

## 2022-09-09 MED ORDER — CYCLOBENZAPRINE HCL 10 MG PO TABS: 10.0000 mg | ORAL_TABLET | Freq: Two times a day (BID) | ORAL | 0 refills | Status: DC | PRN

## 2022-09-09 MED ORDER — ACETAMINOPHEN 325 MG PO TABS
975.0000 mg | ORAL_TABLET | Freq: Once | ORAL | Status: AC
  Administered 2022-09-09: 975 mg via ORAL

## 2022-09-09 MED ORDER — ACETAMINOPHEN 325 MG PO TABS
ORAL_TABLET | ORAL | Status: AC
  Filled 2022-09-09: qty 3

## 2022-09-09 MED ORDER — METHYLPREDNISOLONE SODIUM SUCC 125 MG IJ SOLR
80.0000 mg | Freq: Once | INTRAMUSCULAR | Status: AC
Start: 2022-09-09 — End: 2022-09-09
  Administered 2022-09-09: 80 mg via INTRAMUSCULAR

## 2022-09-09 NOTE — ED Provider Notes (Signed)
[place]    CSN: [id number] Arrival date & time: 09/09/22  1802      History   Chief Complaint No chief complaint on file.   HPI Bonnie [name] is a 66 y.o. female.   Yesterday started with pain to the left side of thechest  No shortness of breath Pain moved to the upper back/lower rib cage and lower back bilaterally History of surgery to lumbar spine and takes injections every few months Worse this morning, a little better now Has appointment November 7th at Dr. Ronnald Ramp office- called them this morning Hurts to take in a deep breath No fever No more pain to the left upper chest No recent coughing or recent viral illness History of diabetes- well controlled      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   Diabetic renal disease ([place]) 12/11/2021   Adult general medical exam 12/11/2021   Hardening of the aorta (main artery of the heart) ([place]) 12/11/2021   Hordeolum externum of right upper eyelid 08/20/2020   Glaucoma 06/23/2020   History of spinal fusion 11/27/2019   Chalazion of left upper eyelid 06/27/2019   Primary open angle glaucoma of both eyes, mild stage 06/27/2019   Hypertensive disorder 04/23/2019   Cervical radiculopathy 04/24/2018   Cystoid macular edema following cataract surgery, right eye 05/04/2017   Cervical pain 03/07/2016   Sickle cell trait ([place]) 03/07/2016   Carpal tunnel syndrome 12/07/2015   Pain in female genitalia on intercourse 09/17/2014   Nausea with vomiting 04/22/2014   Diarrhea 04/22/2014   Essential hypertension, benign 04/20/2014   Ileitis 04/19/2014   Cataract  12/25/2013   Dry eye syndrome 12/25/2013   Chronic LBP 02/27/2013   Hernia, rectovaginal 02/12/2013   Dysfunctional voiding of urine 02/12/2013   Bladder cystocele 12/06/2012   Diabetes mellitus ([place]) 12/06/2012   HLD (hyperlipidemia) 12/06/2012   Benign neoplasm of meninges ([place]) 12/06/2012   Mixed incontinence 12/06/2012   Open angle with borderline findings, high risk 10/17/2011   Chest pain, atypical 10/01/2011   Shortness of breath 10/01/2011   DM type 2 (diabetes mellitus, type 2) ([place]) 10/01/2011   Type 2 diabetes mellitus with both eyes affected by mild nonproliferative retinopathy without macular edema, with [place]-term current use of insulin ([place]) 10/01/2011   Hypercholesteremia    Anemia    Obesity    Anxiety    GERD (gastroesophageal reflux disease)     Past Surgical History:  Procedure Laterality Date   ABDOMINAL HYSTERECTOMY     BACK SURGERY     2001 for DJD   BLADDER SURGERY     BRAIN SURGERY     BRAIN SURGERY     For meningioma.   COCCYX REMOVAL     COLONOSCOPY Left 04/22/2014   Procedure: COLONOSCOPY;  Surgeon: [name], MD;  Location: [facility];  Service: Endoscopy;  Laterality: Left;   ESOPHAGOGASTRODUODENOSCOPY (EGD) WITH PROPOFOL N/A 02/12/2021   Procedure: ESOPHAGOGASTRODUODENOSCOPY (EGD) WITH PROPOFOL;  Surgeon: Carol Ada, MD;  Location: [facility];  Service:  Endoscopy;  Laterality: N/A;   SAVORY DILATION N/A 02/12/2021   Procedure: SAVORY DILATION;  Surgeon: Carol Ada, MD;  Location: [facility];  Service: Endoscopy;  Laterality: N/A;   SHOULDER SURGERY     TONSILLECTOMY      OB History   No obstetric history on file.      Home Medications    Prior to Admission medications   Medication Sig Start Date End Date Taking? Authorizing Provider  ascorbic acid (VITAMIN C) 1000 MG tablet Take 1 tablet by mouth daily.    [provider]  azithromycin (ZITHROMAX) 250 MG tablet TAKE 2 TABLETS BY MOUTH TODAY, THEN TAKE 1 TABLET  DAILY FOR 4 DAYS 08/03/21   [provider]  Blood Glucose Monitoring Suppl (FIFTY50 GLUCOSE METER 2.0) w/Device KIT OneTouch UltraMini kit  USE AS DIRECTED TO CHECK BLOOD SUGARS TWICE A DAY    [provider]  carboxymethylcellulose (REFRESH PLUS) 0.5 % SOLN Place 1 drop into both eyes 3 (three) times daily as needed.    [provider]  Cholecalciferol 125 MCG (5000 UT) TABS Take by mouth.    [provider]  clonazePAM (KLONOPIN) 1 MG tablet Take 1 mg by mouth at bedtime. 01/05/22   [provider]  clonazePAM (KLONOPIN) 1 MG tablet Take 1 tablet by mouth daily.    [provider]  clotrimazole-betamethasone (LOTRISONE) cream Apply 1 application topically daily as needed (irritation).    [provider]  conjugated estrogens (PREMARIN) vaginal cream See admin instructions. 05/07/21   [provider]  Continuous Blood Gluc Receiver ([place]) [place] See admin instructions. 01/17/22   [provider]  Continuous Blood Gluc Receiver ([place]) [place] See admin instructions. 01/14/22   [provider]  dapagliflozin propanediol (FARXIGA) 5 MG TABS tablet Take 5 mg by mouth in the morning.    [provider]  dexlansoprazole (DEXILANT) 60 MG capsule Take 60 mg by mouth in the morning.    [provider]  dorzolamide-timolol (COSOPT) 22.3-6.8 MG/ML ophthalmic solution Place 1 drop into both eyes 2 (two) times daily. 12/09/21   [provider]  doxycycline (MONODOX) 100 MG capsule Take 1 capsule by mouth 2 (two) times daily.    [provider]  doxycycline (VIBRA-TABS) 100 MG tablet TAKE 1 TABLET BY MOUTH TWICE A DAY FOR 7 DAYS    [provider]  ferrous sulfate 325 (65 FE) MG tablet 1 tablet    [provider]  insulin glargine (LANTUS) 100 UNIT/ML injection Inject 11 Units into the skin at bedtime.    [provider]  Insulin Pen Needle 31G  X 5 MM MISC USE TWICE A DAY 03/28/18   [provider]  ketoconazole (NIZORAL) 2 % cream APPLY SPARINGLY TO AFFECTED AREA TWICE A DAY    [provider]  Lancets (ONETOUCH ULTRASOFT) lancets OneTouch UltraSoft Lancets    [provider]  lidocaine (XYLOCAINE) 2 % solution TAKE 10 MLS IN MOUTH, RINSE AROUND ORAL CAVITY AS NEEDED FOR PAIN THEN SPIT OUT    [provider]  liraglutide (VICTOZA) 18 MG/3ML SOPN Inject 1.8 mg into the skin daily.    [provider]  metFORMIN (GLUCOPHAGE) 1000 MG tablet Take 1,000 mg by mouth 2 (two) times daily with a meal.    [provider]  [facility] VERIO test strip 2 (two) times daily. use for testing 04/03/19   [provider]  oxyCODONE (OXY IR/ROXICODONE) 5 MG immediate release  tablet     [provider]  ramipril (ALTACE) 5 MG capsule Take 5 mg by mouth in the morning.    [provider]  simvastatin (ZOCOR) 40 MG tablet 20 mg nightly. 1/2 tablet daily    [provider]  sucralfate (CARAFATE) 1 g tablet     [provider]  sulfamethoxazole-trimethoprim (BACTRIM DS) 800-160 MG tablet     [provider]  TURMERIC PO Take 1 capsule by mouth in the morning.    [provider]  valACYclovir (VALTREX) 1000 MG tablet Take 500 mg by mouth in the morning.    [provider]  vitamin C (ASCORBIC ACID) 500 MG tablet Take 500 mg by mouth daily.    [provider]  Vitamin D, Cholecalciferol, 25 MCG (1000 UT) TABS Take 1,000 Units by mouth daily.    [provider]    Family History Family History  Problem Relation Age of Onset   Diabetes Mother    Lung cancer Father    Diabetes Brother     Social History Social History   Tobacco Use   Smoking status: Former    Packs/day: 0.50    Years: 26.00    Total pack years: 13.00    Types: Cigarettes    Quit date: 1998    Years since quitting: 25.8   Smokeless tobacco: Never   Vaping Use   Vaping Use: Never used  Substance Use Topics   Alcohol use: Yes    Comment: occasionally   Drug use: No     Allergies   Clarithromycin, Exenatide, Ibuprofen, Methocarbamol, Shellfish allergy, Ampicillin, Carafate [sucralfate], Cephalexin, Cyanocobalamin, Glyburide, Metronidazole, Prednisone, Pregabalin, Vitamin b12, Ketoconazole, Mango flavor, Pineapple, and Strawberry flavor   Review of Systems Review of Systems   Physical Exam Triage Vital Signs ED Triage Vitals [09/09/22 1817]  Enc Vitals Group     BP (!) 154/91     Pulse Rate 82     Resp 18     Temp 98.1 F (36.7 C)     Temp Source Oral     SpO2 98 %     Weight      Height      Head Circumference      Peak Flow      Pain Score      Pain Loc      Pain Edu?      Excl. in [place]?    No data found.  Updated Vital Signs BP (!) 154/91 (BP Location: Left Arm)   Pulse 82   Temp 98.1 F (36.7 C) (Oral)   Resp 18   SpO2 98%   Visual Acuity Right Eye Distance:   Left Eye Distance:   Bilateral Distance:    Right Eye Near:   Left Eye Near:    Bilateral Near:     Physical Exam   UC Treatments / Results  Labs (all labs ordered are listed, but only abnormal results are displayed) Labs Reviewed - No data to display  EKG   Radiology No results found.  Procedures Procedures (including critical care time)  Medications Ordered in UC Medications - No data to display  Initial Impression / Assessment and Plan / UC Course  I have reviewed the triage vital signs and the nursing notes.  Pertinent labs & imaging results that were available during my care of the patient were reviewed by me and considered in my medical decision making (see chart for details).     ***  Final Clinical Impressions(s) / UC Diagnoses   Final diagnoses:  None   Discharge Instructions   None    ED Prescriptions   None    PDMP not reviewed this encounter.

## 2022-09-09 NOTE — Discharge Instructions (Addendum)
You have been evaluated in the today for your back pain. Your pain is most likely muscle strain which will improve on its own with time.   We gave you a steroid injection in the clinic to help with inflammation to your back. Monitor your blood sugars over the next couple of days and know that this medication may cause your blood sugars to increase.   You may also take flexeril muscle relaxer every 12 hours as needed for muscle spasm.  Do not take this medication and drive or drink alcohol as it can make you sleepy.  Mainly use this medicine at nighttime as needed.  Apply heat and perform gentle range of motion exercises to the area of greatest pain to prevent muscle stiffness and provide further pain relief.   Follow-up with your primary care provider or return to urgent care if your symptoms do not improve in the next 3 to 4 days with medications and interventions recommended today.  If you develop any new or worsening symptoms, please return to urgent care.  If your symptoms are severe, please go to the emergency room.  I hope you feel better!

## 2022-09-09 NOTE — ED Triage Notes (Signed)
Pt reports chest pain and throbbing that started yesterday. Pt reports left arm pain. \

## 2022-09-12 DIAGNOSIS — H401131 Primary open-angle glaucoma, bilateral, mild stage: Secondary | ICD-10-CM | POA: Diagnosis not present

## 2022-09-14 DIAGNOSIS — Z5181 Encounter for therapeutic drug level monitoring: Secondary | ICD-10-CM | POA: Diagnosis not present

## 2022-09-14 DIAGNOSIS — Z23 Encounter for immunization: Secondary | ICD-10-CM | POA: Diagnosis not present

## 2022-09-14 DIAGNOSIS — I10 Essential (primary) hypertension: Secondary | ICD-10-CM | POA: Diagnosis not present

## 2022-09-14 DIAGNOSIS — Z794 Long term (current) use of insulin: Secondary | ICD-10-CM | POA: Diagnosis not present

## 2022-09-14 DIAGNOSIS — M150 Primary generalized (osteo)arthritis: Secondary | ICD-10-CM | POA: Diagnosis not present

## 2022-09-14 DIAGNOSIS — I700 Atherosclerosis of aorta: Secondary | ICD-10-CM | POA: Diagnosis not present

## 2022-09-14 DIAGNOSIS — E782 Mixed hyperlipidemia: Secondary | ICD-10-CM | POA: Diagnosis not present

## 2022-09-14 DIAGNOSIS — E113293 Type 2 diabetes mellitus with mild nonproliferative diabetic retinopathy without macular edema, bilateral: Secondary | ICD-10-CM | POA: Diagnosis not present

## 2022-09-20 DIAGNOSIS — M5416 Radiculopathy, lumbar region: Secondary | ICD-10-CM | POA: Diagnosis not present

## 2022-09-21 DIAGNOSIS — Z23 Encounter for immunization: Secondary | ICD-10-CM | POA: Diagnosis not present

## 2022-09-26 ENCOUNTER — Ambulatory Visit: Payer: Medicare Other | Admitting: Podiatry

## 2022-09-26 ENCOUNTER — Encounter: Payer: Self-pay | Admitting: Podiatry

## 2022-09-26 DIAGNOSIS — M79675 Pain in left toe(s): Secondary | ICD-10-CM

## 2022-09-26 DIAGNOSIS — B351 Tinea unguium: Secondary | ICD-10-CM | POA: Diagnosis not present

## 2022-09-26 DIAGNOSIS — M79674 Pain in right toe(s): Secondary | ICD-10-CM

## 2022-09-26 DIAGNOSIS — E1142 Type 2 diabetes mellitus with diabetic polyneuropathy: Secondary | ICD-10-CM

## 2022-09-26 DIAGNOSIS — L84 Corns and callosities: Secondary | ICD-10-CM

## 2022-10-02 NOTE — Progress Notes (Signed)
Subjective:  Patient ID: Bonnie [name], female    DOB: Apr 10, 1956,  MRN: [id number]  Karen Chafe Leffler presents to clinic today for at risk foot care with history of diabetic neuropathy and callus(es) both feet and painful thick toenails that are difficult to trim. Painful toenails interfere with ambulation. Aggravating factors include wearing enclosed shoe gear. Pain is relieved with periodic professional debridement. Painful calluses are aggravated when weightbearing with and without shoegear. Pain is relieved with periodic professional debridement.   She is accompanied by her husband on today's visit. She continues to have neuropathic pain in her feet. Chief Complaint  Patient presents with   Nail Problem    Diabetic foot care BS- 136 A1C-7.0 PCP-[facility] PCP VST-Last week   New problem(s): None.   PCP is [name], MD.  Allergies  Allergen Reactions   Clarithromycin Shortness Of Breath, Swelling, Anxiety and Other (See Comments)    Throat swelling Other Reaction: laryngeal edema Other reaction(s): breathing issues, Other (See Comments) Throat swelling Other Reaction: laryngeal edema Other reaction(s): Other (See Comments) Throat swelling Other Reaction: laryngeal edema Throat swelling Throat swelling Other Reaction: laryngeal edema    Exenatide Anaphylaxis, Rash and Other (See Comments)     Caused Sweating, sores in the mouth  Also gum soreness/irritation  Other reaction(s): Other (See Comments) Also gum soreness/irritation Caused Sweating, sores in the mouth  Caused Sweating, sores in the mouth  Also gum soreness/irritation  Caused Sweating, sores in the mouth  Also gum soreness/irritation Caused Sweating, sores in the mouth    Ibuprofen Rash and Other (See Comments)    Nose bleed   Methocarbamol Anaphylaxis and Swelling    Swollen eyes   Shellfish Allergy Nausea And Vomiting   Ampicillin Nausea And Vomiting   Carafate [Sucralfate] Nausea And Vomiting    Cephalexin     Other reaction(s): breathing issues Other reaction(s): breathing issues   Cyanocobalamin    Glyburide     Other reaction(s): ?? Other reaction(s): ??   Metronidazole Nausea And Vomiting, Nausea Only and Swelling    Pt stated, "Eyes swelled up"   Prednisone     Other reaction(s): BS fluctuations Other reaction(s): BS fluctuations   Pregabalin     Other reaction(s): disoriented Other reaction(s): disoriented   Vitamin B12 Swelling   Ketoconazole Nausea And Vomiting and Palpitations    Other reaction(s): Irregular Heart Rate  Other reaction(s): Other (See Comments) Other reaction(s): Irregular Heart Rate Other reaction(s): Irregular Heart Rate   Mango Flavor Itching and Rash   Pineapple Rash   Strawberry Flavor Itching    Review of Systems: Negative except as noted in the HPI.  Objective: No changes noted in today's physical examination.  Kynadi Dragos Nakata is a pleasant 66 y.o. female WD, WN in NAD. AAO x 3. Vascular Examination: CFT immediate b/l LE. Palpable DP/PT pulses b/l LE. Digital hair sparse b/l. Skin temperature gradient WNL b/l. No pain with calf compression b/l. No edema noted b/l. No cyanosis or clubbing noted b/l LE.  Dermatological Examination: Pedal integument with normal turgor, texture and tone BLE. No open wounds b/l LE. No interdigital macerations noted b/l LE. Toenails 2-5 b/l well maintained with adequate length. No erythema, no edema, no drainage, no fluctuance. Anonychia noted bilateral great toes. Nailbed(s) epithelialized.   Hyperkeratotic lesion(s) submet head 1 b/l and submet head 5 b/l.  No erythema, no edema, no drainage, no fluctuance.  Musculoskeletal Examination: Normal muscle strength 5/5 to all lower extremity muscle groups bilaterally. No pain,  crepitus or joint limitation noted with ROM b/l LE. No gross bony pedal deformities b/l. Patient ambulates independently without assistive aids.  Neurological Examination: Pt has  subjective symptoms of neuropathy. Protective sensation intact 5/5 intact bilaterally with 10g monofilament b/l.  Assessment/Plan: 1. Pain due to onychomycosis of toenails of both feet   2. Callus   3. Diabetic peripheral neuropathy associated with type 2 diabetes mellitus ([place])     No orders of the defined types were placed in this encounter.   -Consent given for treatment as described below: -Continue diabetic foot care principles: inspect feet daily, monitor glucose as recommended by PCP and/or Endocrinologist, and follow prescribed diet per PCP, Endocrinologist and/or dietician. -Continue supportive shoe gear daily. -Mycotic toenails 2-5 bilaterally were debrided in length and girth with sterile nail nippers and dremel without iatrogenic bleeding. -Callus(es) submet head 1 left foot, submet head 1 right foot, submet head 5 left foot, and submet head 5 right foot pared utilizing sterile scalpel blade without complication or incident. Total number debrided =4. -Patient/POA to call should there be question/concern in the interim.   Return in about 3 months (around 12/27/2022).  Marzetta Board, DPM

## 2022-10-13 DIAGNOSIS — M47816 Spondylosis without myelopathy or radiculopathy, lumbar region: Secondary | ICD-10-CM | POA: Diagnosis not present

## 2022-10-17 DIAGNOSIS — Z1231 Encounter for screening mammogram for malignant neoplasm of breast: Secondary | ICD-10-CM | POA: Diagnosis not present

## 2022-11-02 DIAGNOSIS — Z794 Long term (current) use of insulin: Secondary | ICD-10-CM | POA: Diagnosis not present

## 2022-11-02 DIAGNOSIS — H401131 Primary open-angle glaucoma, bilateral, mild stage: Secondary | ICD-10-CM | POA: Diagnosis not present

## 2022-11-02 DIAGNOSIS — E113291 Type 2 diabetes mellitus with mild nonproliferative diabetic retinopathy without macular edema, right eye: Secondary | ICD-10-CM | POA: Diagnosis not present

## 2022-11-02 DIAGNOSIS — E113212 Type 2 diabetes mellitus with mild nonproliferative diabetic retinopathy with macular edema, left eye: Secondary | ICD-10-CM | POA: Diagnosis not present

## 2022-11-02 DIAGNOSIS — H0288B Meibomian gland dysfunction left eye, upper and lower eyelids: Secondary | ICD-10-CM | POA: Diagnosis not present

## 2022-11-02 DIAGNOSIS — Z961 Presence of intraocular lens: Secondary | ICD-10-CM | POA: Diagnosis not present

## 2022-11-02 DIAGNOSIS — H0288A Meibomian gland dysfunction right eye, upper and lower eyelids: Secondary | ICD-10-CM | POA: Diagnosis not present

## 2022-11-21 DIAGNOSIS — H93293 Other abnormal auditory perceptions, bilateral: Secondary | ICD-10-CM | POA: Diagnosis not present

## 2022-12-12 ENCOUNTER — Encounter: Payer: Self-pay | Admitting: Podiatry

## 2022-12-12 ENCOUNTER — Ambulatory Visit: Payer: Medicare Other | Admitting: Podiatry

## 2022-12-12 VITALS — BP 129/63 | HR 70

## 2022-12-12 DIAGNOSIS — M5416 Radiculopathy, lumbar region: Secondary | ICD-10-CM | POA: Diagnosis not present

## 2022-12-12 DIAGNOSIS — E1142 Type 2 diabetes mellitus with diabetic polyneuropathy: Secondary | ICD-10-CM | POA: Diagnosis not present

## 2022-12-12 DIAGNOSIS — B3731 Acute candidiasis of vulva and vagina: Secondary | ICD-10-CM | POA: Diagnosis not present

## 2022-12-12 NOTE — Progress Notes (Signed)
  Subjective:  Patient ID: Bonnie [name], female    DOB: [birth year],   MRN: [id number]  Chief Complaint  Patient presents with   Diabetes    Diabetic foot care, Neuropathy, bilateral foot pain in the hallux, numbness with a shooting pain.    67 y.o. female presents for concern of numbness and pain in both of her great toes. Relates this happened after getting back into walking. Relates it was very painful yesterday but today doing much better. Does relates some back pain as well  She is diabetic and has history of neuropathy. Appears to be allergic to pregabalin and has been taking Vitamin B12 and Zinc. She also has a history of lumbar degenerative discs.  Denies any other pedal complaints. Denies n/v/f/c.   Past Medical History:  Diagnosis Date   Anemia    Angina    Anxiety    Carpal tunnel syndrome 12/07/2015   Bilateral   Cyst of brain 2005   Diabetes mellitus    GERD (gastroesophageal reflux disease)    Hypercholesteremia    Hypertension    Obesity    Wears dentures    full set    Objective:  Physical Exam: Vascular: DP/PT pulses 2/4 bilateral. CFT <3 seconds. Absent hair growth on digits. Edema noted to bilateral lower extremities. Xerosis noted bilaterally.  Skin. No lacerations or abrasions bilateral feet. Nails 2-5 bilateral  are thickened discolored  with subungual debris. Hallux nails previously removed.  Musculoskeletal: MMT 5/5 bilateral lower extremities in DF, PF, Inversion and Eversion. Deceased ROM in DF of ankle joint. No tenderness to palpation.  Neurological: Sensation intact to light touch. Protective sensation diminished bilateral.    Assessment:   1. Diabetic peripheral neuropathy associated with type 2 diabetes mellitus ([place])   2. Lumbar radiculopathy      Plan:  Patient was evaluated and treated and all questions answered. Discussed neuropathy and radiculopathy and etiology as well as treatment with patient.  Radiographs reviewed and discussed with  patient.  -Discussed and educated patient on diabetic foot care, especially with  regards to the vascular, neurological and musculoskeletal systems.  -Stressed the importance of good glycemic control and the detriment of not  controlling glucose levels in relation to the foot. -Discussed supportive shoes at all times and checking feet regularly.  -Discussed this likely was irritation from the back after a walk. Discussed stretching and easing back into activity. She is also scheduled in a couple weeks for back injection which will likely help.  -Patient to return as scheduled for rfc.    Lorenda Peck, DPM

## 2022-12-23 ENCOUNTER — Encounter: Payer: Self-pay | Admitting: Obstetrics and Gynecology

## 2022-12-23 ENCOUNTER — Ambulatory Visit: Payer: Medicare Other | Admitting: Obstetrics and Gynecology

## 2022-12-23 VITALS — BP 124/72 | HR 70 | Ht 68.0 in | Wt 197.0 lb

## 2022-12-23 DIAGNOSIS — N393 Stress incontinence (female) (male): Secondary | ICD-10-CM

## 2022-12-23 DIAGNOSIS — R350 Frequency of micturition: Secondary | ICD-10-CM

## 2022-12-23 DIAGNOSIS — N398 Other specified disorders of urinary system: Secondary | ICD-10-CM

## 2022-12-23 DIAGNOSIS — N993 Prolapse of vaginal vault after hysterectomy: Secondary | ICD-10-CM

## 2022-12-23 DIAGNOSIS — N816 Rectocele: Secondary | ICD-10-CM

## 2022-12-23 DIAGNOSIS — R82998 Other abnormal findings in urine: Secondary | ICD-10-CM

## 2022-12-23 DIAGNOSIS — N3941 Urge incontinence: Secondary | ICD-10-CM | POA: Diagnosis not present

## 2022-12-23 DIAGNOSIS — N8110 Cystocele, unspecified: Secondary | ICD-10-CM

## 2022-12-23 LAB — POCT URINALYSIS DIPSTICK
Bilirubin, UA: NEGATIVE
Blood, UA: NEGATIVE
Glucose, UA: POSITIVE — AB
Ketones, UA: NEGATIVE
Leukocytes, UA: NEGATIVE
Nitrite, UA: NEGATIVE
Protein, UA: NEGATIVE
Spec Grav, UA: 1.01 (ref 1.010–1.025)
Urobilinogen, UA: 0.2 E.U./dL
pH, UA: 5.5 (ref 5.0–8.0)
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
## 2022-12-23 NOTE — Patient Instructions (Signed)

## 2022-12-27 DIAGNOSIS — M47816 Spondylosis without myelopathy or radiculopathy, lumbar region: Secondary | ICD-10-CM | POA: Diagnosis not present

## 2023-01-02 ENCOUNTER — Encounter: Payer: Self-pay | Admitting: *Deleted

## 2023-01-11 ENCOUNTER — Encounter: Payer: Self-pay | Admitting: Obstetrics and Gynecology

## 2023-01-11 ENCOUNTER — Ambulatory Visit (INDEPENDENT_AMBULATORY_CARE_PROVIDER_SITE_OTHER): Payer: Medicare Other | Admitting: Obstetrics and Gynecology

## 2023-01-11 VITALS — BP 148/81 | HR 78

## 2023-01-11 DIAGNOSIS — R350 Frequency of micturition: Secondary | ICD-10-CM

## 2023-01-11 DIAGNOSIS — N393 Stress incontinence (female) (male): Secondary | ICD-10-CM

## 2023-01-11 DIAGNOSIS — N3941 Urge incontinence: Secondary | ICD-10-CM

## 2023-01-11 DIAGNOSIS — N398 Other specified disorders of urinary system: Secondary | ICD-10-CM

## 2023-01-11 LAB — POCT URINALYSIS DIPSTICK
Bilirubin, UA: NEGATIVE
Blood, UA: NEGATIVE
Glucose, UA: POSITIVE — AB
Ketones, UA: NEGATIVE
Leukocytes, UA: NEGATIVE
Nitrite, UA: NEGATIVE
Protein, UA: NEGATIVE
Spec Grav, UA: <=1.005 — AB (ref 1.010–1.025)
Urobilinogen, UA: 0.2 E.U./dL
pH, UA: 5 (ref 5.0–8.0)

## 2023-01-11 NOTE — Patient Instructions (Signed)
Taking Care of Yourself after Urodynamics  Drink plenty of water for a day or two following your procedure. Try to have about 8 ounces (one cup) at a time, and do this 6 times or more per day unless you have fluid restrictitons AVOID irritative beverages such as coffee, tea, soda, alcoholic or citrus drinks for a day or two, as this may cause burning with urination.  You may experience some discomfort or a burning sensation with urination after having this procedure. You can use over the counter Azo or pyridium to help with burning and follow the instructions on the packaging. If it does not improve within 1-2 days, or other symptoms appear (fever, chills, or difficulty urinating) call the office to speak to a nurse.  You may return to normal daily activities such as work, school, driving, exercising and housework on the day of the procedure.

## 2023-01-11 NOTE — Progress Notes (Signed)
[facility] Urodynamics Procedure  Referring Physician: [name], MD Date of Procedure: 01/11/2023  Bonnie [name] is a 67 y.o. female who presents for urodynamic evaluation. Indication(s) for study: incomplete bladder emptying   Vital Signs: BP (!) 148/81   Pulse 78   Laboratory Results: A catheterized urine specimen revealed:  POC urine: Positive glucose, negative for other components   Voiding Diary: Not done  Procedure Timeout:  The correct patient was verified and the correct procedure was verified. The patient was in the correct position and safety precautions were reviewed based on at the patient's history.  Urodynamic Procedure A 823F dual lumen urodynamics catheter was placed under sterile conditions into the patient's bladder. A 823F catheter was placed into the rectum in order to measure abdominal pressure. EMG patches were placed in the appropriate position.  All connections were confirmed and calibrations/adjusted made. Saline was instilled into the bladder through the dual lumen catheters.  Cough/valsalva pressures were measured periodically during filling.  Patient was allowed to void.  The bladder was then emptied of its residual.  UROFLOW: Revealed a Qmax of 26.9 mL/sec.  She voided 473.6 mL and had a residual of 50 mL.  It was a normal pattern and represented normal habits.  CMG: This was performed with sterile water in the sitting position at a fill rate of 30 mL/min.    First sensation of fullness was 63 mLs,  First urge was 144 mLs,  Strong urge was 249 mLs and  Capacity was 537 mLs  Stress incontinence was demonstrated *while standing Highest positive Barrier CLPP was 101 cmH20 at 353 ml. While standing Highest positive Barrier VLPP was 59 cmH20 at 353 ml.while standing  Detrusor function was normal, with no phasic contractions seen.    Compliance:  Normal. End fill detrusor pressure was 0 cmH20.   UPP: MUCP with barrier reduction  was 41 cm of water.    MICTURITION STUDY: Voiding was performed with reduction using scopettes in the sitting position.  Pdet at Qmax was 12.9 cm of water.  Qmax was 9 mL/sec.  It was a intermittent pattern.  She voided 407 mL and had a residual of 130 mL.  It was a volitional void, sustained detrusor contraction was present and abdominal straining was not present  EMG: This was performed with patches.  She had voluntary contractions, recruitment with fill was present and urethral sphincter was not relaxed with void.  The details of the procedure with the study tracings have been scanned into EPIC.   Urodynamic Impression:  1. Sensation was normal; capacity was normal 2. Stress Incontinence was demonstrated at normal pressures, but only while standing; 3. Detrusor Overactivity was not demonstrated. 4. Emptying was dysfunctional with a normal PVR, a sustained detrusor contraction present,  abdominal straining not present, dyssynergic urethral sphincter activity on EMG.  Plan: - The patient will follow up  to discuss the findings and treatment options.

## 2023-01-14 ENCOUNTER — Telehealth (HOSPITAL_BASED_OUTPATIENT_CLINIC_OR_DEPARTMENT_OTHER): Payer: Self-pay | Admitting: Obstetrics & Gynecology

## 2023-01-14 DIAGNOSIS — N3090 Cystitis, unspecified without hematuria: Secondary | ICD-10-CM

## 2023-01-14 MED ORDER — NITROFURANTOIN MONOHYD MACRO 100 MG PO CAPS
100.0000 mg | ORAL_CAPSULE | Freq: Two times a day (BID) | ORAL | 0 refills | Status: DC
Start: 2023-01-14 — End: 2023-05-30

## 2023-01-14 NOTE — Telephone Encounter (Signed)
Pt called on call provider.  She had urodynamic testing done 2/28.  Last night started feeling more urinary urgency and pins and needles sensation.  The symptoms have worsened.  She is not seeing blood.  She has increased water and started OTC URI calm.  Symptoms are some better however not resolved.  Doesn't know if she can keep taking the OTC URI calm.  She does have an extensive medication allergy list.  Reviewed with pt.  Feel should start macrobid '100mg'$  since had urodynamics and symptoms are worse.  Pharmacy confirmed.  Macrobid '100mg'$  bid x 5 days.  Medication list reviewed.  She is also taking doxycycline '50mg'$  daily due to styes around eyes.  Advised pt it is ok to take these together.  Renal function is normal.  Advised pt to call back if symptoms do not improve.  Routing note to Dr. Wannetta Sender as Juluis Rainier.

## 2023-01-16 ENCOUNTER — Telehealth: Payer: Self-pay | Admitting: Obstetrics and Gynecology

## 2023-01-16 NOTE — Telephone Encounter (Signed)
Patient called reporting concerns about her medication. She reports she was concerned about when to take the Macrobid related to her UTI symptoms. Encouraged patient to taken the medication With food to not upset her stomach. She reports overall she is feeling better. She started the medication on Saturday. She has a F/u in the office scheduled for Thursday and if she has recurrent symptoms will culture a urine sample.

## 2023-01-17 DIAGNOSIS — D329 Benign neoplasm of meninges, unspecified: Secondary | ICD-10-CM | POA: Diagnosis not present

## 2023-01-18 ENCOUNTER — Ambulatory Visit: Payer: Medicare Other | Admitting: Podiatry

## 2023-01-20 ENCOUNTER — Ambulatory Visit: Payer: Medicare Other | Admitting: Obstetrics and Gynecology

## 2023-01-20 ENCOUNTER — Encounter: Payer: Self-pay | Admitting: Obstetrics and Gynecology

## 2023-01-20 VITALS — BP 129/76 | HR 71

## 2023-01-20 DIAGNOSIS — Z01818 Encounter for other preprocedural examination: Secondary | ICD-10-CM

## 2023-01-20 DIAGNOSIS — R350 Frequency of micturition: Secondary | ICD-10-CM | POA: Diagnosis not present

## 2023-01-20 DIAGNOSIS — N816 Rectocele: Secondary | ICD-10-CM

## 2023-01-20 DIAGNOSIS — N8110 Cystocele, unspecified: Secondary | ICD-10-CM | POA: Diagnosis not present

## 2023-01-20 LAB — POCT URINALYSIS DIPSTICK
Bilirubin, UA: NEGATIVE
Blood, UA: NEGATIVE
Glucose, UA: POSITIVE — AB
Ketones, UA: NEGATIVE
Leukocytes, UA: NEGATIVE
Nitrite, UA: NEGATIVE
Protein, UA: NEGATIVE
Spec Grav, UA: 1.02 (ref 1.010–1.025)
Urobilinogen, UA: 0.2 E.U./dL
pH, UA: 6 (ref 5.0–8.0)

## 2023-01-20 MED ORDER — IBUPROFEN 600 MG PO TABS: 600.0000 mg | ORAL_TABLET | Freq: Four times a day (QID) | ORAL | 0 refills | Status: DC | PRN

## 2023-01-20 MED ORDER — ACETAMINOPHEN 500 MG PO TABS: 500.0000 mg | ORAL_TABLET | Freq: Four times a day (QID) | ORAL | 0 refills | Status: DC | PRN

## 2023-01-20 MED ORDER — POLYETHYLENE GLYCOL 3350 17 GM/SCOOP PO POWD: 17.0000 g | Freq: Every day | ORAL | 0 refills | Status: DC

## 2023-01-20 MED ORDER — TRAMADOL HCL 50 MG PO TABS: 50.0000 mg | ORAL_TABLET | Freq: Three times a day (TID) | ORAL | 0 refills | Status: AC | PRN

## 2023-01-20 NOTE — Progress Notes (Signed)
[facility] Pre-Operative visit  Subjective Chief Complaint: Bonnie [name] presents for a preoperative encounter.   History of Present Illness: Bonnie [name] is a 67 y.o. female who presents for preoperative visit.  She is scheduled to undergo Exam under anesthesia, anterior and posterior repair with perineorrhaphy and sacrospinous ligament fixation, cystoscopy on 02/13/23.  Her symptoms include vaginal bulge, and she was was found to have Stage II anterior, Stage II posterior, Stage I apical prolapse.   Urodynamic Impression:  1. Sensation was normal; capacity was normal 2. Stress Incontinence was demonstrated at normal pressures, but only while standing; 3. Detrusor Overactivity was not demonstrated. 4. Emptying was dysfunctional with a normal PVR (13m), a sustained detrusor contraction present,  abdominal straining not present, dyssynergic urethral sphincter activity on EMG.  We reviewed the results of the urodynamic testing. She has a history of permanent suture erosion so is not interested in a sling. We discussed the option of urethral bulking, especially because she is not leaking that often. She wants to wait until after the prolapse repair to decide on the bulking.   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   COCCYX REMOVAL     COLONOSCOPY Left 04/22/2014   Procedure: COLONOSCOPY;  Surgeon: PBeryle Beams MD;  Location[facility]  Service: Endoscopy;  Laterality: Left;   ESOPHAGOGASTRODUODENOSCOPY (EGD) WITH PROPOFOL N/A 02/12/2021   Procedure:  ESOPHAGOGASTRODUODENOSCOPY (EGD) WITH PROPOFOL;  Surgeon: HCarol Ada MD;  Location: [facility];  Service: Endoscopy;  Laterality: N/A;   SAVORY DILATION N/A 02/12/2021   Procedure: SAVORY DILATION;  Surgeon: HCarol Ada MD;  Location: [facility];  Service: Endoscopy;  Laterality: N/A;   SHOULDER SURGERY     TONSILLECTOMY     VAGINAL HYSTERECTOMY      is allergic to clarithromycin, exenatide, ibuprofen, methocarbamol, shellfish allergy, ampicillin, carafate [sucralfate], cephalexin, cyanocobalamin, glyburide, metronidazole, prednisone, pregabalin, vitamin b12, ketoconazole, mango flavor, pineapple, and strawberry flavor.   Family History  Problem Relation Age of Onset   Diabetes Mother    Lung cancer Father    Diabetes Brother     Social History   Tobacco Use   Smoking status: Former    Packs/day: 0.50    Years: 26.00    Total pack years: 13.00    Types: Cigarettes    Quit date: 1998    Years since quitting: 26.2   Smokeless tobacco: Never  Vaping Use   Vaping Use: Never used  Substance Use Topics   Alcohol use: Yes    Comment: occasionally   Drug use: No     Review of Systems was negative for a full 10 system review except as noted in the History of Present Illness.   Current Outpatient Medications:    acetaminophen (TYLENOL) 500 MG tablet, Take 1 tablet (500 mg total) by mouth every 6 (six) hours as needed (pain)., Disp: 30 tablet, Rfl:  0   ascorbic acid (VITAMIN C) 1000 MG tablet, Take 1 tablet by mouth daily., Disp: , Rfl:    Blood Glucose Monitoring Suppl (FIFTY50 GLUCOSE METER 2.0) w/Device KIT, OneTouch UltraMini kit  USE AS DIRECTED TO CHECK BLOOD SUGARS TWICE A DAY, Disp: , Rfl:    carboxymethylcellulose (REFRESH PLUS) 0.5 % SOLN, Place 1 drop into both eyes 3 (three) times daily as needed., Disp: , Rfl:    Cholecalciferol 125 MCG (5000 UT) TABS, Take by mouth., Disp: , Rfl:    clonazePAM (KLONOPIN) 1 MG tablet, Take 1 mg by mouth at bedtime., Disp: ,  Rfl:    clotrimazole-betamethasone (LOTRISONE) cream, Apply 1 application  topically daily as needed (irritation)., Disp: , Rfl:    conjugated estrogens (PREMARIN) vaginal cream, See admin instructions., Disp: , Rfl:    dapagliflozin propanediol (FARXIGA) 5 MG TABS tablet, Take 5 mg by mouth in the morning., Disp: , Rfl:    dorzolamide-timolol (COSOPT) 22.3-6.8 MG/ML ophthalmic solution, Place 1 drop into both eyes 2 (two) times daily., Disp: , Rfl:    ferrous sulfate 325 (65 FE) MG tablet, 1 tablet, Disp: , Rfl:    ibuprofen (ADVIL) 600 MG tablet, Take 1 tablet (600 mg total) by mouth every 6 (six) hours as needed., Disp: 30 tablet, Rfl: 0   insulin glargine (LANTUS) 100 UNIT/ML injection, Inject 11 Units into the skin at bedtime., Disp: , Rfl:    Insulin Pen Needle 31G X 5 MM MISC, USE TWICE A DAY, Disp: , Rfl:    Lancets (ONETOUCH ULTRASOFT) lancets, OneTouch UltraSoft Lancets, Disp: , Rfl:    liraglutide (VICTOZA) 18 MG/3ML SOPN, Inject 1.8 mg into the skin daily., Disp: , Rfl:    metFORMIN (GLUCOPHAGE) 1000 MG tablet, Take 1,000 mg by mouth 2 (two) times daily with a meal., Disp: , Rfl:    nitrofurantoin, macrocrystal-monohydrate, (MACROBID) 100 MG capsule, Take 1 capsule (100 mg total) by mouth 2 (two) times daily., Disp: 10 capsule, Rfl: 0   ONETOUCH VERIO test strip, 2 (two) times daily. use for testing, Disp: , Rfl:    polyethylene glycol powder (GLYCOLAX/MIRALAX) 17 GM/SCOOP powder, Take 17 g by mouth daily. Drink 17g (1 scoop) dissolved in water per day., Disp: 255 g, Rfl: 0   traMADol (ULTRAM) 50 MG tablet, Take 1 tablet (50 mg total) by mouth every 8 (eight) hours as needed for up to 5 days., Disp: 5 tablet, Rfl: 0   TURMERIC PO, Take 1 capsule by mouth in the morning., Disp: , Rfl:    valACYclovir (VALTREX) 1000 MG tablet, Take 500 mg by mouth in the morning., Disp: , Rfl:    Objective Vitals:   01/20/23 1413  BP: 129/76  Pulse: 71    Gen: NAD CV: S1 S2 RRR Lungs: Clear to  auscultation bilaterally Abd: soft, nontender   Previous Pelvic Exam showed: POP-Q   -1                                            Aa   -1                                           Ba   -3  C    4.5                                            Gh   3                                            Pb   5.5                                            tvl    0                                            Ap   0                                            Bp                                                  D           Assessment/ Plan  Assessment: The patient is a 67 y.o. year old scheduled to undergo Exam under anesthesia, anterior and posterior repair with perineorrhaphy and sacrospinous ligament fixation, cystoscopy. Verbal consent was obtained for these procedures.  Plan: General Surgical Consent: The patient has previously been counseled on alternative treatments, and the decision by the patient and provider was to proceed with the procedure listed above.  For all procedures, there are risks of bleeding, infection, damage to surrounding organs including but not limited to bowel, bladder, blood vessels, ureters and nerves, and need for further surgery if an injury were to occur. These risks are all low with minimally invasive surgery.   There are risks of numbness and weakness at any body site or buttock/rectal pain.  It is possible that baseline pain can be worsened by surgery, either with or without mesh. If surgery is vaginal, there is also a low risk of possible conversion to laparoscopy or open abdominal incision where indicated. Very rare risks include blood transfusion, blood clot, heart attack, pneumonia, or death.   There is also a risk of short-term postoperative urinary retention with need to use a catheter. About half of patients need to go home from surgery with a catheter, which is then later removed in the office. The risk  of Boehner-term need for a catheter is very low. There is also a risk of worsening of overactive bladder.     Prolapse (with or without mesh): Risk factors for surgical failure  include things that put pressure on your pelvis and the surgical repair, including obesity, chronic cough, and heavy lifting or straining (including lifting children or adults, straining on the toilet, or lifting heavy objects such as furniture or anything weighing >25 lbs. Risks of recurrence is 20-30%  with vaginal native tissue repair and a less than 10% with sacrocolpopexy with mesh.    We discussed consent for blood products. Risks for blood transfusion include allergic reactions, other reactions that can affect different body organs and managed accordingly, transmission of infectious diseases such as HIV or Hepatitis. However, the blood is screened. Patient consents for blood products.  Pre-operative instructions:  She was instructed to not take Aspirin/NSAIDs x 7days prior to surgery.  Antibiotic prophylaxis was ordered as indicated.  Catheter use: Patient will go home with foley if needed after post-operative voiding trial.  Post-operative instructions:  She was provided with specific post-operative instructions, including precautions and signs/symptoms for which we would recommend contacting us, in addition to daytime and after-hours contact phone numbers. This was provided on a handout.   Post-operative medications: Prescriptions for motrin, tylenol, miralax, and oxycodone were sent to her pharmacy. Discussed using ibuprofen and tylenol on a schedule to limit use of narcotics.   Laboratory testing: Will obtain A1c from PCP- has labs scheduled next week  Preoperative clearance:  She does not require surgical clearance.    Post-operative follow-up:  A post-operative appointment will be made for 6 weeks from the date of surgery. If she needs a post-operative nurse visit for a voiding trial, that will be set up after  she leaves the hospital.    Patient will call the clinic or use MyChart should anything change or any new issues arise.   Jaquita Folds, MD  Time spent: I spent 40 minutes dedicated to the care of this patient on the date of this encounter to include pre-visit review of records, face-to-face time with the patient discussing surgery and post visit documentation and ordering medication/ testing.

## 2023-01-21 ENCOUNTER — Encounter: Payer: Self-pay | Admitting: Obstetrics and Gynecology

## 2023-01-24 DIAGNOSIS — I700 Atherosclerosis of aorta: Secondary | ICD-10-CM | POA: Diagnosis not present

## 2023-01-24 DIAGNOSIS — E782 Mixed hyperlipidemia: Secondary | ICD-10-CM | POA: Diagnosis not present

## 2023-01-24 DIAGNOSIS — M150 Primary generalized (osteo)arthritis: Secondary | ICD-10-CM | POA: Diagnosis not present

## 2023-01-24 DIAGNOSIS — I10 Essential (primary) hypertension: Secondary | ICD-10-CM | POA: Diagnosis not present

## 2023-01-24 DIAGNOSIS — E1165 Type 2 diabetes mellitus with hyperglycemia: Secondary | ICD-10-CM | POA: Diagnosis not present

## 2023-01-24 DIAGNOSIS — Z794 Long term (current) use of insulin: Secondary | ICD-10-CM | POA: Diagnosis not present

## 2023-01-24 DIAGNOSIS — E113293 Type 2 diabetes mellitus with mild nonproliferative diabetic retinopathy without macular edema, bilateral: Secondary | ICD-10-CM | POA: Diagnosis not present

## 2023-01-24 DIAGNOSIS — Z5181 Encounter for therapeutic drug level monitoring: Secondary | ICD-10-CM | POA: Diagnosis not present

## 2023-01-24 NOTE — H&P (Signed)
[facility] Pre-Operative visit  Subjective Chief Complaint: Bonnie [name] presents for a preoperative encounter.   History of Present Illness: Bonnie [name] is a 67 y.o. female who presents for preoperative visit.  She is scheduled to undergo Exam under anesthesia, anterior and posterior repair with perineorrhaphy and sacrospinous ligament fixation, cystoscopy on 02/13/23.  Her symptoms include vaginal bulge, and she was was found to have Stage II anterior, Stage II posterior, Stage I apical prolapse.   Urodynamic Impression:  1. Sensation was normal; capacity was normal 2. Stress Incontinence was demonstrated at normal pressures, but only while standing; 3. Detrusor Overactivity was not demonstrated. 4. Emptying was dysfunctional with a normal PVR (136m), a sustained detrusor contraction present,  abdominal straining not present, dyssynergic urethral sphincter activity on EMG.  We reviewed the results of the urodynamic testing. She has a history of permanent suture erosion so is not interested in a sling. We discussed the option of urethral bulking, especially because she is not leaking that often. She wants to wait until after the prolapse repair to decide on the bulking.   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   COCCYX REMOVAL     COLONOSCOPY Left 04/22/2014   Procedure: COLONOSCOPY;  Surgeon: PBeryle Beams MD;  [facility]  Service: Endoscopy;  Laterality: Left;   ESOPHAGOGASTRODUODENOSCOPY (EGD) WITH PROPOFOL N/A 02/12/2021   Procedure:  ESOPHAGOGASTRODUODENOSCOPY (EGD) WITH PROPOFOL;  Surgeon: HCarol Ada MD;  [facility];  Service: Endoscopy;  Laterality: N/A;   SAVORY DILATION N/A 02/12/2021   Procedure: SAVORY DILATION;  Surgeon: HCarol Ada MD;  Location: [facility];  Service: Endoscopy;  Laterality: N/A;   SHOULDER SURGERY     TONSILLECTOMY     VAGINAL HYSTERECTOMY      is allergic to clarithromycin, exenatide, ibuprofen, methocarbamol, shellfish allergy, ampicillin, carafate [sucralfate], cephalexin, cyanocobalamin, glyburide, metronidazole, prednisone, pregabalin, vitamin b12, ketoconazole, mango flavor, pineapple, and strawberry flavor.   Family History  Problem Relation Age of Onset   Diabetes Mother    Lung cancer Father    Diabetes Brother     Social History   Tobacco Use   Smoking status: Former    Packs/day: 0.50    Years: 26.00    Total pack years: 13.00    Types: Cigarettes    Quit date: 1998    Years since quitting: 26.2   Smokeless tobacco: Never  Vaping Use   Vaping Use: Never used  Substance Use Topics   Alcohol use: Yes    Comment: occasionally   Drug use: No     Review of Systems was negative for a full 10 system review except as noted in the History of Present Illness.  No current facility-administered medications for this encounter.  Current Outpatient Medications:    acetaminophen (TYLENOL) 500 MG tablet, Take 1 tablet (500 mg total) by mouth every 6 (six) hours  as needed (pain)., Disp: 30 tablet, Rfl: 0   ascorbic acid (VITAMIN C) 1000 MG tablet, Take 1 tablet by mouth daily., Disp: , Rfl:    Blood Glucose Monitoring Suppl (FIFTY50 GLUCOSE METER 2.0) w/Device KIT, OneTouch UltraMini kit  USE AS DIRECTED TO CHECK BLOOD SUGARS TWICE A DAY, Disp: , Rfl:    carboxymethylcellulose (REFRESH PLUS) 0.5 % SOLN, Place 1 drop into both eyes 3 (three) times daily as needed., Disp: , Rfl:    Cholecalciferol 125 MCG (5000 UT) TABS, Take by mouth., Disp: , Rfl:     clonazePAM (KLONOPIN) 1 MG tablet, Take 1 mg by mouth at bedtime., Disp: , Rfl:    clotrimazole-betamethasone (LOTRISONE) cream, Apply 1 application  topically daily as needed (irritation)., Disp: , Rfl:    conjugated estrogens (PREMARIN) vaginal cream, See admin instructions., Disp: , Rfl:    dapagliflozin propanediol (FARXIGA) 5 MG TABS tablet, Take 5 mg by mouth in the morning., Disp: , Rfl:    dorzolamide-timolol (COSOPT) 22.3-6.8 MG/ML ophthalmic solution, Place 1 drop into both eyes 2 (two) times daily., Disp: , Rfl:    ferrous sulfate 325 (65 FE) MG tablet, 1 tablet, Disp: , Rfl:    ibuprofen (ADVIL) 600 MG tablet, Take 1 tablet (600 mg total) by mouth every 6 (six) hours as needed., Disp: 30 tablet, Rfl: 0   insulin glargine (LANTUS) 100 UNIT/ML injection, Inject 11 Units into the skin at bedtime., Disp: , Rfl:    Insulin Pen Needle 31G X 5 MM MISC, USE TWICE A DAY, Disp: , Rfl:    Lancets (ONETOUCH ULTRASOFT) lancets, OneTouch UltraSoft Lancets, Disp: , Rfl:    liraglutide (VICTOZA) 18 MG/3ML SOPN, Inject 1.8 mg into the skin daily., Disp: , Rfl:    metFORMIN (GLUCOPHAGE) 1000 MG tablet, Take 1,000 mg by mouth 2 (two) times daily with a meal., Disp: , Rfl:    nitrofurantoin, macrocrystal-monohydrate, (MACROBID) 100 MG capsule, Take 1 capsule (100 mg total) by mouth 2 (two) times daily., Disp: 10 capsule, Rfl: 0   ONETOUCH VERIO test strip, 2 (two) times daily. use for testing, Disp: , Rfl:    polyethylene glycol powder (GLYCOLAX/MIRALAX) 17 GM/SCOOP powder, Take 17 g by mouth daily. Drink 17g (1 scoop) dissolved in water per day., Disp: 255 g, Rfl: 0   traMADol (ULTRAM) 50 MG tablet, Take 1 tablet (50 mg total) by mouth every 8 (eight) hours as needed for up to 5 days., Disp: 5 tablet, Rfl: 0   TURMERIC PO, Take 1 capsule by mouth in the morning., Disp: , Rfl:    valACYclovir (VALTREX) 1000 MG tablet, Take 500 mg by mouth in the morning., Disp: , Rfl:    Objective There were no vitals  filed for this visit.   Gen: NAD CV: S1 S2 RRR Lungs: Clear to auscultation bilaterally Abd: soft, nontender   Previous Pelvic Exam showed: POP-Q   -1                                            Aa   -1                                           Ba   -3  C    4.5                                            Gh   3                                            Pb   5.5                                            tvl    0                                            Ap   0                                            Bp                                                  D           Assessment/ Plan  The patient is a 67 y.o. year old with stage II prolapse scheduled to undergo Exam under anesthesia, anterior and posterior repair with perineorrhaphy and sacrospinous ligament fixation, cystoscopy.    Jaquita Folds, MD

## 2023-01-26 ENCOUNTER — Telehealth: Payer: Self-pay | Admitting: Obstetrics and Gynecology

## 2023-01-26 NOTE — Telephone Encounter (Signed)
Received blood work from PCP that was done last week. A1c was 9.1%. I called patient to discuss that we will need to cancel procedure due to elevated blood sugars. She will follow up with PCP regarding these results but does report that she had a recent steroid injection which has affected her blood sugars. Would prefer A1c to be <8% prior to surgery. She will receive a call from the office to reschedule surgery for future date. She expressed understanding and is in agreement with the plan.   Jaquita Folds, MD

## 2023-01-30 DIAGNOSIS — E113293 Type 2 diabetes mellitus with mild nonproliferative diabetic retinopathy without macular edema, bilateral: Secondary | ICD-10-CM | POA: Diagnosis not present

## 2023-01-30 DIAGNOSIS — M150 Primary generalized (osteo)arthritis: Secondary | ICD-10-CM | POA: Diagnosis not present

## 2023-01-30 DIAGNOSIS — I10 Essential (primary) hypertension: Secondary | ICD-10-CM | POA: Diagnosis not present

## 2023-01-30 DIAGNOSIS — I700 Atherosclerosis of aorta: Secondary | ICD-10-CM | POA: Diagnosis not present

## 2023-01-30 DIAGNOSIS — E782 Mixed hyperlipidemia: Secondary | ICD-10-CM | POA: Diagnosis not present

## 2023-01-30 DIAGNOSIS — Z794 Long term (current) use of insulin: Secondary | ICD-10-CM | POA: Diagnosis not present

## 2023-01-30 DIAGNOSIS — Z5181 Encounter for therapeutic drug level monitoring: Secondary | ICD-10-CM | POA: Diagnosis not present

## 2023-01-31 ENCOUNTER — Encounter: Payer: Self-pay | Admitting: Podiatry

## 2023-01-31 ENCOUNTER — Ambulatory Visit (INDEPENDENT_AMBULATORY_CARE_PROVIDER_SITE_OTHER): Payer: Medicare Other | Admitting: Podiatry

## 2023-01-31 DIAGNOSIS — L84 Corns and callosities: Secondary | ICD-10-CM | POA: Diagnosis not present

## 2023-01-31 DIAGNOSIS — B351 Tinea unguium: Secondary | ICD-10-CM

## 2023-01-31 DIAGNOSIS — E1142 Type 2 diabetes mellitus with diabetic polyneuropathy: Secondary | ICD-10-CM | POA: Diagnosis not present

## 2023-01-31 DIAGNOSIS — M79674 Pain in right toe(s): Secondary | ICD-10-CM | POA: Diagnosis not present

## 2023-01-31 DIAGNOSIS — M79675 Pain in left toe(s): Secondary | ICD-10-CM | POA: Diagnosis not present

## 2023-01-31 DIAGNOSIS — M47816 Spondylosis without myelopathy or radiculopathy, lumbar region: Secondary | ICD-10-CM | POA: Diagnosis not present

## 2023-01-31 NOTE — Progress Notes (Signed)
  Subjective:  Patient ID: Bonnie [name], female    DOB: 12-Dec-1955,   MRN: [id number]  Chief Complaint  Patient presents with   Nail Problem    RFC bilateral nail trim   Callouses    Bilateral callus    67 y.o. female concern of thickened elongated and painful nails that are difficult to trim. Also noted bilateral calluses.  Requesting to have them trimmed today. Relates burning and tingling in their feet. Patient is diabetic and last A1c was  Lab Results  Component Value Date   HGBA1C 7.1 (H) 04/20/2014   .   PCP:  [name], MD   .  Denies any other pedal complaints. Denies n/v/f/c.   Past Medical History:  Diagnosis Date   Anemia    Angina    Anxiety    Carpal tunnel syndrome 12/07/2015   Bilateral   Cyst of brain 2005   Diabetes mellitus    GERD (gastroesophageal reflux disease)    Hypercholesteremia    Hypertension    Obesity    Wears dentures    full set    Objective:  Physical Exam: Vascular: DP/PT pulses 2/4 bilateral. CFT <3 seconds. Absent hair growth on digits. Edema noted to bilateral lower extremities. Xerosis noted bilaterally.  Skin. No lacerations or abrasions bilateral feet. Nails 2-5 bilateral  are thickened discolored  with subungual debris. Hallux nails previously removed. Hyperkeratotic tissue noted to bilateral plantar first fourth and fifth metatrsal heads and bilateral plantar second digits.  Musculoskeletal: MMT 5/5 bilateral lower extremities in DF, PF, Inversion and Eversion. Deceased ROM in DF of ankle joint. No tenderness to palpation.  Neurological: Sensation intact to light touch. Protective sensation diminished bilateral.    Assessment:   1. Pain due to onychomycosis of toenails of both feet   2. Callus   3. Diabetic peripheral neuropathy associated with type 2 diabetes mellitus ([place])      Plan:  Patient was evaluated and treated and all questions answered. -Discussed and educated patient on diabetic foot care, especially  with  regards to the vascular, neurological and musculoskeletal systems.  -Stressed the importance of good glycemic control and the detriment of not  controlling glucose levels in relation to the foot. -Discussed supportive shoes at all times and checking feet regularly.  -Mechanically debrided all nails 1-5 bilateral using sterile nail nipper and filed with dremel without incident  -Hyperkeratotic tissue debrided without incident with chisel  -Answered all patient questions -Patient to return  in 3 months for at risk foot care -Patient advised to call the office if any problems or questions arise in the meantime.    Lorenda Peck, DPM

## 2023-02-01 DIAGNOSIS — H18423 Band keratopathy, bilateral: Secondary | ICD-10-CM | POA: Insufficient documentation

## 2023-02-01 DIAGNOSIS — H0288A Meibomian gland dysfunction right eye, upper and lower eyelids: Secondary | ICD-10-CM | POA: Diagnosis not present

## 2023-02-01 DIAGNOSIS — H401131 Primary open-angle glaucoma, bilateral, mild stage: Secondary | ICD-10-CM | POA: Diagnosis not present

## 2023-02-01 DIAGNOSIS — H0288B Meibomian gland dysfunction left eye, upper and lower eyelids: Secondary | ICD-10-CM | POA: Diagnosis not present

## 2023-02-01 DIAGNOSIS — Z961 Presence of intraocular lens: Secondary | ICD-10-CM | POA: Diagnosis not present

## 2023-02-01 DIAGNOSIS — Z794 Long term (current) use of insulin: Secondary | ICD-10-CM | POA: Diagnosis not present

## 2023-02-01 DIAGNOSIS — E113212 Type 2 diabetes mellitus with mild nonproliferative diabetic retinopathy with macular edema, left eye: Secondary | ICD-10-CM | POA: Diagnosis not present

## 2023-02-01 DIAGNOSIS — E113291 Type 2 diabetes mellitus with mild nonproliferative diabetic retinopathy without macular edema, right eye: Secondary | ICD-10-CM | POA: Diagnosis not present

## 2023-02-02 DIAGNOSIS — I10 Essential (primary) hypertension: Secondary | ICD-10-CM | POA: Diagnosis not present

## 2023-02-02 DIAGNOSIS — E1165 Type 2 diabetes mellitus with hyperglycemia: Secondary | ICD-10-CM | POA: Diagnosis not present

## 2023-02-13 ENCOUNTER — Encounter (HOSPITAL_BASED_OUTPATIENT_CLINIC_OR_DEPARTMENT_OTHER): Payer: Self-pay

## 2023-02-13 ENCOUNTER — Ambulatory Visit (HOSPITAL_BASED_OUTPATIENT_CLINIC_OR_DEPARTMENT_OTHER): Admit: 2023-02-13 | Payer: Medicare Other | Admitting: Obstetrics and Gynecology

## 2023-02-13 SURGERY — ANTERIOR AND POSTERIOR REPAIR WITH SACROSPINOUS FIXATION
Anesthesia: General

## 2023-03-17 ENCOUNTER — Encounter: Payer: Medicare Other | Admitting: Obstetrics and Gynecology

## 2023-03-25 ENCOUNTER — Other Ambulatory Visit: Payer: Self-pay

## 2023-03-25 ENCOUNTER — Emergency Department (HOSPITAL_BASED_OUTPATIENT_CLINIC_OR_DEPARTMENT_OTHER)
Admission: EM | Admit: 2023-03-25 | Discharge: 2023-03-26 | Disposition: A | Discharge status: Home / Self Care | Payer: Medicare Other | Attending: Emergency Medicine | Admitting: Emergency Medicine

## 2023-03-25 ENCOUNTER — Encounter (HOSPITAL_BASED_OUTPATIENT_CLINIC_OR_DEPARTMENT_OTHER): Payer: Self-pay

## 2023-03-25 ENCOUNTER — Emergency Department (HOSPITAL_BASED_OUTPATIENT_CLINIC_OR_DEPARTMENT_OTHER): Payer: Medicare Other | Admitting: Radiology

## 2023-03-25 DIAGNOSIS — S161XXA Strain of muscle, fascia and tendon at neck level, initial encounter: Secondary | ICD-10-CM

## 2023-03-25 DIAGNOSIS — E119 Type 2 diabetes mellitus without complications: Secondary | ICD-10-CM | POA: Insufficient documentation

## 2023-03-25 DIAGNOSIS — Y92481 Parking lot as the place of occurrence of the external cause: Secondary | ICD-10-CM | POA: Insufficient documentation

## 2023-03-25 DIAGNOSIS — M7918 Myalgia, other site: Secondary | ICD-10-CM | POA: Insufficient documentation

## 2023-03-25 DIAGNOSIS — M542 Cervicalgia: Secondary | ICD-10-CM | POA: Insufficient documentation

## 2023-03-25 DIAGNOSIS — S29019A Strain of muscle and tendon of unspecified wall of thorax, initial encounter: Secondary | ICD-10-CM

## 2023-03-25 DIAGNOSIS — S39012A Strain of muscle, fascia and tendon of lower back, initial encounter: Secondary | ICD-10-CM

## 2023-03-25 DIAGNOSIS — S29012A Strain of muscle and tendon of back wall of thorax, initial encounter: Secondary | ICD-10-CM | POA: Diagnosis not present
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
## 2023-03-26 DIAGNOSIS — M545 Low back pain, unspecified: Secondary | ICD-10-CM | POA: Diagnosis not present

## 2023-03-26 DIAGNOSIS — M546 Pain in thoracic spine: Secondary | ICD-10-CM | POA: Diagnosis not present

## 2023-03-26 DIAGNOSIS — M542 Cervicalgia: Secondary | ICD-10-CM | POA: Diagnosis not present

## 2023-03-26 MED ORDER — CYCLOBENZAPRINE HCL 10 MG PO TABS: 10.0000 mg | ORAL_TABLET | Freq: Three times a day (TID) | ORAL | 0 refills | Status: DC | PRN

## 2023-03-26 NOTE — Discharge Instructions (Signed)
Take ibuprofen 600 mg every 6 hours as needed for pain.  Begin taking Flexeril as prescribed as needed for pain not relieved with ibuprofen.

## 2023-03-26 NOTE — ED Notes (Signed)
RN reviewed discharge instructions with pt. Pt verbalized understanding and had no further questions. VSS upon discharge.  

## 2023-04-05 DIAGNOSIS — H401131 Primary open-angle glaucoma, bilateral, mild stage: Secondary | ICD-10-CM | POA: Diagnosis not present

## 2023-04-11 ENCOUNTER — Telehealth: Payer: Self-pay | Admitting: Obstetrics and Gynecology

## 2023-04-11 NOTE — Telephone Encounter (Signed)
Patient called in and said she has decided to wait to schedule surgery until after January.  I told her that she would need to be re-evaluated and she agreed to an office visit with Dr. Florian Buff.  Recall for office visit put in the system.

## 2023-04-15 ENCOUNTER — Ambulatory Visit (INDEPENDENT_AMBULATORY_CARE_PROVIDER_SITE_OTHER): Payer: Medicare Other

## 2023-04-15 ENCOUNTER — Encounter (HOSPITAL_COMMUNITY): Payer: Self-pay

## 2023-04-15 ENCOUNTER — Ambulatory Visit (HOSPITAL_COMMUNITY)
Admission: EM | Admit: 2023-04-15 | Discharge: 2023-04-15 | Disposition: A | Discharge status: Home / Self Care | Payer: Medicare Other | Attending: Physician Assistant | Admitting: Physician Assistant

## 2023-04-15 DIAGNOSIS — R0602 Shortness of breath: Secondary | ICD-10-CM | POA: Diagnosis not present

## 2023-04-15 DIAGNOSIS — R002 Palpitations: Secondary | ICD-10-CM | POA: Diagnosis not present

## 2023-04-15 DIAGNOSIS — R0789 Other chest pain: Secondary | ICD-10-CM | POA: Insufficient documentation

## 2023-04-15 LAB — CBC WITH DIFFERENTIAL/PLATELET
Abs Immature Granulocytes: 0.01 10*3/uL (ref 0.00–0.07)
Basophils Absolute: 0.1 10*3/uL (ref 0.0–0.1)
Basophils Relative: 1 %
Eosinophils Absolute: 0.2 10*3/uL (ref 0.0–0.5)
Eosinophils Relative: 3 %
HCT: 39.1 % (ref 36.0–46.0)
Hemoglobin: 12.6 g/dL (ref 12.0–15.0)
Immature Granulocytes: 0 %
Lymphocytes Relative: 40 %
Lymphs Abs: 2.8 10*3/uL (ref 0.7–4.0)
MCH: 25 pg — ABNORMAL LOW (ref 26.0–34.0)
MCHC: 32.2 g/dL (ref 30.0–36.0)
MCV: 77.4 fL — ABNORMAL LOW (ref 80.0–100.0)
Monocytes Absolute: 0.7 10*3/uL (ref 0.1–1.0)
Monocytes Relative: 10 %
Neutro Abs: 3.3 10*3/uL (ref 1.7–7.7)
Neutrophils Relative %: 46 %
Platelets: 168 10*3/uL (ref 150–400)
RBC: 5.05 MIL/uL (ref 3.87–5.11)
RDW: 15.8 % — ABNORMAL HIGH (ref 11.5–15.5)
WBC: 7.1 10*3/uL (ref 4.0–10.5)
nRBC: 0 % (ref 0.0–0.2)

## 2023-04-15 LAB — COMPREHENSIVE METABOLIC PANEL
ALT: 22 U/L (ref 0–44)
AST: 34 U/L (ref 15–41)
Albumin: 3.8 g/dL (ref 3.5–5.0)
Alkaline Phosphatase: 86 U/L (ref 38–126)
Anion gap: 12 (ref 5–15)
BUN: 15 mg/dL (ref 8–23)
CO2: 24 mmol/L (ref 22–32)
Calcium: 10 mg/dL (ref 8.9–10.3)
Chloride: 105 mmol/L (ref 98–111)
Creatinine, Ser: 0.76 mg/dL (ref 0.44–1.00)
GFR, Estimated: >60 mL/min (ref >60)
Glucose, Bld: 108 mg/dL — ABNORMAL HIGH (ref 70–99)
Potassium: 5.2 mmol/L — ABNORMAL HIGH (ref 3.5–5.1)
Sodium: 141 mmol/L (ref 135–145)
Total Bilirubin: 0.7 mg/dL (ref 0.3–1.2)
Total Protein: 7.8 g/dL (ref 6.5–8.1)

## 2023-04-15 LAB — TSH: TSH: 0.961 u[IU]/mL (ref 0.350–4.500)

## 2023-04-15 NOTE — Discharge Instructions (Signed)
Your chest x-ray was normal.  We have drawn some basic blood work to investigate possible causes of your symptoms and we will contact you if any of this is abnormal within the next few days.  Monitor your MyChart for the results.  Make sure you are drinking plenty of fluid and avoid caffeine.  Follow-up with cardiology first thing next week; call to schedule an appointment.  As we discussed, if you have any recurrent symptoms including chest pain, shortness of breath, heart racing symptoms I recommend you go immediately to the emergency room for further evaluation and management.

## 2023-04-15 NOTE — ED Triage Notes (Signed)
Patient here today with concerns of chest pains off and on for the past 3 days. When she is lying down at night she has some SOB. She gets out of breath with she has to get up and move around. Feels like her heart flutters.

## 2023-04-15 NOTE — ED Provider Notes (Signed)
[facility]    CSN: [id number] Arrival date & time: 04/15/23  1314      History   Chief Complaint Chief Complaint  Patient presents with   Chest Pain    HPI Bonnie [name] is a 67 y.o. female.   Patient presents today companied by her husband.  Reports for the past 3 days that she has had intermittent chest discomfort.  She reports a few episodes per day that last for few minutes and then resolve without intervention.  She has noticed the episodes more often at night when she gets up to go to the bathroom.  She will have associated shortness of breath but denies any associated nausea, vomiting, diaphoresis.  She denies personal history of cardiovascular disease but does have multiple risk factors including hypertension, hyperlipidemia, diabetes.  She is a former smoker but quit many years ago.  Does report that her mother had a stroke at age 107 but denies additional family history of CVD.  She has not seen a cardiologist.  She has not tried any over-the-counter medication for symptom management.  Denies any increase in caffeine consumption; reports that she does not drink caffeine.  Denies any medication changes.  She is currently asymptomatic.  During episodes pain is rated 6 on a 0-10 pain scale, described as sharp that travels from her left chest towards her left arm lasting for few seconds and then resolving without intervention, no aggravating relieving factors identified.    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   Diabetic renal disease (HCC) 12/11/2021   Adult general medical exam 12/11/2021   Hardening of the  aorta (main artery of the heart) (HCC) 12/11/2021   Hordeolum externum of right upper eyelid 08/20/2020   Glaucoma 06/23/2020   History of spinal fusion 11/27/2019   Chalazion of left upper eyelid 06/27/2019   Primary open angle glaucoma of both eyes, mild stage 06/27/2019   Hypertensive disorder 04/23/2019   Cervical radiculopathy 04/24/2018   Cystoid macular edema following cataract surgery, right eye 05/04/2017   Cervical pain 03/07/2016   Sickle cell trait (HCC) 03/07/2016   Carpal tunnel syndrome 12/07/2015   Pain in female genitalia on intercourse 09/17/2014   Nausea with vomiting 04/22/2014   Diarrhea 04/22/2014   Essential hypertension, benign 04/20/2014   Ileitis 04/19/2014   Cataract 12/25/2013   Dry eye syndrome 12/25/2013   Chronic LBP 02/27/2013   Hernia, rectovaginal 02/12/2013   Dysfunctional voiding of urine 02/12/2013   Bladder cystocele 12/06/2012   Diabetes mellitus (HCC) 12/06/2012   HLD (hyperlipidemia) 12/06/2012   Benign neoplasm of meninges (HCC) 12/06/2012   Mixed incontinence 12/06/2012   Open angle with borderline findings, high risk 10/17/2011   Chest pain, atypical 10/01/2011   Shortness of breath 10/01/2011   DM type 2 (diabetes mellitus, type 2) (HCC) 10/01/2011   Type 2 diabetes mellitus with both eyes affected by mild nonproliferative retinopathy without macular edema, with Poitra-term current use of insulin (HCC) 10/01/2011   Hypercholesteremia    Anemia  Obesity    Anxiety    GERD (gastroesophageal reflux disease)     Past Surgical History:  Procedure Laterality Date   BACK SURGERY     2001 for DJD   BLADDER SURGERY     BRAIN SURGERY     BRAIN SURGERY     For meningioma.   COCCYX REMOVAL     COLONOSCOPY Left 04/22/2014   Procedure: COLONOSCOPY;  Surgeon: Theda Belfast, MD;  Location: [facility] ENDOSCOPY;  Service: Endoscopy;  Laterality: Left;   ESOPHAGOGASTRODUODENOSCOPY (EGD) WITH PROPOFOL N/A 02/12/2021   Procedure:  ESOPHAGOGASTRODUODENOSCOPY (EGD) WITH PROPOFOL;  Surgeon: Jeani Hawking, MD;  Location: [facility] ENDOSCOPY;  Service: Endoscopy;  Laterality: N/A;   SAVORY DILATION N/A 02/12/2021   Procedure: SAVORY DILATION;  Surgeon: Jeani Hawking, MD;  Location: [facility] ENDOSCOPY;  Service: Endoscopy;  Laterality: N/A;   SHOULDER SURGERY     TONSILLECTOMY     VAGINAL HYSTERECTOMY      OB History     Gravida  3   Para  3   Term  3   Preterm      AB      Living  2      SAB      IAB      Ectopic      Multiple      Live Births  3            Home Medications    Prior to Admission medications   Medication Sig Start Date End Date Taking? Authorizing Provider  acetaminophen (TYLENOL) 500 MG tablet Take 1 tablet (500 mg total) by mouth every 6 (six) hours as needed (pain). 01/20/23  Yes Marguerita Beards, MD  ascorbic acid (VITAMIN C) 1000 MG tablet Take 1 tablet by mouth daily.   Yes [provider]  Cholecalciferol 125 MCG (5000 UT) TABS Take by mouth.   Yes [provider]  clonazePAM (KLONOPIN) 1 MG tablet Take 1 mg by mouth at bedtime. 01/05/22  Yes [provider]  conjugated estrogens (PREMARIN) vaginal cream See admin instructions. 05/07/21  Yes [provider]  dapagliflozin propanediol (FARXIGA) 5 MG TABS tablet Take 5 mg by mouth in the morning.   Yes [provider]  dorzolamide-timolol (COSOPT) 22.3-6.8 MG/ML ophthalmic solution Place 1 drop into both eyes 2 (two) times daily. 12/09/21  Yes [provider]  insulin glargine (LANTUS) 100 UNIT/ML injection Inject 11 Units into the skin at bedtime.   Yes [provider]  Lancets (ONETOUCH ULTRASOFT) lancets OneTouch UltraSoft Lancets   Yes [provider]  liraglutide (VICTOZA) 18 MG/3ML SOPN Inject 1.8 mg into the skin daily.   Yes [provider]  metFORMIN (GLUCOPHAGE) 1000 MG tablet Take 1,000 mg by mouth 2 (two) times daily with a meal.   Yes [provider]  rosuvastatin (CRESTOR) 5 MG tablet Take 5 mg by mouth daily.   Yes [provider]  TURMERIC PO Take 1 capsule by mouth in the morning.   Yes [provider]  Blood Glucose Monitoring Suppl (FIFTY50 GLUCOSE METER 2.0) w/Device KIT OneTouch UltraMini kit  USE AS DIRECTED TO CHECK BLOOD SUGARS TWICE A DAY    [provider]  carboxymethylcellulose (REFRESH PLUS) 0.5 % SOLN Place 1 drop into both eyes 3 (three) times daily as needed.    [provider]  Insulin Pen Needle 31G X 5 MM MISC USE TWICE A DAY 03/28/18   [provider]  nitrofurantoin, macrocrystal-monohydrate, (MACROBID)  100 MG capsule Take 1 capsule (100 mg total) by mouth 2 (two) times daily. 01/14/23   Jerene Bears, MD  [facility] VERIO test strip 2 (two) times daily. use for testing 04/03/19   [provider]    Family History Family History  Problem Relation Age of Onset   Diabetes Mother    Lung cancer Father    Diabetes Brother     Social History Social History   Tobacco Use   Smoking status: Former    Packs/day: 0.50    Years: 26.00    Additional pack years: 0.00    Total pack years: 13.00    Types: Cigarettes    Quit date: 1998    Years since quitting: 26.4   Smokeless tobacco: Never  Vaping Use   Vaping Use: Never used  Substance Use Topics   Alcohol use: Yes    Comment: occasionally   Drug use: No     Allergies   Clarithromycin, Exenatide, Ibuprofen, Methocarbamol, Shellfish allergy, Ampicillin, Carafate [sucralfate], Cephalexin, Cyanocobalamin, Glyburide, Metronidazole, Prednisone, Pregabalin, Vitamin b12, Ketoconazole, Mango flavor, Pineapple, and Strawberry flavor   Review of Systems Review of Systems  Constitutional:  Positive for activity change. Negative for appetite change, fatigue and fever.  Respiratory:  Positive for shortness of breath (During episodes). Negative for cough.   Cardiovascular:  Positive for chest pain  (Intermittent) and palpitations. Negative for leg swelling.  Gastrointestinal:  Negative for abdominal pain, diarrhea, nausea and vomiting.  Neurological:  Negative for dizziness, syncope, light-headedness and headaches.     Physical Exam Triage Vital Signs ED Triage Vitals  Enc Vitals Group     BP 04/15/23 1533 (!) 156/82     Pulse Rate 04/15/23 1533 66     Resp 04/15/23 1533 17     Temp 04/15/23 1533 97.9 F (36.6 C)     Temp Source 04/15/23 1533 Oral     SpO2 04/15/23 1533 97 %     Weight 04/15/23 1533 193 lb (87.5 kg)     Height 04/15/23 1533 5\' 8"  (1.727 m)     Head Circumference --      Peak Flow --      Pain Score 04/15/23 1532 6     Pain Loc --      Pain Edu? --      Excl. in GC? --    No data found.  Updated Vital Signs BP (!) 156/82 (BP Location: Left Arm)   Pulse 66   Temp 97.9 F (36.6 C) (Oral)   Resp 17   Ht 5\' 8"  (1.727 m)   Wt 193 lb (87.5 kg)   SpO2 97%   BMI 29.35 kg/m   Visual Acuity Right Eye Distance:   Left Eye Distance:   Bilateral Distance:    Right Eye Near:   Left Eye Near:    Bilateral Near:     Physical Exam Vitals reviewed.  Constitutional:      General: She is awake. She is not in acute distress.    Appearance: Normal appearance. She is well-developed. She is not ill-appearing.     Comments: Very pleasant female appears stated age in no acute distress sitting comfortably in exam room  HENT:     Head: Normocephalic and atraumatic.  Cardiovascular:     Rate and Rhythm: Normal rate and regular rhythm.     Heart sounds: Normal heart sounds, S1 normal and S2 normal. No murmur heard. Pulmonary:     Effort: Pulmonary effort  is normal.     Breath sounds: Normal breath sounds. No wheezing, rhonchi or rales.     Comments: Clear to auscultation bilaterally Chest:     Chest wall: Tenderness present. No deformity or swelling.     Comments: Patient does have tenderness palpation across anterior chest wall but this is not identical to  the pain she is experiencing intermittently Abdominal:     General: Bowel sounds are normal.     Palpations: Abdomen is soft.     Tenderness: There is no abdominal tenderness. There is no right CVA tenderness, left CVA tenderness, guarding or rebound.  Musculoskeletal:     Right lower leg: No edema.     Left lower leg: No edema.  Psychiatric:        Behavior: Behavior is cooperative.      UC Treatments / Results  Labs (all labs ordered are listed, but only abnormal results are displayed) Labs Reviewed  CBC WITH DIFFERENTIAL/PLATELET  COMPREHENSIVE METABOLIC PANEL  TSH    EKG   Radiology DG Chest 2 View  Result Date: 04/15/2023 CLINICAL DATA:  Chest discomfort.  Shortness of breath. EXAM: CHEST - 2 VIEW COMPARISON:  None Available. FINDINGS: The heart size and mediastinal contours are within normal limits. Both lungs are clear. The visualized skeletal structures are unremarkable. IMPRESSION: No active cardiopulmonary disease. Electronically Signed   By: Gerome Sam III M.D.   On: 04/15/2023 16:33    Procedures Procedures (including critical care time)  Medications Ordered in UC Medications - No data to display  Initial Impression / Assessment and Plan / UC Course  I have reviewed the triage vital signs and the nursing notes.  Pertinent labs & imaging results that were available during my care of the patient were reviewed by me and considered in my medical decision making (see chart for details).     Patient is well-appearing, afebrile, nontoxic, nontachycardic.  EKG was obtained that showed normal sinus rhythm with ventricular rate of 70 bpm without ischemic changes; compared to 09/09/2022 tracing nonspecific ST changes in V4 and V5.  We did discuss that given her past medical history it would be reasonable to go to the emergency room however she is not experiencing any pain at this time.  Through shared decision making we decided to initiate workup in urgent care and so  x-ray was obtained that showed no acute cardiopulmonary disease.  Patient did not have any recurrent chest pain during her visit including anginal symptoms with ambulation around clinic.  Given she is currently asymptomatic we will try to arrange outpatient evaluation and she was given the contact information for cardiologist with instruction call to schedule an appointment for Singh next week.  Basic labs including CBC, CMP, TSH were obtained and are pending.  We will contact her if any of this is abnormal.  She is to drink plenty fluids and avoid caffeine.  We discussed that if she has any recurrent symptoms including chest discomfort, shortness of breath, palpitations, weakness, syncope she needs to go to the emergency room immediately to which she and her husband expressed understanding.  Strict return precautions given.  Final Clinical Impressions(s) / UC Diagnoses   Final diagnoses:  Atypical chest pain  Shortness of breath  Palpitations     Discharge Instructions      Your chest x-ray was normal.  We have drawn some basic blood work to investigate possible causes of your symptoms and we will contact you if any of this is  abnormal within the next few days.  Monitor your MyChart for the results.  Make sure you are drinking plenty of fluid and avoid caffeine.  Follow-up with cardiology first thing next week; call to schedule an appointment.  As we discussed, if you have any recurrent symptoms including chest pain, shortness of breath, heart racing symptoms I recommend you go immediately to the emergency room for further evaluation and management.     ED Prescriptions   None    PDMP not reviewed this encounter.   Jeani Hawking, PA-C 04/15/23 1644

## 2023-04-19 ENCOUNTER — Telehealth (HOSPITAL_COMMUNITY): Payer: Self-pay | Admitting: Emergency Medicine

## 2023-04-19 DIAGNOSIS — L237 Allergic contact dermatitis due to plants, except food: Secondary | ICD-10-CM | POA: Diagnosis not present

## 2023-04-19 NOTE — Telephone Encounter (Signed)
Results reviewed with patient, all questions answered

## 2023-05-02 ENCOUNTER — Ambulatory Visit: Payer: Medicare Other | Admitting: Podiatry

## 2023-05-15 ENCOUNTER — Ambulatory Visit: Payer: Medicare Other | Admitting: Internal Medicine

## 2023-05-17 ENCOUNTER — Encounter: Payer: Self-pay | Admitting: *Deleted

## 2023-05-17 ENCOUNTER — Ambulatory Visit: Payer: Medicare Other | Admitting: Internal Medicine

## 2023-05-19 NOTE — Progress Notes (Signed)
Patient was scheduled for an appointment with Dr. Wyline Mood on 05/17/23, but Dr. Wyline Mood was not scheduled in the office at the time. (template issue). Spoke with patient and husband and apologized for the the scheduling error. Offered next available appointment with Dr. Wyline Mood on 05/24/23, but pt declined as she had another appointment scheduled that day. Offered to have scheduler contact her later in the day when she could access her calendar. Pt and husband verbalized agreement with this plan and proceeded to leave the office.  Per appointment cancellation notes from scheduler on 05/17/23, pt stated she will call back when she is ready to reschedule.

## 2023-05-24 ENCOUNTER — Encounter: Payer: Self-pay | Admitting: Podiatry

## 2023-05-24 ENCOUNTER — Ambulatory Visit: Payer: Medicare Other | Admitting: Podiatry

## 2023-05-24 DIAGNOSIS — Z961 Presence of intraocular lens: Secondary | ICD-10-CM | POA: Diagnosis not present

## 2023-05-24 DIAGNOSIS — B351 Tinea unguium: Secondary | ICD-10-CM

## 2023-05-24 DIAGNOSIS — E113212 Type 2 diabetes mellitus with mild nonproliferative diabetic retinopathy with macular edema, left eye: Secondary | ICD-10-CM | POA: Diagnosis not present

## 2023-05-24 DIAGNOSIS — E1142 Type 2 diabetes mellitus with diabetic polyneuropathy: Secondary | ICD-10-CM

## 2023-05-24 DIAGNOSIS — M79674 Pain in right toe(s): Secondary | ICD-10-CM

## 2023-05-24 DIAGNOSIS — L84 Corns and callosities: Secondary | ICD-10-CM | POA: Diagnosis not present

## 2023-05-24 DIAGNOSIS — H18423 Band keratopathy, bilateral: Secondary | ICD-10-CM | POA: Diagnosis not present

## 2023-05-24 DIAGNOSIS — M79675 Pain in left toe(s): Secondary | ICD-10-CM

## 2023-05-24 DIAGNOSIS — Z794 Long term (current) use of insulin: Secondary | ICD-10-CM | POA: Diagnosis not present

## 2023-05-24 DIAGNOSIS — H401131 Primary open-angle glaucoma, bilateral, mild stage: Secondary | ICD-10-CM | POA: Diagnosis not present

## 2023-05-24 DIAGNOSIS — H0288A Meibomian gland dysfunction right eye, upper and lower eyelids: Secondary | ICD-10-CM | POA: Diagnosis not present

## 2023-05-24 DIAGNOSIS — H0288B Meibomian gland dysfunction left eye, upper and lower eyelids: Secondary | ICD-10-CM | POA: Diagnosis not present

## 2023-05-24 NOTE — Progress Notes (Signed)
  Subjective:  Patient ID: Bonnie [name], female    DOB: [birth year],   MRN: [id number]  Chief Complaint  Patient presents with   Debridement    Trim toenails/calluses    67 y.o. female concern of thickened elongated and painful nails that are difficult to trim. Also noted bilateral calluses.  Requesting to have them trimmed today. Relates burning and tingling in their feet. Patient is diabetic and last A1c was  Lab Results  Component Value Date   HGBA1C 7.1 (H) 04/20/2014   .   PCP:  Georgianne Fick, MD   .  Denies any other pedal complaints. Denies n/v/f/c.   Past Medical History:  Diagnosis Date   Anemia    Angina    Anxiety    Carpal tunnel syndrome 12/07/2015   Bilateral   Cyst of brain 2005   Diabetes mellitus    GERD (gastroesophageal reflux disease)    Hypercholesteremia    Hypertension    Obesity    Wears dentures    full set    Objective:  Physical Exam: Vascular: DP/PT pulses 2/4 bilateral. CFT <3 seconds. Absent hair growth on digits. Edema noted to bilateral lower extremities. Xerosis noted bilaterally.  Skin. No lacerations or abrasions bilateral feet. Nails 2-5 bilateral  are thickened discolored  with subungual debris. Hallux nails previously removed. Hyperkeratotic tissue noted to bilateral plantar first fourth and fifth metatrsal heads and bilateral plantar second digits.  Musculoskeletal: MMT 5/5 bilateral lower extremities in DF, PF, Inversion and Eversion. Deceased ROM in DF of ankle joint. No tenderness to palpation.  Neurological: Sensation intact to light touch. Protective sensation diminished bilateral.    Assessment:   1. Pain due to onychomycosis of toenails of both feet   2. Callus   3. Diabetic peripheral neuropathy associated with type 2 diabetes mellitus (HCC)       Plan:  Patient was evaluated and treated and all questions answered. -Discussed and educated patient on diabetic foot care, especially with  regards to the vascular,  neurological and musculoskeletal systems.  -Stressed the importance of good glycemic control and the detriment of not  controlling glucose levels in relation to the foot. -Discussed supportive shoes at all times and checking feet regularly.  -Mechanically debrided all nails 1-5 bilateral using sterile nail nipper and filed with dremel without incident  -Hyperkeratotic tissue debrided without incident with chisel  -Answered all patient questions -Patient to return  in 3 months for at risk foot care -Patient advised to call the office if any problems or questions arise in the meantime.    Louann Sjogren, DPM

## 2023-05-30 ENCOUNTER — Encounter (HOSPITAL_COMMUNITY): Payer: Self-pay

## 2023-05-30 ENCOUNTER — Ambulatory Visit (HOSPITAL_COMMUNITY)
Admission: EM | Admit: 2023-05-30 | Discharge: 2023-05-30 | Disposition: A | Discharge status: Home / Self Care | Payer: Medicare Other

## 2023-05-30 DIAGNOSIS — J339 Nasal polyp, unspecified: Secondary | ICD-10-CM | POA: Diagnosis not present

## 2023-05-30 NOTE — ED Provider Notes (Signed)
[facility]    CSN: [id number] Arrival date & time: 05/30/23  1533      History   Chief Complaint Chief Complaint  Patient presents with   Nasal Congestion    HPI Bonnie [name] is a 67 y.o. female.   HPI  She is in today for evaluation of a bump to her right nare.  She reports that she was started on mupirocin yesterday.  She reports that after using the medication she had a lot of drainage from her nose.  She has a history of, and is currently on several eyedrops she reports that her ophthalmologist recommended that she stop 1 drop and restart the timolol..  She has a history of multiple allergies to medications.  She denies any fever, chills,  headache, dizziness, or difficulty breathing.  She endorses that 1 year ago she had similar symptoms on her left nare.  She has tried fluticasone x 1. 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   Diabetic renal disease (HCC) 12/11/2021   Adult general medical exam 12/11/2021   Hardening of the aorta (main artery of the heart) (HCC) 12/11/2021   Hordeolum externum of right upper eyelid 08/20/2020   Glaucoma 06/23/2020   History of spinal fusion 11/27/2019   Chalazion of left upper eyelid 06/27/2019   Primary open angle glaucoma of both eyes, mild stage 06/27/2019   Hypertensive disorder 04/23/2019   Cervical radiculopathy 04/24/2018   Cystoid macular edema following cataract surgery, right eye 05/04/2017   Cervical pain 03/07/2016   Sickle cell trait (HCC) 03/07/2016   Carpal tunnel syndrome 12/07/2015   Pain in female genitalia on intercourse 09/17/2014   Nausea with  vomiting 04/22/2014   Diarrhea 04/22/2014   Essential hypertension, benign 04/20/2014   Ileitis 04/19/2014   Cataract 12/25/2013   Dry eye syndrome 12/25/2013   Chronic LBP 02/27/2013   Hernia, rectovaginal 02/12/2013   Dysfunctional voiding of urine 02/12/2013   Bladder cystocele 12/06/2012   Diabetes mellitus (HCC) 12/06/2012   HLD (hyperlipidemia) 12/06/2012   Benign neoplasm of meninges (HCC) 12/06/2012   Mixed incontinence 12/06/2012   Open angle with borderline findings, high risk 10/17/2011   Chest pain, atypical 10/01/2011   Shortness of breath 10/01/2011   DM type 2 (diabetes mellitus, type 2) (HCC) 10/01/2011   Type 2 diabetes mellitus with both eyes affected by mild nonproliferative retinopathy without macular edema, with Sautter-term current use of insulin (HCC) 10/01/2011   Hypercholesteremia    Anemia    Obesity    Anxiety    GERD (gastroesophageal reflux disease)     Past Surgical History:  Procedure Laterality Date   BACK SURGERY     2001 for DJD   BLADDER SURGERY     BRAIN SURGERY     BRAIN SURGERY     For meningioma.   COCCYX REMOVAL     COLONOSCOPY Left 04/22/2014   Procedure: COLONOSCOPY;  Surgeon: Theda Belfast, MD;  Location: [facility] ENDOSCOPY;  Service: Endoscopy;  Laterality: Left;   ESOPHAGOGASTRODUODENOSCOPY (EGD) WITH  PROPOFOL N/A 02/12/2021   Procedure: ESOPHAGOGASTRODUODENOSCOPY (EGD) WITH PROPOFOL;  Surgeon: Jeani Hawking, MD;  Location: [facility] ENDOSCOPY;  Service: Endoscopy;  Laterality: N/A;   SAVORY DILATION N/A 02/12/2021   Procedure: SAVORY DILATION;  Surgeon: Jeani Hawking, MD;  Location: [facility] ENDOSCOPY;  Service: Endoscopy;  Laterality: N/A;   SHOULDER SURGERY     TONSILLECTOMY     VAGINAL HYSTERECTOMY      OB History     Gravida  3   Para  3   Term  3   Preterm      AB      Living  2      SAB      IAB      Ectopic      Multiple      Live Births  3            Home Medications    Prior to Admission medications    Medication Sig Start Date End Date Taking? Authorizing Provider  acetaminophen (TYLENOL) 500 MG tablet Take 1 tablet (500 mg total) by mouth every 6 (six) hours as needed (pain). 01/20/23   Marguerita Beards, MD  ascorbic acid (VITAMIN C) 1000 MG tablet Take 1 tablet by mouth daily.    [provider]  Blood Glucose Monitoring Suppl (FIFTY50 GLUCOSE METER 2.0) w/Device KIT OneTouch UltraMini kit  USE AS DIRECTED TO CHECK BLOOD SUGARS TWICE A DAY    [provider]  carboxymethylcellulose (REFRESH PLUS) 0.5 % SOLN Place 1 drop into both eyes 3 (three) times daily as needed.    [provider]  Cholecalciferol 125 MCG (5000 UT) TABS Take by mouth.    [provider]  clonazePAM (KLONOPIN) 1 MG tablet Take 1 mg by mouth at bedtime. 01/05/22   [provider]  conjugated estrogens (PREMARIN) vaginal cream See admin instructions. 05/07/21   [provider]  Continuous Glucose Sensor (DEXCOM G6 SENSOR) MISC  11/28/22   [provider]  dapagliflozin propanediol (FARXIGA) 5 MG TABS tablet Take 5 mg by mouth in the morning.    [provider]  dorzolamide-timolol (COSOPT) 22.3-6.8 MG/ML ophthalmic solution Place 1 drop into both eyes 2 (two) times daily. 12/09/21   [provider]  insulin glargine (LANTUS) 100 UNIT/ML injection Inject 11 Units into the skin at bedtime.    [provider]  Insulin Pen Needle 31G X 5 MM MISC USE TWICE A DAY 03/28/18   [provider]  Lancets (ONETOUCH ULTRASOFT) lancets OneTouch UltraSoft Lancets    [provider]  latanoprost (XALATAN) 0.005 % ophthalmic solution SMARTSIG:In Eye(s)    [provider]  liraglutide (VICTOZA) 18 MG/3ML SOPN Inject 1.8 mg into the skin daily.    [provider]  metFORMIN (GLUCOPHAGE) 1000 MG tablet Take 1,000 mg by mouth 2 (two) times daily with a meal.    [provider]  [facility] VERIO test strip 2 (two)  times daily. use for testing 04/03/19   [provider]  ramipril (ALTACE) 5 MG capsule Take 5 mg by mouth daily. 04/29/23   [provider]  rosuvastatin (CRESTOR) 5 MG tablet Take 5 mg by mouth daily.    [provider]  TURMERIC PO Take 1 capsule by mouth in the morning.    [provider]  valACYclovir (VALTREX) 1000 MG tablet Take 500 mg by mouth daily. 04/29/23   [provider]    Family History Family History  Problem Relation Age of  Onset   Diabetes Mother    Lung cancer Father    Diabetes Brother     Social History Social History   Tobacco Use   Smoking status: Former    Current packs/day: 0.00    Average packs/day: 0.5 packs/day for 26.0 years (13.0 ttl pk-yrs)    Types: Cigarettes    Start date: 24    Quit date: 1998    Years since quitting: 26.5   Smokeless tobacco: Never  Vaping Use   Vaping status: Never Used  Substance Use Topics   Alcohol use: Yes    Comment: occasionally   Drug use: No     Allergies   Clarithromycin, Exenatide, Ibuprofen, Methocarbamol, Shellfish allergy, Ampicillin, Carafate [sucralfate], Cephalexin, Cyanocobalamin, Glyburide, Metronidazole, Prednisone, Pregabalin, Vitamin b12, Ketoconazole, Mango flavor, Pineapple, and Strawberry flavor   Review of Systems Review of Systems   Physical Exam Triage Vital Signs ED Triage Vitals  Encounter Vitals Group     BP 05/30/23 1554 (!) 158/79     Systolic BP Percentile --      Diastolic BP Percentile --      Pulse Rate 05/30/23 1554 74     Resp 05/30/23 1554 20     Temp 05/30/23 1554 97.9 F (36.6 C)     Temp Source 05/30/23 1554 Oral     SpO2 05/30/23 1554 97 %     Weight --      Height --      Head Circumference --      Peak Flow --      Pain Score 05/30/23 1555 6     Pain Loc --      Pain Education --      Exclude from Growth Chart --    No data found.  Updated Vital Signs BP (!) 158/79 (BP Location: Left Arm)   Pulse 74    Temp 97.9 F (36.6 C) (Oral)   Resp 20   SpO2 97%   Visual Acuity Right Eye Distance:   Left Eye Distance:   Bilateral Distance:    Right Eye Near:   Left Eye Near:    Bilateral Near:     Physical Exam HENT:     Head: Normocephalic.  Cardiovascular:     Rate and Rhythm: Normal rate and regular rhythm.  Musculoskeletal:        General: Normal range of motion.     Cervical back: Normal range of motion.  Skin:    General: Skin is warm and dry.     Capillary Refill: Capillary refill takes less than 2 seconds.  Neurological:     General: No focal deficit present.     Mental Status: She is alert and oriented to person, place, and time.  Psychiatric:        Mood and Affect: Mood normal.        Behavior: Behavior normal.      UC Treatments / Results  Labs (all labs ordered are listed, but only abnormal results are displayed) Labs Reviewed - No data to display  EKG   Radiology No results found.  Procedures Procedures (including critical care time)  Medications Ordered in UC Medications - No data to display  Initial Impression / Assessment and Plan / UC Course  I have reviewed the triage vital signs and the nursing notes.  Pertinent labs & imaging results that were available during my care of the patient were reviewed by me and considered in my medical decision making (see chart  for details).     Nasal nodule Final Clinical Impressions(s) / UC Diagnoses   Final diagnoses:  Nasal polyp     Discharge Instructions      You have a nasal growth which resembles a polyp.  The recommendation is to continue with the ointment prescribed by your primary care provider.  You may also get benefit from using fluticasone however you have to be careful of overuse.  As discussed she may benefit from receiving an ENT referral from your primary care provider for further evaluation of the recurrent nasal breaths.    ED Prescriptions   None    PDMP not reviewed this  encounter.   Thad Ranger [place], [place] 05/30/23 2003

## 2023-05-30 NOTE — ED Triage Notes (Signed)
Pt c/o a knot inside of her rt nare for over a month. States her PCP gave her a mupirocin ointment for her nose and it drained like water.

## 2023-05-30 NOTE — Discharge Instructions (Addendum)
You have a nasal growth which resembles a polyp.  The recommendation is to continue with the ointment prescribed by your primary care provider.  You may also get benefit from using fluticasone however you have to be careful of overuse.  As discussed she may benefit from receiving an ENT referral from your primary care provider for further evaluation of the recurrent nasal breaths.

## 2023-06-06 DIAGNOSIS — Z794 Long term (current) use of insulin: Secondary | ICD-10-CM | POA: Diagnosis not present

## 2023-06-06 DIAGNOSIS — E113293 Type 2 diabetes mellitus with mild nonproliferative diabetic retinopathy without macular edema, bilateral: Secondary | ICD-10-CM | POA: Diagnosis not present

## 2023-06-06 DIAGNOSIS — I700 Atherosclerosis of aorta: Secondary | ICD-10-CM | POA: Diagnosis not present

## 2023-06-06 DIAGNOSIS — I10 Essential (primary) hypertension: Secondary | ICD-10-CM | POA: Diagnosis not present

## 2023-06-06 DIAGNOSIS — Z0000 Encounter for general adult medical examination without abnormal findings: Secondary | ICD-10-CM | POA: Diagnosis not present

## 2023-06-06 DIAGNOSIS — E782 Mixed hyperlipidemia: Secondary | ICD-10-CM | POA: Diagnosis not present

## 2023-06-13 DIAGNOSIS — N182 Chronic kidney disease, stage 2 (mild): Secondary | ICD-10-CM | POA: Diagnosis not present

## 2023-06-13 DIAGNOSIS — Z794 Long term (current) use of insulin: Secondary | ICD-10-CM | POA: Diagnosis not present

## 2023-06-13 DIAGNOSIS — Z0000 Encounter for general adult medical examination without abnormal findings: Secondary | ICD-10-CM | POA: Diagnosis not present

## 2023-06-13 DIAGNOSIS — M150 Primary generalized (osteo)arthritis: Secondary | ICD-10-CM | POA: Diagnosis not present

## 2023-06-13 DIAGNOSIS — I10 Essential (primary) hypertension: Secondary | ICD-10-CM | POA: Diagnosis not present

## 2023-06-13 DIAGNOSIS — I700 Atherosclerosis of aorta: Secondary | ICD-10-CM | POA: Diagnosis not present

## 2023-06-13 DIAGNOSIS — Z5181 Encounter for therapeutic drug level monitoring: Secondary | ICD-10-CM | POA: Diagnosis not present

## 2023-06-13 DIAGNOSIS — E782 Mixed hyperlipidemia: Secondary | ICD-10-CM | POA: Diagnosis not present

## 2023-06-13 DIAGNOSIS — E113293 Type 2 diabetes mellitus with mild nonproliferative diabetic retinopathy without macular edema, bilateral: Secondary | ICD-10-CM | POA: Diagnosis not present

## 2023-06-15 DIAGNOSIS — N182 Chronic kidney disease, stage 2 (mild): Secondary | ICD-10-CM | POA: Diagnosis not present

## 2023-06-15 DIAGNOSIS — Z794 Long term (current) use of insulin: Secondary | ICD-10-CM | POA: Diagnosis not present

## 2023-06-15 DIAGNOSIS — I10 Essential (primary) hypertension: Secondary | ICD-10-CM | POA: Diagnosis not present

## 2023-06-15 DIAGNOSIS — E113293 Type 2 diabetes mellitus with mild nonproliferative diabetic retinopathy without macular edema, bilateral: Secondary | ICD-10-CM | POA: Diagnosis not present

## 2023-06-15 DIAGNOSIS — E782 Mixed hyperlipidemia: Secondary | ICD-10-CM | POA: Diagnosis not present

## 2023-06-15 DIAGNOSIS — E1165 Type 2 diabetes mellitus with hyperglycemia: Secondary | ICD-10-CM | POA: Diagnosis not present

## 2023-06-19 DIAGNOSIS — Z794 Long term (current) use of insulin: Secondary | ICD-10-CM | POA: Diagnosis not present

## 2023-06-19 DIAGNOSIS — H18423 Band keratopathy, bilateral: Secondary | ICD-10-CM | POA: Diagnosis not present

## 2023-06-19 DIAGNOSIS — H0288B Meibomian gland dysfunction left eye, upper and lower eyelids: Secondary | ICD-10-CM | POA: Diagnosis not present

## 2023-06-19 DIAGNOSIS — E113212 Type 2 diabetes mellitus with mild nonproliferative diabetic retinopathy with macular edema, left eye: Secondary | ICD-10-CM | POA: Diagnosis not present

## 2023-06-19 DIAGNOSIS — Z961 Presence of intraocular lens: Secondary | ICD-10-CM | POA: Diagnosis not present

## 2023-06-19 DIAGNOSIS — H401131 Primary open-angle glaucoma, bilateral, mild stage: Secondary | ICD-10-CM | POA: Diagnosis not present

## 2023-06-19 DIAGNOSIS — H0288A Meibomian gland dysfunction right eye, upper and lower eyelids: Secondary | ICD-10-CM | POA: Diagnosis not present

## 2023-06-20 ENCOUNTER — Encounter: Payer: Medicare Other | Admitting: Obstetrics and Gynecology

## 2023-06-21 DIAGNOSIS — E1165 Type 2 diabetes mellitus with hyperglycemia: Secondary | ICD-10-CM | POA: Diagnosis not present

## 2023-06-30 ENCOUNTER — Ambulatory Visit: Payer: Medicare Other | Admitting: Obstetrics and Gynecology

## 2023-07-06 DIAGNOSIS — H401131 Primary open-angle glaucoma, bilateral, mild stage: Secondary | ICD-10-CM | POA: Diagnosis not present

## 2023-07-20 DIAGNOSIS — H59031 Cystoid macular edema following cataract surgery, right eye: Secondary | ICD-10-CM | POA: Diagnosis not present

## 2023-07-20 DIAGNOSIS — H0014 Chalazion left upper eyelid: Secondary | ICD-10-CM | POA: Diagnosis not present

## 2023-07-20 DIAGNOSIS — E113212 Type 2 diabetes mellitus with mild nonproliferative diabetic retinopathy with macular edema, left eye: Secondary | ICD-10-CM | POA: Diagnosis not present

## 2023-07-20 DIAGNOSIS — H401131 Primary open-angle glaucoma, bilateral, mild stage: Secondary | ICD-10-CM | POA: Diagnosis not present

## 2023-07-20 DIAGNOSIS — E113291 Type 2 diabetes mellitus with mild nonproliferative diabetic retinopathy without macular edema, right eye: Secondary | ICD-10-CM | POA: Diagnosis not present

## 2023-07-20 DIAGNOSIS — Z794 Long term (current) use of insulin: Secondary | ICD-10-CM | POA: Diagnosis not present

## 2023-08-15 ENCOUNTER — Encounter: Payer: Medicare Other | Admitting: Obstetrics and Gynecology

## 2023-08-28 DIAGNOSIS — H401131 Primary open-angle glaucoma, bilateral, mild stage: Secondary | ICD-10-CM | POA: Diagnosis not present

## 2023-08-30 ENCOUNTER — Encounter: Payer: Self-pay | Admitting: Podiatry

## 2023-08-30 ENCOUNTER — Ambulatory Visit: Payer: Medicare Other | Admitting: Podiatry

## 2023-08-30 DIAGNOSIS — B351 Tinea unguium: Secondary | ICD-10-CM

## 2023-08-30 DIAGNOSIS — E1142 Type 2 diabetes mellitus with diabetic polyneuropathy: Secondary | ICD-10-CM | POA: Diagnosis not present

## 2023-08-30 DIAGNOSIS — M79675 Pain in left toe(s): Secondary | ICD-10-CM | POA: Diagnosis not present

## 2023-08-30 DIAGNOSIS — M79674 Pain in right toe(s): Secondary | ICD-10-CM

## 2023-08-30 DIAGNOSIS — L84 Corns and callosities: Secondary | ICD-10-CM | POA: Diagnosis not present

## 2023-08-30 NOTE — Progress Notes (Signed)
Subjective:  Patient ID: Bonnie [name], female    DOB: [birth year],   MRN: [id number]  No chief complaint on file.   67 y.o. female concern of thickened elongated and painful nails that are difficult to trim. Also noted bilateral calluses.  Requesting to have them trimmed today. Relates burning and tingling in their feet. Patient is diabetic and last A1c was  Lab Results  Component Value Date   HGBA1C 7.1 (H) 04/20/2014   .   PCP:  Georgianne Fick, MD   .  Denies any other pedal complaints. Denies n/v/f/c.   Past Medical History:  Diagnosis Date   Anemia    Angina    Anxiety    Carpal tunnel syndrome 12/07/2015   Bilateral   Cyst of brain 2005   Diabetes mellitus    GERD (gastroesophageal reflux disease)    Hypercholesteremia    Hypertension    Obesity    Wears dentures    full set    Objective:  Physical Exam: Vascular: DP/PT pulses 2/4 bilateral. CFT <3 seconds. Absent hair growth on digits. Edema noted to bilateral lower extremities. Xerosis noted bilaterally.  Skin. No lacerations or abrasions bilateral feet. Nails 2-5 bilateral  are thickened discolored  with subungual debris. Hallux nails previously removed. Hyperkeratotic tissue noted to bilateral plantar first fourth and fifth metatrsal heads and bilateral plantar second digits.  Musculoskeletal: MMT 5/5 bilateral lower extremities in DF, PF, Inversion and Eversion. Deceased ROM in DF of ankle joint. No tenderness to palpation.  Neurological: Sensation intact to light touch. Protective sensation diminished bilateral.    Assessment:   1. Pain due to onychomycosis of toenails of both feet   2. Diabetic peripheral neuropathy associated with type 2 diabetes mellitus (HCC)       Plan:  Patient was evaluated and treated and all questions answered. -Discussed and educated patient on diabetic foot care, especially with  regards to the vascular, neurological and musculoskeletal systems.  -Stressed the importance of  good glycemic control and the detriment of not  controlling glucose levels in relation to the foot. -Discussed supportive shoes at all times and checking feet regularly.  -Mechanically debrided all nails 1-5 bilateral using sterile nail nipper and filed with dremel without incident  -Hyperkeratotic tissue debrided without incident with chisel  -Answered all patient questions -Patient to return  in 3 months for at risk foot care -Patient advised to call the office if any problems or questions arise in the meantime.    Louann Sjogren, DPM

## 2023-09-08 DIAGNOSIS — E1165 Type 2 diabetes mellitus with hyperglycemia: Secondary | ICD-10-CM | POA: Diagnosis not present

## 2023-09-15 DIAGNOSIS — E1121 Type 2 diabetes mellitus with diabetic nephropathy: Secondary | ICD-10-CM | POA: Diagnosis not present

## 2023-09-15 DIAGNOSIS — E782 Mixed hyperlipidemia: Secondary | ICD-10-CM | POA: Diagnosis not present

## 2023-09-15 DIAGNOSIS — Z794 Long term (current) use of insulin: Secondary | ICD-10-CM | POA: Diagnosis not present

## 2023-09-15 DIAGNOSIS — I10 Essential (primary) hypertension: Secondary | ICD-10-CM | POA: Diagnosis not present

## 2023-09-15 DIAGNOSIS — N182 Chronic kidney disease, stage 2 (mild): Secondary | ICD-10-CM | POA: Diagnosis not present

## 2023-09-15 DIAGNOSIS — E1165 Type 2 diabetes mellitus with hyperglycemia: Secondary | ICD-10-CM | POA: Diagnosis not present

## 2023-09-15 DIAGNOSIS — E113293 Type 2 diabetes mellitus with mild nonproliferative diabetic retinopathy without macular edema, bilateral: Secondary | ICD-10-CM | POA: Diagnosis not present

## 2023-10-17 DIAGNOSIS — E782 Mixed hyperlipidemia: Secondary | ICD-10-CM | POA: Diagnosis not present

## 2023-10-17 DIAGNOSIS — I10 Essential (primary) hypertension: Secondary | ICD-10-CM | POA: Diagnosis not present

## 2023-10-17 DIAGNOSIS — E1165 Type 2 diabetes mellitus with hyperglycemia: Secondary | ICD-10-CM | POA: Diagnosis not present

## 2023-10-20 DIAGNOSIS — Z1231 Encounter for screening mammogram for malignant neoplasm of breast: Secondary | ICD-10-CM | POA: Diagnosis not present

## 2023-10-24 DIAGNOSIS — Z5181 Encounter for therapeutic drug level monitoring: Secondary | ICD-10-CM | POA: Diagnosis not present

## 2023-10-24 DIAGNOSIS — I10 Essential (primary) hypertension: Secondary | ICD-10-CM | POA: Diagnosis not present

## 2023-10-24 DIAGNOSIS — M150 Primary generalized (osteo)arthritis: Secondary | ICD-10-CM | POA: Diagnosis not present

## 2023-10-24 DIAGNOSIS — Z794 Long term (current) use of insulin: Secondary | ICD-10-CM | POA: Diagnosis not present

## 2023-10-24 DIAGNOSIS — I700 Atherosclerosis of aorta: Secondary | ICD-10-CM | POA: Diagnosis not present

## 2023-10-24 DIAGNOSIS — E113293 Type 2 diabetes mellitus with mild nonproliferative diabetic retinopathy without macular edema, bilateral: Secondary | ICD-10-CM | POA: Diagnosis not present

## 2023-10-24 DIAGNOSIS — N182 Chronic kidney disease, stage 2 (mild): Secondary | ICD-10-CM | POA: Diagnosis not present

## 2023-10-24 DIAGNOSIS — E782 Mixed hyperlipidemia: Secondary | ICD-10-CM | POA: Diagnosis not present

## 2023-11-27 ENCOUNTER — Ambulatory Visit: Payer: Medicare Other | Admitting: Obstetrics and Gynecology

## 2023-11-27 ENCOUNTER — Encounter: Payer: Self-pay | Admitting: Obstetrics and Gynecology

## 2023-11-27 VITALS — BP 156/76 | HR 85

## 2023-11-27 DIAGNOSIS — N993 Prolapse of vaginal vault after hysterectomy: Secondary | ICD-10-CM | POA: Diagnosis not present

## 2023-11-27 DIAGNOSIS — N393 Stress incontinence (female) (male): Secondary | ICD-10-CM | POA: Diagnosis not present

## 2023-11-27 NOTE — Patient Instructions (Addendum)
 Try metamucil daily to help prevent the looser stools.   Plan for procedure: anterior and posterior repair with perineorrhaphy (fixing the front and back walls of the vagina) and sacrospinous ligament fixation (lifting the top of the vagina), urethral bulking (gel for leakage), cystoscopy (looking inside of the bladder)

## 2023-11-27 NOTE — Progress Notes (Signed)
 [facility] Return Visit  SUBJECTIVE  History of Present Illness: Bonnie [name] is a 68 y.o. female seen in follow-up for prolapse. She was previously scheduled for: anterior and posterior repair with perineorrhaphy and sacrospinous ligament fixation, cystoscopy but Hgb A1c was elevated (9.1%) and surgery was postponed.   She feels that her symptoms have not changed as far as the prolapse. Urinary leakage has been getting worse.   Has been on ozempic for about 3 months and has lost 10 lbs. Blood sugars are under good control. She feels a lot better. Has a follow up with her Endocrinologist in a couple weeks. Showed glucose logs and blood sugars were in range of 90s-110s.   She is having a YAG procedure for her eye in Feb  Past Medical History: Patient  has a past medical history of Anemia, Angina, Anxiety, Carpal tunnel syndrome (12/07/2015), Cyst of brain (2005), Diabetes mellitus, GERD (gastroesophageal reflux disease), Hypercholesteremia, Hypertension, Obesity, and Wears dentures.   Past Surgical History: She  has a past surgical history that includes Brain surgery; Back surgery; Brain surgery; Vaginal hysterectomy; Shoulder surgery; Coccyx removal; Tonsillectomy; Bladder surgery; Colonoscopy (Left, 04/22/2014); Esophagogastroduodenoscopy (egd) with propofol  (N/A, 02/12/2021); and Savory dilation (N/A, 02/12/2021).   Medications: She has a current medication list which includes the following prescription(s): acetaminophen , ascorbic acid, fifty50 glucose meter 2.0, carboxymethylcellulose, cholecalciferol, clonazepam , premarin, dexcom g6 sensor, dapagliflozin propanediol, insulin  glargine, insulin  pen needle, onetouch ultrasoft, latanoprost, liraglutide, metformin, onetouch verio, ramipril, rosuvastatin, ozempic (2 mg/dose), turmeric, valacyclovir, and dorzolamide-timolol.   Allergies: Patient is allergic to clarithromycin, exenatide, ibuprofen , methocarbamol, shellfish allergy,  ampicillin, carafate [sucralfate], cephalexin, cyanocobalamin, glyburide, metronidazole , prednisone, pregabalin, vitamin b12, ketoconazole, mango flavoring agent (non-screening), pineapple, and strawberry flavoring agent (non-screening).   Social History: Patient  reports that she quit smoking about 27 years ago. Her smoking use included cigarettes. She started smoking about 53 years ago. She has a 13 pack-year smoking history. She has never used smokeless tobacco. She reports current alcohol use. She reports that she does not use drugs.     OBJECTIVE     Physical Exam: Vitals:   11/27/23 1357 11/27/23 1433  BP: (!) 160/80 (!) 156/76  Pulse: 81 85   Gen: No apparent distress, A&O x 3.  Detailed Urogynecologic Evaluation:  Normal external genitalia. On speculum, normal vaginal mucosa. On bimanual, no masses present.   POP-Q  -1                                            Aa   -1                                           Ba  -3.5                                              C   4                                            Gh  2.5  Pb  6                                            tvl   0                                            Ap  0                                            Bp                                                 D      ASSESSMENT AND PLAN    Bonnie [name] is a 68 y.o. with:  1. Vaginal vault prolapse after hysterectomy   2. SUI (stress urinary incontinence, female)     Plan for surgery: Exam under anesthesia, anterior and posterior repair with perineorrhaphy, sacrospinous fixation, urethral bulking cystoscopy  - We reviewed the patient's specific anatomic and functional findings, with the assistance of diagrams, and together finalized the above procedure. The planned surgical procedures were discussed along with the surgical risks outlined below, which were also provided on a detailed handout. Additional treatment  options including expectant management, conservative management, medical management were discussed where appropriate.  We reviewed the benefits and risks of each treatment option.  - Previously was unsure about incontinence procedure but we discussed how leakage can be worse after prolapse repair. She would like to try urethral bulking. We discussed success rate of approximately 70-80% and possible need for second injection. We reviewed that this is not a permanent procedure and the Bulkamid does dissolve over time. Risks reviewed including injury to bladder or urethra, UTI, urinary retention and hematuria.    General Surgical Risks: For all procedures, there are risks of bleeding, infection, damage to surrounding organs including but not limited to bowel, bladder, blood vessels, ureters and nerves, and need for further surgery if an injury were to occur. These risks are all low with minimally invasive surgery.   There are risks of numbness and weakness at any body site or buttock/rectal pain.  It is possible that baseline pain can be worsened by surgery, either with or without mesh. If surgery is vaginal, there is also a low risk of possible conversion to laparoscopy or open abdominal incision where indicated. Very rare risks include blood transfusion, blood clot, heart attack, pneumonia, or death.   There is also a risk of short-term postoperative urinary retention with need to use a catheter. About half of patients need to go home from surgery with a catheter, which is then later removed in the office. The risk of Flannigan-term need for a catheter is very low. There is also a risk of worsening of overactive bladder.   Prolapse (with or without mesh): Risk factors for surgical failure  include things that put pressure on your pelvis and the surgical repair, including obesity, chronic cough, and heavy lifting or straining (including lifting children or adults, straining on the  toilet, or lifting heavy  objects such as furniture or anything weighing >25 lbs. Risks of recurrence is 20-30% with vaginal native tissue repair and a less than 10% with sacrocolpopexy with mesh.    - For preop Visit:  She is required to have a visit within 30 days of her surgery.   - Medical clearance: Needs A1c records- pt has upcoming appt with endocrinologist - Anticoagulant use: No - Medicaid Hysterectomy form: n/a - Accepts blood transfusion: No - Expected length of stay: outpatient  Request sent for surgery scheduling.   Rosaline LOISE Caper, MD

## 2023-12-04 ENCOUNTER — Telehealth: Payer: Self-pay

## 2023-12-04 NOTE — Telephone Encounter (Signed)
Patient called stating that she spoke to her ophthalmologist in regards to scheduling her surgery in March. Patient states her ophthalmologist gave her the okay for surgery and would like to get it scheduled.  Please follow up with the patient for surgery scheduling.

## 2023-12-05 ENCOUNTER — Encounter: Payer: Self-pay | Admitting: Podiatry

## 2023-12-05 ENCOUNTER — Ambulatory Visit: Payer: Medicare Other | Admitting: Podiatry

## 2023-12-05 DIAGNOSIS — B351 Tinea unguium: Secondary | ICD-10-CM

## 2023-12-05 DIAGNOSIS — L84 Corns and callosities: Secondary | ICD-10-CM

## 2023-12-05 DIAGNOSIS — E1142 Type 2 diabetes mellitus with diabetic polyneuropathy: Secondary | ICD-10-CM | POA: Diagnosis not present

## 2023-12-05 DIAGNOSIS — M79675 Pain in left toe(s): Secondary | ICD-10-CM | POA: Diagnosis not present

## 2023-12-05 DIAGNOSIS — M79674 Pain in right toe(s): Secondary | ICD-10-CM | POA: Diagnosis not present

## 2023-12-05 NOTE — Progress Notes (Signed)
  Subjective:  Patient ID: Bonnie [name], female    DOB: [birth year],   MRN: [id number]  Chief Complaint  Patient presents with   Nail Problem    dfc    68 y.o. female concern of thickened elongated and painful nails that are difficult to trim. Also noted bilateral calluses.  Requesting to have them trimmed today. Relates burning and tingling in their feet. Patient is diabetic and last A1c was  Lab Results  Component Value Date   HGBA1C 7.1 (H) 04/20/2014   .   PCP:  Georgianne Fick, MD   .  Denies any other pedal complaints. Denies n/v/f/c.   Past Medical History:  Diagnosis Date   Anemia    Angina    Anxiety    Carpal tunnel syndrome 12/07/2015   Bilateral   Cyst of brain 2005   Diabetes mellitus    GERD (gastroesophageal reflux disease)    Hypercholesteremia    Hypertension    Obesity    Wears dentures    full set    Objective:  Physical Exam: Vascular: DP/PT pulses 2/4 bilateral. CFT <3 seconds. Absent hair growth on digits. Edema noted to bilateral lower extremities. Xerosis noted bilaterally.  Skin. No lacerations or abrasions bilateral feet. Nails 2-5 bilateral  are thickened discolored  with subungual debris. Hallux nails previously removed. Hyperkeratotic tissue noted to bilateral plantar first fourth and fifth metatrsal heads and bilateral plantar second digits.  Musculoskeletal: MMT 5/5 bilateral lower extremities in DF, PF, Inversion and Eversion. Deceased ROM in DF of ankle joint. No tenderness to palpation.  Neurological: Sensation intact to light touch. Protective sensation diminished bilateral.    Assessment:   1. Pain due to onychomycosis of toenails of both feet   2. Diabetic peripheral neuropathy associated with type 2 diabetes mellitus (HCC)   3. Callus       Plan:  Patient was evaluated and treated and all questions answered. -Discussed and educated patient on diabetic foot care, especially with  regards to the vascular, neurological and  musculoskeletal systems.  -Stressed the importance of good glycemic control and the detriment of not  controlling glucose levels in relation to the foot.  -Discussed supportive shoes at all times and checking feet regularly.  -Mechanically debrided all nails 1-5 bilateral using sterile nail nipper and filed with dremel without incident  -Hyperkeratotic tissue debrided without incident with chisel x8 -Answered all patient questions -Patient to return  in 3 months for at risk foot care -Patient advised to call the office if any problems or questions arise in the meantime.    Louann Sjogren, DPM

## 2023-12-19 DIAGNOSIS — I700 Atherosclerosis of aorta: Secondary | ICD-10-CM | POA: Diagnosis not present

## 2023-12-19 DIAGNOSIS — Z794 Long term (current) use of insulin: Secondary | ICD-10-CM | POA: Diagnosis not present

## 2023-12-19 DIAGNOSIS — E782 Mixed hyperlipidemia: Secondary | ICD-10-CM | POA: Diagnosis not present

## 2023-12-19 DIAGNOSIS — N182 Chronic kidney disease, stage 2 (mild): Secondary | ICD-10-CM | POA: Diagnosis not present

## 2023-12-19 DIAGNOSIS — E113293 Type 2 diabetes mellitus with mild nonproliferative diabetic retinopathy without macular edema, bilateral: Secondary | ICD-10-CM | POA: Diagnosis not present

## 2023-12-26 DIAGNOSIS — E1165 Type 2 diabetes mellitus with hyperglycemia: Secondary | ICD-10-CM | POA: Diagnosis not present

## 2023-12-26 DIAGNOSIS — E782 Mixed hyperlipidemia: Secondary | ICD-10-CM | POA: Diagnosis not present

## 2023-12-26 DIAGNOSIS — I10 Essential (primary) hypertension: Secondary | ICD-10-CM | POA: Diagnosis not present

## 2023-12-26 DIAGNOSIS — E113293 Type 2 diabetes mellitus with mild nonproliferative diabetic retinopathy without macular edema, bilateral: Secondary | ICD-10-CM | POA: Diagnosis not present

## 2023-12-26 DIAGNOSIS — Z794 Long term (current) use of insulin: Secondary | ICD-10-CM | POA: Diagnosis not present

## 2023-12-26 DIAGNOSIS — N182 Chronic kidney disease, stage 2 (mild): Secondary | ICD-10-CM | POA: Diagnosis not present

## 2024-01-01 DIAGNOSIS — H401131 Primary open-angle glaucoma, bilateral, mild stage: Secondary | ICD-10-CM | POA: Diagnosis not present

## 2024-01-03 ENCOUNTER — Encounter (HOSPITAL_BASED_OUTPATIENT_CLINIC_OR_DEPARTMENT_OTHER): Payer: Self-pay | Admitting: *Deleted

## 2024-01-03 ENCOUNTER — Other Ambulatory Visit: Payer: Self-pay

## 2024-01-03 ENCOUNTER — Emergency Department (HOSPITAL_BASED_OUTPATIENT_CLINIC_OR_DEPARTMENT_OTHER)
Admission: EM | Admit: 2024-01-03 | Discharge: 2024-01-03 | Disposition: A | Discharge status: Home / Self Care | Payer: Medicare Other | Attending: Emergency Medicine | Admitting: Emergency Medicine

## 2024-01-03 ENCOUNTER — Emergency Department (HOSPITAL_BASED_OUTPATIENT_CLINIC_OR_DEPARTMENT_OTHER): Payer: Medicare Other

## 2024-01-03 DIAGNOSIS — E113212 Type 2 diabetes mellitus with mild nonproliferative diabetic retinopathy with macular edema, left eye: Secondary | ICD-10-CM | POA: Diagnosis not present

## 2024-01-03 DIAGNOSIS — Z961 Presence of intraocular lens: Secondary | ICD-10-CM | POA: Diagnosis not present

## 2024-01-03 DIAGNOSIS — E113291 Type 2 diabetes mellitus with mild nonproliferative diabetic retinopathy without macular edema, right eye: Secondary | ICD-10-CM | POA: Diagnosis not present

## 2024-01-03 DIAGNOSIS — M542 Cervicalgia: Secondary | ICD-10-CM | POA: Diagnosis not present

## 2024-01-03 DIAGNOSIS — H401131 Primary open-angle glaucoma, bilateral, mild stage: Secondary | ICD-10-CM | POA: Diagnosis not present

## 2024-01-03 DIAGNOSIS — H18421 Band keratopathy, right eye: Secondary | ICD-10-CM | POA: Diagnosis not present

## 2024-01-03 DIAGNOSIS — H0288A Meibomian gland dysfunction right eye, upper and lower eyelids: Secondary | ICD-10-CM | POA: Diagnosis not present

## 2024-01-03 DIAGNOSIS — Z794 Long term (current) use of insulin: Secondary | ICD-10-CM | POA: Diagnosis not present

## 2024-01-03 DIAGNOSIS — R070 Pain in throat: Secondary | ICD-10-CM | POA: Insufficient documentation

## 2024-01-03 MED ORDER — ALUM & MAG HYDROXIDE-SIMETH 200-200-20 MG/5ML PO SUSP
30.0000 mL | Freq: Once | ORAL | Status: AC
  Administered 2024-01-03: 30 mL via ORAL
  Filled 2024-01-03: qty 30

## 2024-01-03 MED ORDER — LIDOCAINE VISCOUS HCL 2 % MT SOLN: 10.0000 mL | Freq: Three times a day (TID) | OROMUCOSAL | 0 refills | Status: DC

## 2024-01-03 MED ORDER — FAMOTIDINE 20 MG PO TABS: 20.0000 mg | ORAL_TABLET | Freq: Two times a day (BID) | ORAL | 0 refills | Status: DC

## 2024-01-03 MED ORDER — LIDOCAINE VISCOUS HCL 2 % MT SOLN
15.0000 mL | Freq: Once | OROMUCOSAL | Status: AC
  Administered 2024-01-03: 15 mL via ORAL
  Filled 2024-01-03: qty 15

## 2024-01-03 NOTE — ED Provider Notes (Signed)
[place] EMERGENCY DEPARTMENT AT [facility] [place] Provider Note   CSN: [id number] Arrival date & time: 01/03/24  0017     History  No chief complaint on file.   Bonnie [name] is a 68 y.o. female.  The history is provided by the patient.  Swallowed Foreign Body This is a new problem. The current episode started 6 to 12 hours ago. The problem occurs constantly. The problem has not changed since onset.Pertinent negatives include no chest pain, no abdominal pain, no headaches and no shortness of breath. Nothing aggravates the symptoms. Nothing relieves the symptoms. She has tried nothing for the symptoms. The treatment provided no relief.  Patient ate meat of neck bones and feels pain in her esophagus.  She did not seen a bone but had pain in mid esophagus post eating and thinks one is stuck.  No vomiting.  Is able to swallow water and secretions.       Home Medications Prior to Admission medications   Medication Sig Start Date End Date Taking? Authorizing Provider  famotidine (PEPCID) 20 MG tablet Take 1 tablet (20 mg total) by mouth 2 (two) times daily. 01/03/24  Yes Pattiann Solanki, MD  acetaminophen (TYLENOL) 500 MG tablet Take 1 tablet (500 mg total) by mouth every 6 (six) hours as needed (pain). 01/20/23   Marguerita Beards, MD  ascorbic acid (VITAMIN C) 1000 MG tablet Take 1 tablet by mouth daily.    [provider]  Blood Glucose Monitoring Suppl (FIFTY50 GLUCOSE METER 2.0) w/Device KIT OneTouch UltraMini kit  USE AS DIRECTED TO CHECK BLOOD SUGARS TWICE A DAY    [provider]  carboxymethylcellulose (REFRESH PLUS) 0.5 % SOLN Place 1 drop into both eyes 3 (three) times daily as needed.    [provider]  Cholecalciferol 125 MCG (5000 UT) TABS Take by mouth.    [provider]  clonazePAM (KLONOPIN) 1 MG tablet Take 1 mg by mouth at bedtime. 01/05/22   [provider]  conjugated estrogens (PREMARIN) vaginal cream See admin  instructions. 05/07/21   [provider]  Continuous Glucose Sensor (DEXCOM G6 SENSOR) MISC  11/28/22   [provider]  dapagliflozin propanediol (FARXIGA) 5 MG TABS tablet Take 5 mg by mouth in the morning.    [provider]  dorzolamide-timolol (COSOPT) 22.3-6.8 MG/ML ophthalmic solution Place 1 drop into both eyes 2 (two) times daily. Patient not taking: Reported on 11/27/2023 12/09/21   [provider]  insulin glargine (LANTUS) 100 UNIT/ML injection Inject 11 Units into the skin at bedtime.    [provider]  Insulin Pen Needle 31G X 5 MM MISC USE TWICE A DAY 03/28/18   [provider]  Lancets (ONETOUCH ULTRASOFT) lancets OneTouch UltraSoft Lancets    [provider]  latanoprost (XALATAN) 0.005 % ophthalmic solution SMARTSIG:In Eye(s)    [provider]  liraglutide (VICTOZA) 18 MG/3ML SOPN Inject 1.8 mg into the skin daily.    [provider]  metFORMIN (GLUCOPHAGE) 1000 MG tablet Take 1,000 mg by mouth 2 (two) times daily with a meal.    [provider]  [facility] VERIO test strip 2 (two) times daily. use for testing 04/03/19   [provider]  ramipril (ALTACE) 5 MG capsule Take 5 mg by mouth daily. 04/29/23   [provider]  rosuvastatin (CRESTOR) 5 MG tablet Take 40 mg by mouth daily.    [provider]  Semaglutide, 2 MG/DOSE, (OZEMPIC, 2 MG/DOSE,) 8  MG/3ML SOPN Inject into the skin.    [provider]  TURMERIC PO Take 1 capsule by mouth in the morning.    [provider]  valACYclovir (VALTREX) 1000 MG tablet Take 500 mg by mouth daily. 04/29/23   [provider]      Allergies    Clarithromycin, Exenatide, Ibuprofen, Methocarbamol, Shellfish allergy, Ampicillin, Carafate [sucralfate], Cephalexin, Cyanocobalamin, Glyburide, Metronidazole, Prednisone, Pregabalin, Vitamin b12, Ketoconazole, Mango flavoring agent (non-screening), Pineapple, and  Strawberry flavoring agent (non-screening)    Review of Systems   Review of Systems  Constitutional:  Negative for fever.  Respiratory:  Negative for shortness of breath, wheezing and stridor.   Cardiovascular:  Negative for chest pain.  Gastrointestinal:  Negative for abdominal pain.  Neurological:  Negative for headaches.  All other systems reviewed and are negative.   Physical Exam Updated Vital Signs BP (!) 185/83 (BP Location: Right Arm)   Pulse 68   Temp 97.7 F (36.5 C)   Resp 17   SpO2 100%  Physical Exam Vitals and nursing note reviewed.  Constitutional:      General: She is not in acute distress.    Appearance: Normal appearance. She is well-developed.  HENT:     Head: Normocephalic and atraumatic.     Mouth/Throat:     Mouth: Mucous membranes are moist.     Pharynx: Oropharynx is clear.  Eyes:     Pupils: Pupils are equal, round, and reactive to light.  Cardiovascular:     Rate and Rhythm: Normal rate and regular rhythm.     Pulses: Normal pulses.     Heart sounds: Normal heart sounds.  Pulmonary:     Effort: Pulmonary effort is normal. No respiratory distress.     Breath sounds: Normal breath sounds. No stridor.  Abdominal:     General: Bowel sounds are normal. There is no distension.     Palpations: Abdomen is soft.     Tenderness: There is no abdominal tenderness. There is no guarding or rebound.  Musculoskeletal:        General: Normal range of motion.     Cervical back: Normal range of motion and neck supple.  Skin:    General: Skin is dry.     Capillary Refill: Capillary refill takes less than 2 seconds.     Findings: No erythema or rash.  Neurological:     General: No focal deficit present.     Mental Status: She is alert.     Deep Tendon Reflexes: Reflexes normal.  Psychiatric:        Mood and Affect: Mood normal.     ED Results / Procedures / Treatments   Labs (all labs ordered are listed, but only abnormal results are displayed) Labs  Reviewed - No data to display  EKG None  Radiology DG Neck Soft Tissue Result Date: 01/03/2024 CLINICAL DATA:  Neck pain following eating, possible retained food bolus EXAM: NECK SOFT TISSUES - 1+ VIEW COMPARISON:  None Available. FINDINGS: Epiglottis and aryepiglottic folds are within normal limits. Prevertebral soft tissues are unremarkable. No foreign body is identified. Mild carotid calcifications are seen. No bony abnormality is noted. IMPRESSION: No acute abnormality noted. Electronically Signed   By: Alcide Clever M.D.   On: 01/03/2024 01:14    Procedures Procedures    Medications Ordered in ED Medications  alum & mag hydroxide-simeth (MAALOX/MYLANTA) 200-200-20 MG/5ML suspension 30 mL (30 mLs Oral Given 01/03/24 0214)    And  lidocaine (XYLOCAINE) 2 %  viscous mouth solution 15 mL (15 mLs Oral Given 01/03/24 0214)    ED Course/ Medical Decision Making/ A&P                                 Medical Decision Making Patient eating meat off of neck bones and then had pain and is concerned for FB  Amount and/or Complexity of Data Reviewed Independent Historian: spouse    Details: See above  Radiology: ordered and independent interpretation performed.    Details: No FB by me   Risk OTC drugs. Prescription drug management. Risk Details: No FB on xray.  Handling secretions.  No signs of obstruction. Likely residual pain.  Gi cocktail with viscus lidocaine. I have explained to the patient  she will be taking medication at home and eating soft foods and will need to call GI for follow up if symptoms persist.  Stable for discharge.  Strict returns    Final Clinical Impression(s) / ED Diagnoses Final diagnoses:  Throat pain in adult   I have reviewed the triage vital signs and the nursing notes. Pertinent labs & imaging results that were available during my care of the patient were reviewed by me and considered in my medical decision making (see chart for details). After history,  exam, and medical workup I feel the patient has been appropriately medically screened and is safe for discharge home. Pertinent diagnoses were discussed with the patient. Patient was given return precautions.  Rx / DC Orders ED Discharge Orders          Ordered    famotidine (PEPCID) 20 MG tablet  2 times daily        01/03/24 0244              Yordy Matton, MD 01/03/24 4098

## 2024-01-03 NOTE — ED Triage Notes (Signed)
Pt states that she has soreness in her esophagus since eating dinner.  Pt reports that she thinks a piece of bone is stuck.  Pt has been able to drink water but does not feel that the object has gone down.  No sob or distress in triage.  This object became lodged around 6pm

## 2024-01-11 ENCOUNTER — Telehealth: Payer: Self-pay

## 2024-01-11 NOTE — Telephone Encounter (Signed)
 Left message to return call. Results have been pulled from care-everywhere and placed on Dr Jari Favre desk for review.

## 2024-01-11 NOTE — Telephone Encounter (Signed)
-----   Message from Marguerita Beards sent at 01/11/2024  1:40 PM EST ----- Regarding: A1c Pt was supposed to have an upcoming appt with her endocrinologist. We need an updated A1c prior to her surgery. Can someone please contact the patient and ask if those records can be faxed to [place]? Thank you

## 2024-01-12 ENCOUNTER — Other Ambulatory Visit: Payer: Self-pay | Admitting: Obstetrics and Gynecology

## 2024-01-12 ENCOUNTER — Ambulatory Visit: Payer: Medicare Other | Admitting: Obstetrics and Gynecology

## 2024-01-12 ENCOUNTER — Encounter: Payer: Self-pay | Admitting: Obstetrics and Gynecology

## 2024-01-12 VITALS — BP 120/63 | HR 75 | Wt 185.0 lb

## 2024-01-12 DIAGNOSIS — N993 Prolapse of vaginal vault after hysterectomy: Secondary | ICD-10-CM

## 2024-01-12 DIAGNOSIS — Z01818 Encounter for other preprocedural examination: Secondary | ICD-10-CM

## 2024-01-12 MED ORDER — POLYETHYLENE GLYCOL 3350 17 GM/SCOOP PO POWD: 17.0000 g | Freq: Every day | ORAL | 0 refills | Status: AC

## 2024-01-12 MED ORDER — ACETAMINOPHEN 500 MG PO TABS: 500.0000 mg | ORAL_TABLET | Freq: Four times a day (QID) | ORAL | 0 refills | Status: AC | PRN

## 2024-01-12 MED ORDER — TRAMADOL HCL 50 MG PO TABS: 50.0000 mg | ORAL_TABLET | Freq: Four times a day (QID) | ORAL | 0 refills | Status: AC | PRN

## 2024-01-12 NOTE — H&P (Signed)
 [facility] H&P  Subjective Chief Complaint: Bonnie [name] presents for a preoperative encounter.   History of Present Illness: Bonnie [name] is a 68 y.o. female who presents for preoperative visit.  She is scheduled to undergo Exam under anesthesia, anterior and posterior repair with perineorrhaphy and sacrospinous ligament fixation, cystoscopy  on 02/06/24.  Her symptoms include pelvic organ prolapse, and she was was found to have Stage II anterior, Stage II posterior, Stage I apical prolapse.   Urodynamics showed: 1. Sensation was normal; capacity was normal 2. Stress Incontinence was demonstrated at normal pressures, but only while standing; 3. Detrusor Overactivity was not demonstrated. 4. Emptying was dysfunctional with a normal PVR, a sustained detrusor contraction present,  abdominal straining not present, dyssynergic urethral sphincter activity on EMG.  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   COCCYX REMOVAL     COLONOSCOPY Left 04/22/2014   Procedure: COLONOSCOPY;  Surgeon: Theda Belfast, MD;  Location: [facility] ENDOSCOPY;  Service: Endoscopy;  Laterality: Left;   ESOPHAGOGASTRODUODENOSCOPY (EGD) WITH PROPOFOL N/A 02/12/2021   Procedure: ESOPHAGOGASTRODUODENOSCOPY (EGD) WITH PROPOFOL;  Surgeon: Jeani Hawking, MD;  Location: [facility] ENDOSCOPY;  Service: Endoscopy;  Laterality: N/A;   SAVORY DILATION N/A 02/12/2021   Procedure: SAVORY DILATION;  Surgeon: Jeani Hawking, MD;  Location: [facility] ENDOSCOPY;  Service: Endoscopy;  Laterality: N/A;   SHOULDER SURGERY      TONSILLECTOMY     VAGINAL HYSTERECTOMY      is allergic to clarithromycin, exenatide, ibuprofen, methocarbamol, shellfish allergy, ampicillin, carafate [sucralfate], cephalexin, cyanocobalamin, glyburide, metronidazole, prednisone, pregabalin, vitamin b12, ketoconazole, mango flavoring agent (non-screening), pineapple, and strawberry flavoring agent (non-screening).   Family History  Problem Relation Age of Onset   Diabetes Mother    Lung cancer Father    Diabetes Brother     Social History   Tobacco Use   Smoking status: Former    Current packs/day: 0.00    Average packs/day: 0.5 packs/day for 26.0 years (13.0 ttl pk-yrs)    Types: Cigarettes    Start date: 77    Quit date: 1998    Years since quitting: 27.1   Smokeless tobacco: Never  Vaping Use   Vaping status: Never Used  Substance Use Topics   Alcohol use: Yes    Comment: occasionally   Drug use: No     Review of Systems was negative for a full 10 system review except as noted in the History of Present Illness.  No current facility-administered medications for this encounter.  Current Outpatient Medications:    acetaminophen (TYLENOL) 500 MG tablet, Take 1 tablet (500 mg total) by mouth every 6 (six) hours as needed (pain)., Disp: 30 tablet, Rfl: 0   acetaminophen (TYLENOL) 500 MG tablet, Take 1 tablet (500 mg total) by mouth every 6 (six) hours as needed (pain)., Disp: 30 tablet, Rfl: 0   ascorbic acid (VITAMIN C) 1000 MG  tablet, Take 1 tablet by mouth daily., Disp: , Rfl:    Blood Glucose Monitoring Suppl (FIFTY50 GLUCOSE METER 2.0) w/Device KIT, OneTouch UltraMini kit  USE AS DIRECTED TO CHECK BLOOD SUGARS TWICE A DAY, Disp: , Rfl:    carboxymethylcellulose (REFRESH PLUS) 0.5 % SOLN, Place 1 drop into both eyes 3 (three) times daily as needed., Disp: , Rfl:    Cholecalciferol 125 MCG (5000 UT) TABS, Take by mouth., Disp: , Rfl:    clonazePAM (KLONOPIN) 1 MG tablet, Take 1 mg by mouth at bedtime., Disp: , Rfl:     conjugated estrogens (PREMARIN) vaginal cream, See admin instructions., Disp: , Rfl:    Continuous Glucose Sensor (DEXCOM G6 SENSOR) MISC, , Disp: , Rfl:    dapagliflozin propanediol (FARXIGA) 5 MG TABS tablet, Take 5 mg by mouth in the morning., Disp: , Rfl:    dorzolamide-timolol (COSOPT) 22.3-6.8 MG/ML ophthalmic solution, Place 1 drop into both eyes 2 (two) times daily., Disp: , Rfl:    famotidine (PEPCID) 20 MG tablet, Take 1 tablet (20 mg total) by mouth 2 (two) times daily., Disp: 10 tablet, Rfl: 0   insulin glargine (LANTUS) 100 UNIT/ML injection, Inject 11 Units into the skin at bedtime., Disp: , Rfl:    Insulin Pen Needle 31G X 5 MM MISC, USE TWICE A DAY, Disp: , Rfl:    Lancets (ONETOUCH ULTRASOFT) lancets, OneTouch UltraSoft Lancets, Disp: , Rfl:    latanoprost (XALATAN) 0.005 % ophthalmic solution, SMARTSIG:In Eye(s), Disp: , Rfl:    magic mouthwash (lidocaine, diphenhydrAMINE, alum & mag hydroxide) suspension, Swish and swallow 10 mLs 3 (three) times daily., Disp: 80 mL, Rfl: 0   metFORMIN (GLUCOPHAGE) 1000 MG tablet, Take 1,000 mg by mouth 2 (two) times daily with a meal., Disp: , Rfl:    ONETOUCH VERIO test strip, 2 (two) times daily. use for testing, Disp: , Rfl:    polyethylene glycol powder (GLYCOLAX/MIRALAX) 17 GM/SCOOP powder, Take 17 g by mouth daily. Drink 17g (1 scoop) dissolved in water per day., Disp: 255 g, Rfl: 0   ramipril (ALTACE) 5 MG capsule, Take 5 mg by mouth daily., Disp: , Rfl:    rosuvastatin (CRESTOR) 5 MG tablet, Take 40 mg by mouth daily., Disp: , Rfl:    Semaglutide, 2 MG/DOSE, (OZEMPIC, 2 MG/DOSE,) 8 MG/3ML SOPN, Inject into the skin., Disp: , Rfl:    traMADol (ULTRAM) 50 MG tablet, Take 1 tablet (50 mg total) by mouth every 6 (six) hours as needed for up to 5 days., Disp: 20 tablet, Rfl: 0   TURMERIC PO, Take 1 capsule by mouth in the morning., Disp: , Rfl:    valACYclovir (VALTREX) 1000 MG tablet, Take 500 mg by mouth daily., Disp: , Rfl:     Objective There were no vitals filed for this visit.  Gen: NAD CV: S1 S2 RRR Lungs: Clear to auscultation bilaterally Abd: soft, nontender   Previous Pelvic Exam showed: Normal external genitalia. On speculum, normal vaginal mucosa. On bimanual, no masses present.    POP-Q   -1                                            Aa   -1  Ba   -3.5                                              C    4                                            Gh   2.5                                            Pb   6                                            tvl    0                                            Ap   0                                            Bp                       Assessment/ Plan  Assessment: The patient is a 68 y.o. year old scheduled to undergo  Exam under anesthesia, anterior and posterior repair with perineorrhaphy and sacrospinous ligament fixation, cystoscopy. Verbal consent was obtained for these procedures.

## 2024-01-12 NOTE — Patient Instructions (Signed)
 Pick up your medications but don't take them until after surgery  Remember to hold turmeric for 7 days prior to surgery.

## 2024-01-12 NOTE — Progress Notes (Signed)
 [facility] Pre-Operative Exam  Subjective Chief Complaint: Bonnie [name] presents for a preoperative encounter.   History of Present Illness: Bonnie [name] is a 68 y.o. female who presents for preoperative visit.  She is scheduled to undergo Exam under anesthesia, anterior and posterior repair with perineorrhaphy and sacrospinous ligament fixation, cystoscopy  on 02/06/24.  Her symptoms include pelvic organ prolapse, and she was was found to have Stage II anterior, Stage II posterior, Stage I apical prolapse.   Urodynamics showed: 1. Sensation was normal; capacity was normal 2. Stress Incontinence was demonstrated at normal pressures, but only while standing; 3. Detrusor Overactivity was not demonstrated. 4. Emptying was dysfunctional with a normal PVR, a sustained detrusor contraction present,  abdominal straining not present, dyssynergic urethral sphincter activity on EMG.  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   COCCYX REMOVAL     COLONOSCOPY Left 04/22/2014   Procedure: COLONOSCOPY;  Surgeon: Theda Belfast, MD;  Location: [facility] ENDOSCOPY;  Service: Endoscopy;  Laterality: Left;   ESOPHAGOGASTRODUODENOSCOPY (EGD) WITH PROPOFOL N/A 02/12/2021   Procedure: ESOPHAGOGASTRODUODENOSCOPY (EGD) WITH PROPOFOL;  Surgeon: Jeani Hawking, MD;  Location: [facility] ENDOSCOPY;  Service: Endoscopy;  Laterality: N/A;   SAVORY DILATION N/A 02/12/2021   Procedure: SAVORY DILATION;  Surgeon: Jeani Hawking, MD;  Location: [facility] ENDOSCOPY;  Service: Endoscopy;  Laterality: N/A;   SHOULDER SURGERY      TONSILLECTOMY     VAGINAL HYSTERECTOMY      is allergic to clarithromycin, exenatide, ibuprofen, methocarbamol, shellfish allergy, ampicillin, carafate [sucralfate], cephalexin, cyanocobalamin, glyburide, metronidazole, prednisone, pregabalin, vitamin b12, ketoconazole, mango flavoring agent (non-screening), pineapple, and strawberry flavoring agent (non-screening).   Family History  Problem Relation Age of Onset   Diabetes Mother    Lung cancer Father    Diabetes Brother     Social History   Tobacco Use   Smoking status: Former    Current packs/day: 0.00    Average packs/day: 0.5 packs/day for 26.0 years (13.0 ttl pk-yrs)    Types: Cigarettes    Start date: 52    Quit date: 1998    Years since quitting: 27.1   Smokeless tobacco: Never  Vaping Use   Vaping status: Never Used  Substance Use Topics   Alcohol use: Yes    Comment: occasionally   Drug use: No     Review of Systems was negative for a full 10 system review except as noted in the History of Present Illness.   Current Outpatient Medications:    acetaminophen (TYLENOL) 500 MG tablet, Take 1 tablet (500 mg total) by mouth every 6 (six) hours as needed (pain)., Disp: 30 tablet, Rfl: 0   ascorbic acid (VITAMIN C) 1000 MG tablet, Take 1 tablet by mouth daily., Disp: , Rfl:    Blood Glucose Monitoring Suppl (FIFTY50 GLUCOSE METER 2.0) w/Device KIT, OneTouch UltraMini kit  USE AS DIRECTED TO CHECK BLOOD  SUGARS TWICE A DAY, Disp: , Rfl:    carboxymethylcellulose (REFRESH PLUS) 0.5 % SOLN, Place 1 drop into both eyes 3 (three) times daily as needed., Disp: , Rfl:    Cholecalciferol 125 MCG (5000 UT) TABS, Take by mouth., Disp: , Rfl:    clonazePAM (KLONOPIN) 1 MG tablet, Take 1 mg by mouth at bedtime., Disp: , Rfl:    conjugated estrogens (PREMARIN) vaginal cream, See admin instructions., Disp: , Rfl:    Continuous Glucose Sensor (DEXCOM G6 SENSOR) MISC, , Disp: , Rfl:    dapagliflozin propanediol (FARXIGA) 5 MG TABS  tablet, Take 5 mg by mouth in the morning., Disp: , Rfl:    dorzolamide-timolol (COSOPT) 22.3-6.8 MG/ML ophthalmic solution, Place 1 drop into both eyes 2 (two) times daily. (Patient not taking: Reported on 11/27/2023), Disp: , Rfl:    famotidine (PEPCID) 20 MG tablet, Take 1 tablet (20 mg total) by mouth 2 (two) times daily., Disp: 10 tablet, Rfl: 0   insulin glargine (LANTUS) 100 UNIT/ML injection, Inject 11 Units into the skin at bedtime., Disp: , Rfl:    Insulin Pen Needle 31G X 5 MM MISC, USE TWICE A DAY, Disp: , Rfl:    Lancets (ONETOUCH ULTRASOFT) lancets, OneTouch UltraSoft Lancets, Disp: , Rfl:    latanoprost (XALATAN) 0.005 % ophthalmic solution, SMARTSIG:In Eye(s), Disp: , Rfl:    liraglutide (VICTOZA) 18 MG/3ML SOPN, Inject 1.8 mg into the skin daily., Disp: , Rfl:    magic mouthwash (lidocaine, diphenhydrAMINE, alum & mag hydroxide) suspension, Swish and swallow 10 mLs 3 (three) times daily., Disp: 80 mL, Rfl: 0   metFORMIN (GLUCOPHAGE) 1000 MG tablet, Take 1,000 mg by mouth 2 (two) times daily with a meal., Disp: , Rfl:    ONETOUCH VERIO test strip, 2 (two) times daily. use for testing, Disp: , Rfl:    ramipril (ALTACE) 5 MG capsule, Take 5 mg by mouth daily., Disp: , Rfl:    rosuvastatin (CRESTOR) 5 MG tablet, Take 40 mg by mouth daily., Disp: , Rfl:    Semaglutide, 2 MG/DOSE, (OZEMPIC, 2 MG/DOSE,) 8 MG/3ML SOPN, Inject into the skin., Disp: , Rfl:    TURMERIC PO, Take 1 capsule by mouth in the morning., Disp: , Rfl:    valACYclovir (VALTREX) 1000 MG tablet, Take 500 mg by mouth daily., Disp: , Rfl:    Objective There were no vitals filed for this visit.  Gen: NAD CV: S1 S2 RRR Lungs: Clear to auscultation bilaterally Abd: soft, nontender   Previous Pelvic Exam showed: Normal external genitalia. On speculum, normal vaginal mucosa. On bimanual, no masses present.    POP-Q   -1                                            Aa   -1                                            Ba   -3.5                                              C    4  Gh   2.5                                            Pb   6                                            tvl    0                                            Ap   0                                            Bp                       Assessment/ Plan  Assessment: The patient is a 68 y.o. year old scheduled to undergo  Exam under anesthesia, anterior and posterior repair with perineorrhaphy and sacrospinous ligament fixation, cystoscopy. Verbal consent was obtained for these procedures.  Plan: General Surgical Consent: The patient has previously been counseled on alternative treatments, and the decision by the patient and provider was to proceed with the procedure listed above.  For all procedures, there are risks of bleeding, infection, damage to surrounding organs including but not limited to bowel, bladder, blood vessels, ureters and nerves, and need for further surgery if an injury were to occur. These risks are all low with minimally invasive surgery.   There are risks of numbness and weakness at any body site or buttock/rectal pain.  It is possible that baseline pain can be worsened by surgery, either with or without mesh. If surgery is vaginal, there is also a low risk of possible conversion to laparoscopy or open abdominal incision where indicated. Very rare risks include blood transfusion, blood clot, heart attack, pneumonia, or death.   There is also a risk of short-term postoperative urinary retention with need to use a catheter. About half of patients need to go home from surgery with a catheter, which is then later removed in the office. The risk of Giron-term need for a catheter is very low. There is also a risk of worsening of overactive bladder.     Prolapse (with or without mesh): Risk factors for surgical failure  include things that put pressure on your pelvis and  the surgical repair, including obesity, chronic cough, and heavy lifting or straining (including lifting children or adults, straining on the toilet, or lifting heavy objects such as furniture or anything weighing >25 lbs. Risks of recurrence is 20-30% with vaginal native tissue repair and a less than 10% with sacrocolpopexy with mesh.     We discussed consent for blood products. Risks for blood transfusion include allergic reactions, other reactions that can affect different body organs and managed accordingly, transmission of infectious diseases such as HIV or Hepatitis. However, the blood is screened. Patient consents for blood products.  Pre-operative instructions:  She was instructed to not take Aspirin/NSAIDs x 7days prior to surgery. Antibiotic prophylaxis was ordered as indicated.  Catheter  use: Patient will go home with foley if needed after post-operative voiding trial.  Post-operative instructions:  She was provided with specific post-operative instructions, including precautions and signs/symptoms for which we would recommend contacting us, in addition to daytime and after-hours contact phone numbers. This was provided on a handout.   Post-operative medications: Prescriptions for  tylenol, miralax, and tramadol were sent to her pharmacy. Discussed using ibuprofen and tylenol on a schedule to limit use of narcotics.   Laboratory testing:  We will check labs: as requested by anesthesia  Preoperative clearance:  She does require surgical clearance.  A1c recheck on 12/19/23 was 7.5.  Post-operative follow-up:  A post-operative appointment will be made for 6 weeks from the date of surgery. If she needs a post-operative nurse visit for a voiding trial, that will be set up after she leaves the hospital.    Patient will call the clinic or use MyChart should anything change or any new issues arise.   Selmer Dominion, NP

## 2024-01-17 ENCOUNTER — Ambulatory Visit (INDEPENDENT_AMBULATORY_CARE_PROVIDER_SITE_OTHER): Admitting: Podiatry

## 2024-01-17 ENCOUNTER — Encounter: Payer: Self-pay | Admitting: Podiatry

## 2024-01-17 VITALS — Ht 68.0 in | Wt 185.0 lb

## 2024-01-17 DIAGNOSIS — E1142 Type 2 diabetes mellitus with diabetic polyneuropathy: Secondary | ICD-10-CM

## 2024-01-17 DIAGNOSIS — L84 Corns and callosities: Secondary | ICD-10-CM

## 2024-01-17 DIAGNOSIS — M79674 Pain in right toe(s): Secondary | ICD-10-CM

## 2024-01-17 DIAGNOSIS — M79675 Pain in left toe(s): Secondary | ICD-10-CM

## 2024-01-17 DIAGNOSIS — B351 Tinea unguium: Secondary | ICD-10-CM

## 2024-01-17 DIAGNOSIS — E119 Type 2 diabetes mellitus without complications: Secondary | ICD-10-CM

## 2024-01-21 NOTE — Progress Notes (Signed)
 ANNUAL DIABETIC FOOT EXAM  Subjective: Rees Matura Apple presents today for annual diabetic foot exam.  Chief Complaint  Patient presents with   Nail Problem    Pt is here for [facility] last A1C was 7.5 PCP is Dr Nicholos Johns and LOV was in December.   Patient confirms h/o diabetes.  Patient denies any h/o foot wounds.  Patient has been diagnosed with neuropathy.  Georgianne Fick, MD is patient's PCP.  Past Medical History:  Diagnosis Date   Anemia    Angina    Anxiety    Carpal tunnel syndrome 12/07/2015   Bilateral   Cyst of brain 2005   Diabetes mellitus    GERD (gastroesophageal reflux disease)    Hypercholesteremia    Hypertension    Obesity    Wears dentures    full set   Patient Active Problem List   Diagnosis Date Noted   Degeneration of lumbar intervertebral disc 12/11/2021   Diabetic renal disease (HCC) 12/11/2021   Adult general medical exam 12/11/2021   Hardening of the aorta (main artery of the heart) (HCC) 12/11/2021   Hordeolum externum of right upper eyelid 08/20/2020   Glaucoma 06/23/2020   History of spinal fusion 11/27/2019   Chalazion of left upper eyelid 06/27/2019   Primary open angle glaucoma of both eyes, mild stage 06/27/2019   Hypertensive disorder 04/23/2019   Cervical radiculopathy 04/24/2018   Cystoid macular edema following cataract surgery, right eye 05/04/2017   Cervical pain 03/07/2016   Sickle cell trait (HCC) 03/07/2016   Carpal tunnel syndrome 12/07/2015   Pain in female genitalia on intercourse 09/17/2014   Nausea with vomiting 04/22/2014   Diarrhea 04/22/2014   Essential hypertension, benign 04/20/2014   Ileitis 04/19/2014   Cataract 12/25/2013   Dry eye syndrome 12/25/2013   Chronic low back pain 02/27/2013   Hernia, rectovaginal 02/12/2013   Dysfunctional voiding of urine 02/12/2013   Bladder cystocele 12/06/2012   Diabetes mellitus (HCC) 12/06/2012   HLD (hyperlipidemia) 12/06/2012   Benign neoplasm of meninges (HCC)  12/06/2012   Mixed incontinence 12/06/2012   Open angle with borderline findings, high risk 10/17/2011   Chest pain, atypical 10/01/2011   Shortness of breath 10/01/2011   DM type 2 (diabetes mellitus, type 2) (HCC) 10/01/2011   Type 2 diabetes mellitus with both eyes affected by mild nonproliferative retinopathy without macular edema, with Northrup-term current use of insulin (HCC) 10/01/2011   Hypercholesteremia    Anemia    Obesity    Anxiety    GERD (gastroesophageal reflux disease)    Past Surgical History:  Procedure Laterality Date   BACK SURGERY     2001 for DJD   BLADDER SURGERY     BRAIN SURGERY     BRAIN SURGERY     For meningioma.   COCCYX REMOVAL     COLONOSCOPY Left 04/22/2014   Procedure: COLONOSCOPY;  Surgeon: Theda Belfast, MD;  Location: [facility] ENDOSCOPY;  Service: Endoscopy;  Laterality: Left;   ESOPHAGOGASTRODUODENOSCOPY (EGD) WITH PROPOFOL N/A 02/12/2021   Procedure: ESOPHAGOGASTRODUODENOSCOPY (EGD) WITH PROPOFOL;  Surgeon: Jeani Hawking, MD;  Location: [facility] ENDOSCOPY;  Service: Endoscopy;  Laterality: N/A;   SAVORY DILATION N/A 02/12/2021   Procedure: SAVORY DILATION;  Surgeon: Jeani Hawking, MD;  Location: [facility] ENDOSCOPY;  Service: Endoscopy;  Laterality: N/A;   SHOULDER SURGERY     TONSILLECTOMY     VAGINAL HYSTERECTOMY     Current Outpatient Medications on File Prior to Visit  Medication Sig Dispense Refill  acetaminophen (TYLENOL) 500 MG tablet Take 1 tablet (500 mg total) by mouth every 6 (six) hours as needed (pain). 30 tablet 0   acetaminophen (TYLENOL) 500 MG tablet Take 1 tablet (500 mg total) by mouth every 6 (six) hours as needed (pain). 30 tablet 0   ascorbic acid (VITAMIN C) 1000 MG tablet Take 1 tablet by mouth daily.     Blood Glucose Monitoring Suppl (FIFTY50 GLUCOSE METER 2.0) w/Device KIT OneTouch UltraMini kit  USE AS DIRECTED TO CHECK BLOOD SUGARS TWICE A DAY     carboxymethylcellulose (REFRESH PLUS) 0.5 % SOLN Place 1 drop into both eyes 3  (three) times daily as needed.     Cholecalciferol 125 MCG (5000 UT) TABS Take by mouth.     clonazePAM (KLONOPIN) 1 MG tablet Take 1 mg by mouth at bedtime.     conjugated estrogens (PREMARIN) vaginal cream See admin instructions.     Continuous Glucose Sensor (DEXCOM G6 SENSOR) MISC      dapagliflozin propanediol (FARXIGA) 5 MG TABS tablet Take 5 mg by mouth in the morning.     dorzolamide-timolol (COSOPT) 22.3-6.8 MG/ML ophthalmic solution Place 1 drop into both eyes 2 (two) times daily.     famotidine (PEPCID) 20 MG tablet Take 1 tablet (20 mg total) by mouth 2 (two) times daily. 10 tablet 0   insulin glargine (LANTUS) 100 UNIT/ML injection Inject 11 Units into the skin at bedtime.     Insulin Pen Needle 31G X 5 MM MISC USE TWICE A DAY     Lancets (ONETOUCH ULTRASOFT) lancets OneTouch UltraSoft Lancets     latanoprost (XALATAN) 0.005 % ophthalmic solution SMARTSIG:In Eye(s)     magic mouthwash (lidocaine, diphenhydrAMINE, alum & mag hydroxide) suspension Swish and swallow 10 mLs 3 (three) times daily. 80 mL 0   metFORMIN (GLUCOPHAGE) 1000 MG tablet Take 1,000 mg by mouth 2 (two) times daily with a meal.     ONETOUCH VERIO test strip 2 (two) times daily. use for testing     polyethylene glycol powder (GLYCOLAX/MIRALAX) 17 GM/SCOOP powder Take 17 g by mouth daily. Drink 17g (1 scoop) dissolved in water per day. 255 g 0   ramipril (ALTACE) 5 MG capsule Take 5 mg by mouth daily.     rosuvastatin (CRESTOR) 5 MG tablet Take 40 mg by mouth daily.     Semaglutide, 2 MG/DOSE, (OZEMPIC, 2 MG/DOSE,) 8 MG/3ML SOPN Inject into the skin.     TURMERIC PO Take 1 capsule by mouth in the morning.     valACYclovir (VALTREX) 1000 MG tablet Take 500 mg by mouth daily.     No current facility-administered medications on file prior to visit.    Allergies  Allergen Reactions   Clarithromycin Shortness Of Breath, Swelling, Anxiety and Other (See Comments)    Throat swelling Other Reaction: laryngeal  edema Other reaction(s): breathing issues, Other (See Comments) Throat swelling Other Reaction: laryngeal edema Other reaction(s): Other (See Comments) Throat swelling Other Reaction: laryngeal edema Throat swelling Throat swelling Other Reaction: laryngeal edema    Exenatide Anaphylaxis, Rash and Other (See Comments)     Caused Sweating, sores in the mouth  Also gum soreness/irritation  Other reaction(s): Other (See Comments) Also gum soreness/irritation Caused Sweating, sores in the mouth  Caused Sweating, sores in the mouth  Also gum soreness/irritation  Caused Sweating, sores in the mouth  Also gum soreness/irritation Caused Sweating, sores in the mouth    Ibuprofen Rash and Other (See Comments)  Nose bleed   Methocarbamol Anaphylaxis and Swelling    Swollen eyes   Shellfish Allergy Nausea And Vomiting   Ampicillin Nausea And Vomiting   Carafate [Sucralfate] Nausea And Vomiting   Cephalexin     Other reaction(s): breathing issues Other reaction(s): breathing issues   Cyanocobalamin    Glyburide     Other reaction(s): ?? Other reaction(s): ??   Metronidazole Nausea And Vomiting, Nausea Only and Swelling    Pt stated, "Eyes swelled up"   Prednisone     Other reaction(s): BS fluctuations Other reaction(s): BS fluctuations   Pregabalin     Other reaction(s): disoriented Other reaction(s): disoriented   Vitamin B12 Swelling   Ketoconazole Nausea And Vomiting and Palpitations    Other reaction(s): Irregular Heart Rate  Other reaction(s): Other (See Comments) Other reaction(s): Irregular Heart Rate Other reaction(s): Irregular Heart Rate   Mango Flavoring Agent (Non-Screening) Itching and Rash   Pineapple Rash   Strawberry Flavoring Agent (Non-Screening) Itching   Social History   Occupational History   Not on file  Tobacco Use   Smoking status: Former    Current packs/day: 0.00    Average packs/day: 0.5 packs/day for 26.0 years (13.0 ttl pk-yrs)     Types: Cigarettes    Start date: 65    Quit date: 16    Years since quitting: 27.2   Smokeless tobacco: Never  Vaping Use   Vaping status: Never Used  Substance and Sexual Activity   Alcohol use: Yes    Comment: occasionally   Drug use: No   Sexual activity: Not Currently   Family History  Problem Relation Age of Onset   Diabetes Mother    Lung cancer Father    Diabetes Brother    Immunization History  Administered Date(s) Administered   Influenza-Unspecified 08/14/2018   PFIZER(Purple Top)SARS-COV-2 Vaccination 01/13/2020, 02/04/2020     Review of Systems: Negative except as noted in the HPI.   Objective: There were no vitals filed for this visit.  Jessice Madill Zylstra is a pleasant 68 y.o. female in NAD. AAO X 3.  Diabetic foot exam was performed with the following findings:   Vascular Examination: Capillary refill time immediate b/l. Vascular status intact b/l with palpable pedal pulses. Pedal hair present b/l. No pain with calf compression b/l. Skin temperature gradient WNL b/l. No cyanosis or clubbing b/l. No ischemia or gangrene noted b/l.   Neurological Examination: Sensation grossly intact b/l with 10 gram monofilament. Pt has subjective symptoms of neuropathy.   Dermatological Examination: Pedal skin with normal turgor, texture and tone b/l.  No open wounds. No interdigital macerations.   Toenails 2-5 b/l thick, discolored, elongated with subungual debris and pain on dorsal palpation.   Anonychia noted bilateral great toes. Nailbed(s) epithelialized.  Hyperkeratotic lesion(s) submet head 1 b/l and submet head 5 b/l.  No erythema, no edema, no drainage, no fluctuance.  Musculoskeletal Examination: Normal muscle strength 5/5 to all lower extremity muscle groups bilaterally. No pain, crepitus or joint limitation noted with ROM b/l LE. No gross bony pedal deformities b/l. Patient ambulates independently without assistive aids.  Radiographs: None  ADA Risk  Categorization: High Risk  Patient has one or more of the following: Loss of protective sensation Absent pedal pulses Severe Foot deformity History of foot ulcer  Assessment: 1. Pain due to onychomycosis of toenails of both feet   2. Callus   3. Diabetic peripheral neuropathy associated with type 2 diabetes mellitus (HCC)     Plan: -Consent  given for treatment as described below: -Examined patient. -Diabetic foot examination performed today. -Continue supportive shoe gear daily. -Toenails were debrided in length and girth 2-5 bilaterally with sterile nail nippers and dremel without iatrogenic bleeding.  -Callus(es) submet head 1 b/l and submet head 5 b/l pared utilizing sterile scalpel blade without complication or incident. Total number debrided =4. -Patient/POA to call should there be question/concern in the interim. Return in about 3 months (around 04/18/2024).  Freddie Breech, DPM      [place] LOCATION: [address] [place] [address]                   Office [phone]   [facility] LOCATION: [address] [place], [place] [address] Office [phone]

## 2024-01-23 DIAGNOSIS — K5900 Constipation, unspecified: Secondary | ICD-10-CM | POA: Diagnosis not present

## 2024-01-23 DIAGNOSIS — K219 Gastro-esophageal reflux disease without esophagitis: Secondary | ICD-10-CM | POA: Diagnosis not present

## 2024-01-30 ENCOUNTER — Encounter (HOSPITAL_COMMUNITY): Payer: Self-pay | Admitting: Obstetrics and Gynecology

## 2024-01-30 NOTE — Progress Notes (Signed)
 Spoke w/ via phone for pre-op interview--- Bonnie [name] needs dos---- BMP, CBG and A1C per anesthesia. Surgeons orders requested 02/06/24.        [name] results------Current EKG 04/15/23. COVID test -----patient states asymptomatic no test needed Arrive at -------1000 NPO after MN NO Solid Food.  Clear liquids from MN until---0900 Pre-Surgery Ensure or G2:  Med rec completed Medications to take morning of surgery ----- Pepcid, eye gtts, Crestor and Valtrex. Diabetic medication ----- 1/2 insulin dose night before, no DM neds AM of surgery. Pt verbalized understanding.  GLP1 agonist last dose:Ozempic 01/25/24 GLP1 instructions:No more Ozempic doses until after surgery, pt verbalized understanding.  Patient instructed no nail polish to be worn day of surgery Patient instructed to bring photo id and insurance card day of surgery Patient aware to have Driver (ride ) / caregiver    for 24 hours after surgery - Husband Bonnie [name] Patient Special Instructions ----- SHower with antibacterial soap. Pre-Op special Instructions -----  Patient verbalized understanding of instructions that were given at this phone interview. Patient denies chest pain, sob, fever, cough at the interview.

## 2024-02-05 NOTE — Progress Notes (Signed)
 Spoke with pt via telephone, called with questions about what medications to take and hold day of surgery. Reviewed medication list with pt and which medications to take and which ones to hold day of surgery. Patient verbalized understanding.

## 2024-02-06 ENCOUNTER — Ambulatory Visit (HOSPITAL_COMMUNITY)
Admission: RE | Admit: 2024-02-06 | Discharge: 2024-02-06 | Disposition: A | Discharge status: Home / Self Care | Payer: Medicare Other | Attending: Obstetrics and Gynecology | Admitting: Obstetrics and Gynecology

## 2024-02-06 ENCOUNTER — Other Ambulatory Visit: Payer: Self-pay

## 2024-02-06 ENCOUNTER — Encounter (HOSPITAL_COMMUNITY): Payer: Self-pay | Admitting: Obstetrics and Gynecology

## 2024-02-06 ENCOUNTER — Encounter (HOSPITAL_COMMUNITY)
Admission: RE | Disposition: A | Discharge status: Home / Self Care | Payer: Self-pay | Source: Home / Self Care | Attending: Obstetrics and Gynecology

## 2024-02-06 ENCOUNTER — Ambulatory Visit (HOSPITAL_COMMUNITY)

## 2024-02-06 ENCOUNTER — Ambulatory Visit (HOSPITAL_BASED_OUTPATIENT_CLINIC_OR_DEPARTMENT_OTHER)

## 2024-02-06 DIAGNOSIS — Z79899 Other long term (current) drug therapy: Secondary | ICD-10-CM | POA: Insufficient documentation

## 2024-02-06 DIAGNOSIS — E119 Type 2 diabetes mellitus without complications: Secondary | ICD-10-CM | POA: Diagnosis not present

## 2024-02-06 DIAGNOSIS — N993 Prolapse of vaginal vault after hysterectomy: Secondary | ICD-10-CM | POA: Insufficient documentation

## 2024-02-06 DIAGNOSIS — Z7984 Long term (current) use of oral hypoglycemic drugs: Secondary | ICD-10-CM | POA: Diagnosis not present

## 2024-02-06 DIAGNOSIS — N393 Stress incontinence (female) (male): Secondary | ICD-10-CM | POA: Insufficient documentation

## 2024-02-06 DIAGNOSIS — F419 Anxiety disorder, unspecified: Secondary | ICD-10-CM | POA: Insufficient documentation

## 2024-02-06 DIAGNOSIS — Z794 Long term (current) use of insulin: Secondary | ICD-10-CM | POA: Diagnosis not present

## 2024-02-06 DIAGNOSIS — Z87891 Personal history of nicotine dependence: Secondary | ICD-10-CM | POA: Diagnosis not present

## 2024-02-06 DIAGNOSIS — Z833 Family history of diabetes mellitus: Secondary | ICD-10-CM | POA: Insufficient documentation

## 2024-02-06 DIAGNOSIS — Z79624 Long term (current) use of inhibitors of nucleotide synthesis: Secondary | ICD-10-CM | POA: Insufficient documentation

## 2024-02-06 DIAGNOSIS — E113293 Type 2 diabetes mellitus with mild nonproliferative diabetic retinopathy without macular edema, bilateral: Secondary | ICD-10-CM

## 2024-02-06 DIAGNOSIS — I10 Essential (primary) hypertension: Secondary | ICD-10-CM | POA: Insufficient documentation

## 2024-02-06 DIAGNOSIS — N8110 Cystocele, unspecified: Secondary | ICD-10-CM | POA: Diagnosis not present

## 2024-02-06 HISTORY — PX: CYSTOSCOPY: SHX5120

## 2024-02-06 HISTORY — PX: ANTERIOR AND POSTERIOR REPAIR WITH SACROSPINOUS FIXATION: SHX6536

## 2024-02-06 LAB — BASIC METABOLIC PANEL
Anion gap: 9 (ref 5–15)
BUN: 12 mg/dL (ref 8–23)
CO2: 22 mmol/L (ref 22–32)
Calcium: 9.6 mg/dL (ref 8.9–10.3)
Chloride: 110 mmol/L (ref 98–111)
Creatinine, Ser: 0.67 mg/dL (ref 0.44–1.00)
GFR, Estimated: >60 mL/min (ref >60)
Glucose, Bld: 116 mg/dL — ABNORMAL HIGH (ref 70–99)
Potassium: 4 mmol/L (ref 3.5–5.1)
Sodium: 141 mmol/L (ref 135–145)

## 2024-02-06 LAB — HEMOGLOBIN A1C
Hgb A1c MFr Bld: 7.2 % — ABNORMAL HIGH (ref 4.8–5.6)
Mean Plasma Glucose: 159.94 mg/dL

## 2024-02-06 LAB — GLUCOSE, CAPILLARY
Glucose-Capillary: 95 mg/dL (ref 70–99)
Glucose-Capillary: 162 mg/dL — ABNORMAL HIGH (ref 70–99)

## 2024-02-06 SURGERY — ANTERIOR AND POSTERIOR REPAIR WITH SACROSPINOUS FIXATION
Anesthesia: General | Site: Vagina

## 2024-02-06 MED ORDER — LACTATED RINGERS IV SOLN: INTRAVENOUS | Status: DC

## 2024-02-06 MED ORDER — ONDANSETRON HCL 4 MG/2ML IJ SOLN
INTRAMUSCULAR | Status: AC
  Filled 2024-02-06: qty 2

## 2024-02-06 MED ORDER — INSULIN ASPART 100 UNIT/ML IJ SOLN: 0.0000 [IU] | INTRAMUSCULAR | Status: DC | PRN

## 2024-02-06 MED ORDER — FENTANYL CITRATE (PF) 100 MCG/2ML IJ SOLN: 25.0000 ug | INTRAMUSCULAR | Status: DC | PRN

## 2024-02-06 MED ORDER — DROPERIDOL 2.5 MG/ML IJ SOLN: 0.6250 mg | Freq: Once | INTRAMUSCULAR | Status: DC | PRN

## 2024-02-06 MED ORDER — ACETAMINOPHEN 500 MG PO TABS
1000.0000 mg | ORAL_TABLET | ORAL | Status: AC
  Administered 2024-02-06: 1000 mg via ORAL

## 2024-02-06 MED ORDER — STERILE WATER FOR IRRIGATION IR SOLN
Status: DC | PRN
  Administered 2024-02-06: 1000 mL

## 2024-02-06 MED ORDER — DEXAMETHASONE SODIUM PHOSPHATE 10 MG/ML IJ SOLN
INTRAMUSCULAR | Status: DC | PRN
  Administered 2024-02-06: 5 mg via INTRAVENOUS

## 2024-02-06 MED ORDER — ORAL CARE MOUTH RINSE: 15.0000 mL | Freq: Once | OROMUCOSAL | Status: AC

## 2024-02-06 MED ORDER — LIDOCAINE HCL (CARDIAC) PF 100 MG/5ML IV SOSY
PREFILLED_SYRINGE | INTRAVENOUS | Status: DC | PRN
  Administered 2024-02-06: 60 mg via INTRAVENOUS

## 2024-02-06 MED ORDER — CHLORHEXIDINE GLUCONATE 0.12 % MT SOLN
OROMUCOSAL | Status: AC
  Filled 2024-02-06: qty 15

## 2024-02-06 MED ORDER — CIPROFLOXACIN IN D5W 400 MG/200ML IV SOLN
400.0000 mg | INTRAVENOUS | Status: AC
  Administered 2024-02-06: 400 mg via INTRAVENOUS

## 2024-02-06 MED ORDER — FENTANYL CITRATE (PF) 100 MCG/2ML IJ SOLN
INTRAMUSCULAR | Status: DC | PRN
Start: 2024-02-06 — End: 2024-02-06
  Administered 2024-02-06: 50 ug via INTRAVENOUS

## 2024-02-06 MED ORDER — EPHEDRINE SULFATE-NACL 50-0.9 MG/10ML-% IV SOSY
PREFILLED_SYRINGE | INTRAVENOUS | Status: DC | PRN
  Administered 2024-02-06: 5 mg via INTRAVENOUS
  Administered 2024-02-06 (×2): 2.5 mg via INTRAVENOUS

## 2024-02-06 MED ORDER — PROPOFOL 10 MG/ML IV BOLUS
INTRAVENOUS | Status: DC | PRN
  Administered 2024-02-06: 130 mg via INTRAVENOUS

## 2024-02-06 MED ORDER — ACETAMINOPHEN 10 MG/ML IV SOLN: 1000.0000 mg | Freq: Once | INTRAVENOUS | Status: DC | PRN

## 2024-02-06 MED ORDER — GABAPENTIN 300 MG PO CAPS
ORAL_CAPSULE | ORAL | Status: AC
  Filled 2024-02-06: qty 1

## 2024-02-06 MED ORDER — MIDAZOLAM HCL 5 MG/5ML IJ SOLN
INTRAMUSCULAR | Status: DC | PRN
  Administered 2024-02-06: 2 mg via INTRAVENOUS

## 2024-02-06 MED ORDER — LIDOCAINE 2% (20 MG/ML) 5 ML SYRINGE
INTRAMUSCULAR | Status: AC
  Filled 2024-02-06: qty 5

## 2024-02-06 MED ORDER — DEXAMETHASONE SODIUM PHOSPHATE 10 MG/ML IJ SOLN
INTRAMUSCULAR | Status: AC
Start: 2024-02-06 — End: ?
  Filled 2024-02-06: qty 1

## 2024-02-06 MED ORDER — CIPROFLOXACIN IN D5W 400 MG/200ML IV SOLN
INTRAVENOUS | Status: AC
Start: 2024-02-06 — End: 2024-02-06
  Filled 2024-02-06: qty 200

## 2024-02-06 MED ORDER — ONDANSETRON HCL 4 MG/2ML IJ SOLN
INTRAMUSCULAR | Status: DC | PRN
  Administered 2024-02-06: 4 mg via INTRAVENOUS

## 2024-02-06 MED ORDER — OXYCODONE HCL 5 MG PO TABS: 5.0000 mg | ORAL_TABLET | Freq: Once | ORAL | Status: DC | PRN

## 2024-02-06 MED ORDER — FENTANYL CITRATE (PF) 250 MCG/5ML IJ SOLN
INTRAMUSCULAR | Status: AC
  Filled 2024-02-06: qty 5

## 2024-02-06 MED ORDER — EPHEDRINE 5 MG/ML INJ
INTRAVENOUS | Status: AC
  Filled 2024-02-06: qty 5

## 2024-02-06 MED ORDER — PHENAZOPYRIDINE HCL 100 MG PO TABS
200.0000 mg | ORAL_TABLET | ORAL | Status: AC
  Administered 2024-02-06: 200 mg via ORAL

## 2024-02-06 MED ORDER — MIDAZOLAM HCL 2 MG/2ML IJ SOLN
INTRAMUSCULAR | Status: AC
  Filled 2024-02-06: qty 2

## 2024-02-06 MED ORDER — CLINDAMYCIN PHOSPHATE 900 MG/50ML IV SOLN
900.0000 mg | INTRAVENOUS | Status: AC
  Administered 2024-02-06: 900 mg via INTRAVENOUS
  Filled 2024-02-06: qty 50

## 2024-02-06 MED ORDER — PHENAZOPYRIDINE HCL 100 MG PO TABS
ORAL_TABLET | ORAL | Status: AC
  Filled 2024-02-06: qty 2

## 2024-02-06 MED ORDER — ACETAMINOPHEN 500 MG PO TABS
ORAL_TABLET | ORAL | Status: AC
  Filled 2024-02-06: qty 2

## 2024-02-06 MED ORDER — GABAPENTIN 300 MG PO CAPS
300.0000 mg | ORAL_CAPSULE | ORAL | Status: AC
  Administered 2024-02-06: 300 mg via ORAL

## 2024-02-06 MED ORDER — OXYCODONE HCL 5 MG/5ML PO SOLN: 5.0000 mg | Freq: Once | ORAL | Status: DC | PRN

## 2024-02-06 MED ORDER — LIDOCAINE-EPINEPHRINE 1 %-1:100000 IJ SOLN
INTRAMUSCULAR | Status: DC | PRN
  Administered 2024-02-06: 30 mL

## 2024-02-06 MED ORDER — PROPOFOL 10 MG/ML IV BOLUS
INTRAVENOUS | Status: AC
  Filled 2024-02-06: qty 20

## 2024-02-06 MED ORDER — SODIUM CHLORIDE 0.9 % IR SOLN
Status: DC | PRN
  Administered 2024-02-06: 1000 mL via INTRAVESICAL

## 2024-02-06 MED ORDER — CHLORHEXIDINE GLUCONATE 0.12 % MT SOLN
15.0000 mL | Freq: Once | OROMUCOSAL | Status: AC
  Administered 2024-02-06: 15 mL via OROMUCOSAL

## 2024-02-06 SURGICAL SUPPLY — 37 items
BAG DRAIN URO-CYSTO SKYTR STRL (DRAIN) IMPLANT
BLADE SURG 15 STRL LF DISP TIS (BLADE) ×3 IMPLANT
CATH FOLEY 2WAY SLVR 5CC 12FR (CATHETERS) ×3 IMPLANT
CATH ROBINSON RED A/P 12FR (CATHETERS) IMPLANT
DEVICE CAPIO SLIM SINGLE (INSTRUMENTS) ×3 IMPLANT
DRAPE HYSTEROSCOPY (MISCELLANEOUS) ×3 IMPLANT
ELECT REM PT RETURN 9FT ADLT (ELECTROSURGICAL) IMPLANT
ELECTRODE REM PT RTRN 9FT ADLT (ELECTROSURGICAL) IMPLANT
GLOVE BIOGEL PI IND STRL 6.5 (GLOVE) ×3 IMPLANT
GLOVE ECLIPSE 6.0 STRL STRAW (GLOVE) ×3 IMPLANT
GLOVE INDICATOR 7.5 STRL GRN (GLOVE) IMPLANT
GOWN STRL REUS W/ TWL LRG LVL3 (GOWN DISPOSABLE) ×3 IMPLANT
GOWN STRL REUS W/TWL LRG LVL3 (GOWN DISPOSABLE) ×3 IMPLANT
HIBICLENS CHG 4% 4OZ BTL (MISCELLANEOUS) ×3 IMPLANT
KIT TURNOVER KIT B (KITS) ×3 IMPLANT
MANIFOLD NEPTUNE II (INSTRUMENTS) ×3 IMPLANT
NDL HYPO 22X1.5 SAFETY MO (MISCELLANEOUS) ×3 IMPLANT
NDL MAYO 6 CRC TAPER PT (NEEDLE) ×3 IMPLANT
NEEDLE HYPO 22X1.5 SAFETY MO (MISCELLANEOUS) ×3 IMPLANT
NEEDLE MAYO 6 CRC TAPER PT (NEEDLE) ×3 IMPLANT
NS IRRIG 1000ML POUR BTL (IV SOLUTION) ×3 IMPLANT
PACK CYSTO (CUSTOM PROCEDURE TRAY) ×3 IMPLANT
PACK VAGINAL WOMENS (CUSTOM PROCEDURE TRAY) ×3 IMPLANT
RETRACTOR LONE STAR DISPOSABLE (INSTRUMENTS) ×3 IMPLANT
RETRACTOR STAY HOOK 5MM (MISCELLANEOUS) ×3 IMPLANT
SET CYSTO W/LG BORE CLAMP LF (SET/KITS/TRAYS/PACK) ×3 IMPLANT
SLEEVE SCD COMPRESS KNEE MED (STOCKING) ×3 IMPLANT
SPIKE FLUID TRANSFER (MISCELLANEOUS) IMPLANT
SURGIFLO W/THROMBIN 8M KIT (HEMOSTASIS) IMPLANT
SUT ABS MONO DBL WITH NDL 48IN (SUTURE) ×6 IMPLANT
SUT VIC AB 2-0 SH 27XBRD (SUTURE) ×3 IMPLANT
SUT VIC AB 3-0 SH 18 (SUTURE) IMPLANT
SYR BULB EAR ULCER 3OZ GRN STR (SYRINGE) ×3 IMPLANT
SYSTEM URETHRAL BULK BULKAMID (Female Continence) IMPLANT
TOWEL GREEN STERILE (TOWEL DISPOSABLE) ×3 IMPLANT
TRAY FOLEY W/BAG SLVR 14FR LF (SET/KITS/TRAYS/PACK) ×3 IMPLANT
TUBE CONNECTING 12X1/4 (SUCTIONS) IMPLANT

## 2024-02-06 NOTE — Discharge Instructions (Addendum)
 Do Not Take Tylenol until after 5pm today.    POST OPERATIVE INSTRUCTIONS  General Instructions Recovery (not bed rest) will last approximately 6 weeks Walking is encouraged, but refrain from strenuous exercise/ housework/ heavy lifting. No lifting >10lbs  Nothing in the vagina- NO intercourse, tampons or douching Bathing:  Do not submerge in water (NO swimming, bath, hot tub, etc) until after your postop visit. You can shower starting the day after surgery.  No driving until you are not taking narcotic pain medicine and until your pain is well enough controlled that you can slam on the breaks or make sudden movements if needed.   Taking your medications Please take your acetaminophen and ibuprofen on a schedule for the first 48 hours. Take 600mg  ibuprofen, then take 500mg  acetaminophen 3 hours later, then continue to alternate ibuprofen and acetaminophen. That way you are taking each type of medication every 6 hours. Take the prescribed narcotic (oxycodone, tramadol, etc) as needed, with a maximum being every 4 hours.  Take a stool softener daily to keep your stools soft and preventing you from straining. If you have diarrhea, you decrease your stool softener. This is explained more below. We have prescribed you Miralax.  Reasons to Call the Nurse (see last page for phone numbers) Heavy Bleeding (changing your pad every 1-2 hours) Persistent nausea/vomiting Fever (100.4 degrees or more) Incision problems (pus or other fluid coming out, redness, warmth, increased pain)  Things to Expect After Surgery Mild to Moderate pain is normal during the first day or two after surgery. If prescribed, take Ibuprofen or Tylenol first and use the stronger medicine for "break-through" pain. You can overlap these medicines because they work differently.   Constipation   To Prevent Constipation:  Eat a well-balanced diet including protein, grains, fresh fruit and vegetables.  Drink plenty of fluids.  Walk regularly.  Depending on specific instructions from your physician: take Miralax daily and additionally you can add a stool softener (colace/ docusate) and fiber supplement. Continue as [place] as you're on pain medications.   To Treat Constipation:  If you do not have a bowel movement in 2 days after surgery, you can take 2 Tbs of Milk of Magnesia 1-2 times a day until you have a bowel movement. If diarrhea occurs, decrease the amount or stop the laxative. If no results with Milk of Magnesia, you can drink a bottle of magnesium citrate which you can purchase over the counter.  Fatigue:  This is a normal response to surgery and will improve with time.  Plan frequent rest periods throughout the day.  Gas Pain:  This is very common but can also be very painful! Drink warm liquids such as herbal teas, bouillon or soup. Walking will help you pass more gas.  Mylicon or Gas-X can be taken over the counter.  Leaking Urine:  Varying amounts of leakage may occur after surgery.  This should improve with time. Your bladder needs at least 3 months to recover from surgery. If you leak after surgery, be sure to mention this to your doctor at your post-op visit. If you were taking medications for overactive bladder prior to surgery, be sure to restart the medications immediately after surgery.  Incisions: If you have incisions on your abdomen, the skin glue will dissolve on its own over time. It is ok to gently rinse with soap and water over these incisions but do not scrub.  Catheter Approximately 50% of patients are unable to urinate after surgery and need  to go home with a catheter. This allows your bladder to rest so it can return to full function. If you go home with a catheter, the office will call to set up a voiding trial a few days after surgery. For most patients, by this visit, they are able to urinate on their own. [place] term catheter use is rare.   Return to Work  As work demands and recovery times  vary widely, it is hard to predict when you will want to return to work. If you have a desk job with no strenuous physical activity, and if you would like to return sooner than generally recommended, discuss this with your provider or call our office.   Post op concerns  For non-emergent issues, please call the Urogynecology Nurse. Please leave a message and someone will contact you within one business day.  You can also send a message through MyChart.   AFTER HOURS (After 5:00 PM and on weekends):  For urgent matters that cannot wait until the next business day. Call our office [phone] and connect to the doctor on call.  Please reserve this for important issues.   **FOR ANY TRUE EMERGENCY ISSUES CALL 911 OR GO TO THE NEAREST EMERGENCY ROOM.** Please inform our office or the doctor on call of any emergency.     APPOINTMENTS: Call [phone]  Post Anesthesia Home Care Instructions  Activity: Get plenty of rest for the remainder of the day. A responsible adult should stay with you for 24 hours following the procedure.  For the next 24 hours, DO NOT: -Drive a car -Advertising copywriter -Drink alcoholic beverages -Take any medication unless instructed by your physician -Make any legal decisions or sign important papers.  Meals: Start with liquid foods such as gelatin or soup. Progress to regular foods as tolerated. Avoid greasy, spicy, heavy foods. If nausea and/or vomiting occur, drink only clear liquids until the nausea and/or vomiting subsides. Call your physician if vomiting continues.  Special Instructions/Symptoms: Your throat may feel dry or sore from the anesthesia or the breathing tube placed in your throat during surgery. If this causes discomfort, gargle with warm salt water. The discomfort should disappear within 24 hours.   Call your surgeon if you experience:   1.  Fever over 101.0. 2.  Inability to urinate. 3.  Nausea and/or vomiting. 4.  Extreme swelling or  bruising at the surgical site. 5.  Continued bleeding from the incision. 6.  Increased pain, redness or drainage from the incision. 7.  Problems related to your pain medication. 8. Any change in color, movement and/or sensation 9. Any problems and/or concerns

## 2024-02-06 NOTE — Anesthesia Postprocedure Evaluation (Signed)
 Anesthesia Post Note  Patient: Bonnie [name]  Procedure(s) Performed: ANTERIOR AND POSTERIOR REPAIR WITH PERINEORRHAPHY AND SACROSPINOUS FIXATION (Vagina ) URETHRAL BULKING w/ bulkamid (Urethra) CYSTOSCOPY (Bladder)     Patient location during evaluation: PACU Anesthesia Type: General Level of consciousness: awake and alert Pain management: pain level controlled Vital Signs Assessment: post-procedure vital signs reviewed and stable Respiratory status: spontaneous breathing, nonlabored ventilation, respiratory function stable and patient connected to nasal cannula oxygen Cardiovascular status: blood pressure returned to baseline and stable Postop Assessment: no apparent nausea or vomiting Anesthetic complications: no   No notable events documented.  Last Vitals:  Vitals:   02/06/24 1051 02/06/24 1345  BP: (!) 145/72 124/62  Pulse: 78 (!) 58  Resp: 17 11  Temp: 36.7 C   SpO2: 99% 96%    Last Pain:  Vitals:   02/06/24 1345  TempSrc:   PainSc: Asleep                 [place]

## 2024-02-06 NOTE — Anesthesia Procedure Notes (Signed)
 Procedure Name: LMA Insertion Date/Time: 02/06/2024 12:01 PM  Performed by: Roosvelt Harps, CRNAPre-anesthesia Checklist: Patient identified, Emergency Drugs available, Suction available and Patient being monitored Patient Re-evaluated:Patient Re-evaluated prior to induction Oxygen Delivery Method: Circle System Utilized Preoxygenation: Pre-oxygenation with 100% oxygen Induction Type: IV induction Ventilation: Mask ventilation without difficulty LMA: LMA inserted LMA Size: 4.0 Number of attempts: 1 Airway Equipment and Method: Bite block Placement Confirmation: positive ETCO2 Tube secured with: Tape Dental Injury: Teeth and Oropharynx as per pre-operative assessment

## 2024-02-06 NOTE — Anesthesia Preprocedure Evaluation (Addendum)
 Anesthesia Evaluation  Patient identified by MRN, date of birth, ID band Patient awake    Reviewed: Allergy & Precautions, H&P , NPO status , Patient's Chart, lab work & pertinent test results  History of Anesthesia Complications Negative for: history of anesthetic complications  Airway Mallampati: II  TM Distance: >3 FB Neck ROM: Full    Dental no notable dental hx. (+) Lower Dentures, Upper Dentures   Pulmonary former smoker   Pulmonary exam normal breath sounds clear to auscultation       Cardiovascular hypertension, Normal cardiovascular exam Rhythm:Regular Rate:Normal     Neuro/Psych  PSYCHIATRIC DISORDERS Anxiety     negative neurological ROS     GI/Hepatic Neg liver ROS,GERD  ,,  Endo/Other  diabetes, Type 2    Renal/GU Renal disease    prolapse of vaginal vault after hysterectomy    Musculoskeletal  (+) Arthritis ,    Abdominal   Peds negative pediatric ROS (+)  Hematology  (+) Blood dyscrasia, anemia   Anesthesia Other Findings   Reproductive/Obstetrics negative OB ROS                             Anesthesia Physical Anesthesia Plan  ASA: 3  Anesthesia Plan: General   Post-op Pain Management: Tylenol PO (pre-op)*   Induction: Intravenous  PONV Risk Score and Plan: 3 and Ondansetron, Dexamethasone and Treatment may vary due to age or medical condition  Airway Management Planned: LMA  Additional Equipment: None  Intra-op Plan:   Post-operative Plan: Extubation in OR  Informed Consent: I have reviewed the patients History and Physical, chart, labs and discussed the procedure including the risks, benefits and alternatives for the proposed anesthesia with the patient or authorized representative who has indicated his/her understanding and acceptance.     Dental advisory given  Plan Discussed with: CRNA  Anesthesia Plan Comments:         Anesthesia Quick  Evaluation

## 2024-02-06 NOTE — Transfer of Care (Signed)
 Immediate Anesthesia Transfer of Care Note  Patient: Bonnie [name]  Procedure(s) Performed: ANTERIOR AND POSTERIOR REPAIR WITH PERINEORRHAPHY AND SACROSPINOUS FIXATION (Vagina ) URETHRAL BULKING w/ bulkamid (Urethra) CYSTOSCOPY (Bladder)  Patient Location: PACU  Anesthesia Type:General  Level of Consciousness: drowsy  Airway & Oxygen Therapy: Patient Spontanous Breathing and Patient connected to face mask oxygen  Post-op Assessment: Report given to RN and Post -op Vital signs reviewed and stable  Post vital signs: Reviewed and stable  Last Vitals:  Vitals Value Taken Time  BP 124/62 02/06/24 1345  Temp    Pulse 57 02/06/24 1346  Resp 14 02/06/24 1346  SpO2 97 % 02/06/24 1346  Vitals shown include unfiled device data.  Last Pain:  Vitals:   02/06/24 1051  TempSrc: Oral  PainSc: 0-No pain      Patients Stated Pain Goal: 3 (02/06/24 1051)  Complications: No notable events documented.

## 2024-02-06 NOTE — Op Note (Addendum)
 Operative Note  Preoperative Diagnosis: anterior vaginal prolapse, posterior vaginal prolapse, vaginal vault prolapse after hysterectomy, and stress urinary incontinence  Postoperative Diagnosis: same  Procedures performed:  Anterior and posterior repair with perineorrhaphy, sacrospinous ligament fixation, cystoscopy, urethral bulking (Bulkamid)   Implants: Bulkamid  Attending Surgeon: Lanetta Inch, MD   Anesthesia: General LMA  Findings: 1. On vaginal exam, stage II prolapse present  2. On cystoscopy, normal bladder and urethral mucosa without injury or lesion. Brisk bilateral ureteral efflux present.    Specimens: none  Estimated blood loss: 75 mL  IV fluids: 500 mL  Urine output: 120 mL  Complications: none  Procedure in Detail:  After informed consent was obtained, the patient was taken to the operating room where general anesthesia was induced. She was placed in dorsal lithotomy position, taking care to avoid any traction of the extremities and prepped and draped in the usual sterile fashion. A self-retaining lonestar retractor was placed using four elastic blue stays.  After a foley catheter was inserted into the urethra, the location of the midurethra was palpated. Two Allis clamps were along the anterior vaginal wall defect. 1% lidocaine with epinephrine was injected into the vaginal mucosa.  A vertical incision was made between these two Allis clamps with a 15 blade scalpel.  Allis clamps were placed along this incision and Metzenbaum scissors were used to undermine the vaginal mucosa along the incision.  The vaginal mucosa was then sharply dissected off to the vesicovaginal septum bilaterally to the level of the pubic rami. Two permanent sutures were encountered that were integrated into the septum so were not removed.  Anterior plication of the vesicovaginal septum was then performed using plicating sutures of 2-0 Vicryl. The vaginal mucosal edges were trimmed and the  incision reapproximated with 2-0 Vicryl in a running fashion.    The Foley catheter was removed.  A 70-degree cystoscope was introduced, and 360-degree inspection revealed no injury, lesion or foreign body in the bladder.  Good bilateral ureteral efflux was noted.  The bladder was drained and the cystoscope was removed.  The Foley catheter was reinserted.  Attention was then turned to the posterior vagina.  Two Allis clamps were along the posterior vaginal wall defect and at the perineum. 1% lidocaine with epinephrine was injected into the vaginal mucosa.  A vertical incision was made between these two Allis clamps with a 15 blade scalpel and diamond shape was made over the perineum. Excess perineal skin was removed.  Allis clamps were placed along this incision and Metzenbaum scissors were used to undermine the vaginal mucosa along the incision.  The vaginal mucosa was then sharply dissected off to the septum bilaterally.    For the sacrospinous ligament fixation (SSLF), the ischial spine was accessed on the right side via dissection with scissors and blunt dissection.  The sacrospinous ligament was palpated. Two 0 PDS suture was then placed at the sacrospinous ligament two fingerbreadths medial to the ischial spine, in order to avoid the pudendal neurovascular bundle, using a Capio needle driver.  The PDS suture was attached to the vaginal epithelium on the ipsilateral side of the vaginal apex and held. Posterior plication of the rectovaginal septum was then performed using mattress sutures of 2-0 Vicryl. The last distal stitch incorporated the perineal body in a U stitch fashion. The vaginal mucosal edges were closed with 2-0 Vicryl in a running fashion. The SSLF suture was then tied down with excellent support of the posterior and apical vagina. The perineal body  was then reapproximated with two interrupted 0-vicryl sutures. The perineal skin was then closed with a 2-0 vicryl in a subcutaneous and  subcuticular fashion. Irrigation was performed. Two additional figure of eight sutures were placed on the anterior vaginal wall and good hemostasis was noted.   Bulkamid was performed next.  A 0 degree Bulkamid cystoscope was inserted into the urethra into the bladder.  The needle was primed.  The cystoscope was inserted to the level of the bladder neck.  The needle was inserted 2 cm and the scope was pulled back into the urethra 2 cm.  The needle was inserted bevel up at the 5 o'clock position and the Bulkamid was injected to obtain coaptation.  This was repeated at the 2 o'clock,  10 o'clock and 7 o'clock positions.   A total of 2- 1ml syringes were used and good circumferential coaptation was noted. A 62F foley catheter was placed.  The patient tolerated the procedure well was taken to recovery room in stable condition all counts were correct x2.  Marguerita Beards, MD

## 2024-02-06 NOTE — Progress Notes (Signed)
 Patient did not pass the void challenge. Attempted bathroom x 2. Foley catheter inserted.  Emptied approx. . Tolerated well. MD made aware.

## 2024-02-06 NOTE — Interval H&P Note (Signed)
 History and Physical Interval Note:  02/06/2024 11:10 AM  Bonnie [name]  has presented today for surgery, with the diagnosis of prolapse of vaginal vault after hysterectomy; stress urinary incontinence.  The various methods of treatment have been discussed with the patient and family. After consideration of risks, benefits and other options for treatment, the patient has consented to  Procedure(s) with comments: ANTERIOR AND POSTERIOR REPAIR WITH PERINEORRHAPHY AND SACROSPINOUS FIXATION (N/A) URETHRAL BULKING w/ bulkamid (N/A) CYSTOSCOPY (N/A) as a surgical intervention.  The patient's history has been reviewed, patient examined, no change in status, stable for surgery.  I have reviewed the patient's chart and labs.  Questions were answered to the patient's satisfaction.     Marguerita Beards

## 2024-02-07 ENCOUNTER — Encounter (HOSPITAL_COMMUNITY): Payer: Self-pay | Admitting: Obstetrics and Gynecology

## 2024-02-07 ENCOUNTER — Telehealth: Payer: Self-pay | Admitting: Obstetrics and Gynecology

## 2024-02-07 NOTE — Telephone Encounter (Signed)
 [place]  underwent Anterior And Posterior Repair With Perineorrhaphy And Sacrospinous Fixation, URETHRAL BULKING w/ bulkamid, and Cystoscopy  on 02/06/2024  with [x] Dr Florian Buff [] Dr Olena Leatherwood.  The patient reports that her pain is controlled.  She is taking [] No Medication [x] Acetaminophen 500mg  every 6 hours [] Ibuprofen 600mg  every 6 hours or [x]  Prescribed Narcotic.  Her pain level is 3[] with medication [] Without medication is.   She minimal vaginal bleeding. Pt said she has rectal bleeding through the night then a little vaginal bleeding in the morning  The patient is tolerating PO fluids and solids. She has not had a bowel movement and is taking Miralax for a bowel regimen. She is not passing gas.  She was discharged with a catheter.   []  Discharged without a catheter, the patient does feel as if she is emptying her bladder.  [] Discharged with a catheter, the patient is not having any concerns with her catheter.  She will return for a voiding trial. [] Verified scheduled date and time with patient.  She does not having any additional questions.  Reviewed Post operative instructions as needed to answer additional questions.   CC'd note to patient's provider.

## 2024-02-07 NOTE — Telephone Encounter (Signed)
 Don Perking Bramer underwent Anterior and posterior repair with perineorrhaphy, sacrospinous ligament fixation, cystoscopy, urethral bulking (Bulkamid) on 02/06/24.   She failed her voiding trial.  was backfilled into the bladder Pt was unable to void  She was discharged with a catheter. Please call her for a routine post op check and to schedule a voiding trial by [facility] 3/27. Thanks!  Marguerita Beards, MD

## 2024-02-07 NOTE — Telephone Encounter (Signed)
 Nurse visit scheduled for 3/27 @ 9:30

## 2024-02-08 ENCOUNTER — Ambulatory Visit

## 2024-02-08 NOTE — Progress Notes (Signed)
 Bonnie [name] is a 68 y.o. female  here for a voiding trial.  cath removed with no complications.  Instilled: 220 Voided: 250 PVR: 7voi

## 2024-02-08 NOTE — Patient Instructions (Signed)
 Please keep all future appointments and if you have any questions or concerns please feel free to contact our office at [phone].

## 2024-02-12 ENCOUNTER — Telehealth: Payer: Self-pay

## 2024-02-12 NOTE — Telephone Encounter (Signed)
 Patient called with complaints of aching type pains, that is lower to the rectum. She is still using her Tramadol and Tylenol and this doesn't seem to be helping much. She also is concerned her pain medication will run out soon. She denies any abdominal pain, nausea, vomiting, no fever and no trouble urinating or having BMs. She has been given a follow up to discuss and have an exam.

## 2024-02-14 ENCOUNTER — Ambulatory Visit: Admitting: Obstetrics and Gynecology

## 2024-02-14 ENCOUNTER — Encounter: Payer: Self-pay | Admitting: Obstetrics and Gynecology

## 2024-02-14 VITALS — BP 96/64 | HR 89

## 2024-02-14 DIAGNOSIS — G8918 Other acute postprocedural pain: Secondary | ICD-10-CM

## 2024-02-14 DIAGNOSIS — Z48816 Encounter for surgical aftercare following surgery on the genitourinary system: Secondary | ICD-10-CM

## 2024-02-14 MED ORDER — TRAMADOL HCL 50 MG PO TABS: 50.0000 mg | ORAL_TABLET | Freq: Four times a day (QID) | ORAL | 0 refills | Status: AC | PRN

## 2024-02-14 MED ORDER — GABAPENTIN 100 MG PO CAPS: 100.0000 mg | ORAL_CAPSULE | Freq: Three times a day (TID) | ORAL | 0 refills | Status: DC

## 2024-02-14 NOTE — Progress Notes (Signed)
 [facility]  Date of Visit: 02/14/2024  History of Present Illness: Ms. Huneycutt is a 68 y.o. female scheduled today for a post-operative visit.   Surgery: s/p Anterior and posterior repair with perineorrhaphy, sacrospinous ligament fixation, cystoscopy, urethral bulking (Bulkamid) on 02/05/21  She did not pass her postoperative void trial. She returned on 02/08/24 and passed her voiding trial.   She is having some irritation on the right buttock area. She has been taking the tylenol and tramadol and has been helping some but still irritating. Denies nausea and vomiting but has not had an appetite. Also has tenderness at the vaginal opening. Started using vaginal estrogen cream yesterday.   She is emptying her bladder but urine stream is slower, and has some trickle out when she stands up.   Medications: She has a current medication list which includes the following prescription(s): acetaminophen, ascorbic acid, fifty50 glucose meter 2.0, carboxymethylcellulose, cetirizine, vitamin d3, clonazepam, conjugated estrogens, dexcom g6 sensor, dapagliflozin propanediol, dexlansoprazole, famotidine, ferrous sulfate, gabapentin, insulin glargine, insulin pen needle, onetouch ultrasoft, latanoprost, metformin, onetouch verio, polyethylene glycol powder, ramipril, rosuvastatin, ozempic (0.25 or 0.5 mg/dose), timolol, tramadol, turmeric, valacyclovir, and zinc gluconate.   Allergies: Patient is allergic to biaxin [clarithromycin], byetta 10 mcg pen [exenatide], ibuprofen, robaxin [methocarbamol], shellfish allergy, ampicillin, carafate [sucralfate], cephalexin, covid-19 (adenovirus) vaccine, cyanocobalamin, flagyl [metronidazole], glyburide, lyrica [pregabalin], prednisone, vitamin b12, ketoconazole, mango flavoring agent (non-screening), pineapple, and strawberry flavoring agent (non-screening).   Physical Exam: BP 96/64   Pulse 89    Tenderness on palpation of right buttock Pelvic Examination:  Perineal incision with slight separation, no erythema or discharge present. Vagina: Incisions healing well. Sutures are present at incision line and there is a small amount of dark blood present  --------  Assessment and Plan:  1. Post-op pain     - Continue with tylenol and tramadol. Refill of #10 of tramadol provided. Will also start gabapentin 100mg  TID.  - Use vaginal estrogen cream at the perineum and internally nightly. Can decrease to twice a week after two weeks - Will monitor SUI symptoms and reassess next visit  Return 1 month or sooner if needed  Marguerita Beards, MD

## 2024-02-16 ENCOUNTER — Telehealth: Payer: Self-pay

## 2024-02-16 NOTE — Telephone Encounter (Signed)
 Bonnie [name] is a 68 y.o. female called in because she said the gabapentin is preventing her from sleeping. Pt said she uses Tylenol and tramdol. Pt said she is will stop the gabapentin until her next post op.

## 2024-02-21 ENCOUNTER — Encounter: Payer: Self-pay | Admitting: Obstetrics and Gynecology

## 2024-03-26 ENCOUNTER — Ambulatory Visit: Payer: Medicare Other | Admitting: Podiatry

## 2024-03-27 ENCOUNTER — Ambulatory Visit: Payer: Medicare Other | Admitting: Obstetrics and Gynecology

## 2024-03-27 ENCOUNTER — Encounter: Payer: Self-pay | Admitting: Obstetrics and Gynecology

## 2024-03-27 VITALS — BP 99/65 | HR 69

## 2024-03-27 DIAGNOSIS — Z48816 Encounter for surgical aftercare following surgery on the genitourinary system: Secondary | ICD-10-CM

## 2024-03-27 DIAGNOSIS — Z9889 Other specified postprocedural states: Secondary | ICD-10-CM

## 2024-03-27 NOTE — Progress Notes (Signed)
 [facility]  Date of Visit: 03/27/2024  History of Present Illness: Ms. Dobberstein is a 68 y.o. female scheduled today for a post-operative visit.    Surgery: s/p Anterior and posterior repair with perineorrhaphy, sacrospinous ligament fixation, cystoscopy, urethral bulking (Bulkamid) on 02/05/21  She did not pass her postoperative void trial. She returned on 02/08/24 and passed her voiding trial.   Postoperative course complicated by buttock pain- prescribed gabapentin .   Today she reports symptoms have improved. She has been using the premarin cream on the perineum. She has not been using the gabapentin . She still has some soreness in the buttock area but has been improving. [facility] and noticed [facility] little spotting.  UTI in the last 6 weeks? No  Pain? See above She has returned to her normal activity (except for postop restrictions) Vaginal bulge? No  Stress incontinence: No  Urgency/frequency: No  Urge incontinence: No  Voiding dysfunction: No  Bowel issues: No - has been taking miralax  as needed  Subjective Success: Do you usually have a bulge or something falling out that you can see or feel in the vaginal area? No  Retreatment Success: Any retreatment with surgery or pessary for any compartment? No    Medications: She has a current medication list which includes the following prescription(s): acetaminophen , ascorbic acid, fifty50 glucose meter 2.0, carboxymethylcellulose, cetirizine, vitamin d3, clonazepam , conjugated estrogens, dexcom g6 sensor, dapagliflozin propanediol, dexlansoprazole, famotidine , ferrous sulfate , gabapentin , insulin  glargine, insulin  pen needle, onetouch ultrasoft, latanoprost, metformin, onetouch verio, polyethylene glycol powder, ramipril, rosuvastatin, ozempic (0.25 or 0.5 mg/dose), timolol, turmeric, valacyclovir, and zinc gluconate.   Allergies: Patient is allergic to biaxin [clarithromycin], byetta 10 mcg pen [exenatide],  ibuprofen , robaxin [methocarbamol], shellfish allergy, ampicillin, carafate [sucralfate], cephalexin, covid-19 (adenovirus) vaccine, cyanocobalamin, flagyl  [metronidazole ], glyburide, lyrica [pregabalin], prednisone, vitamin b12, ketoconazole, mango flavoring agent (non-screening), pineapple, and strawberry flavoring agent (non-screening).   Physical Exam: BP 99/65   Pulse 69    Pelvic Examination: perineal incision well healed Vagina: Incisions healing well. Sutures are present at incision lines and apex and there is some granulation tissue near on the posterior wall near the introitus, treated with silver nitrate. No tenderness along the anterior or posterior vagina. No apical tenderness. No pelvic masses. POP-Q: POP-Q  -3                                            Aa   -3                                           Ba  -6                                              C   3                                            Gh  3.5  Pb  6                                            tvl   -3                                            Ap  -3                                            Bp                                                 D    ---------------------------------------------------------  Assessment and Plan:  1. Post-operative state     - Healing well but some granulation on posterior wall treated with silver nitrate today. Will plan to reexamine in a month.   - Can resume regular activity including exercise. Discussed avoidance of heavy lifting and straining Toohey term to reduce the risk of recurrence. Wait until after next visit for intercouse.   Return in about 4 weeks (around 04/24/2024) for post op visit.  Arma Lamp, MD

## 2024-04-03 DIAGNOSIS — H0288B Meibomian gland dysfunction left eye, upper and lower eyelids: Secondary | ICD-10-CM | POA: Diagnosis not present

## 2024-04-03 DIAGNOSIS — E113212 Type 2 diabetes mellitus with mild nonproliferative diabetic retinopathy with macular edema, left eye: Secondary | ICD-10-CM | POA: Diagnosis not present

## 2024-04-03 DIAGNOSIS — Z961 Presence of intraocular lens: Secondary | ICD-10-CM | POA: Diagnosis not present

## 2024-04-03 DIAGNOSIS — H18421 Band keratopathy, right eye: Secondary | ICD-10-CM | POA: Diagnosis not present

## 2024-04-03 DIAGNOSIS — E113291 Type 2 diabetes mellitus with mild nonproliferative diabetic retinopathy without macular edema, right eye: Secondary | ICD-10-CM | POA: Diagnosis not present

## 2024-04-03 DIAGNOSIS — H401131 Primary open-angle glaucoma, bilateral, mild stage: Secondary | ICD-10-CM | POA: Diagnosis not present

## 2024-04-03 DIAGNOSIS — H0288A Meibomian gland dysfunction right eye, upper and lower eyelids: Secondary | ICD-10-CM | POA: Diagnosis not present

## 2024-04-03 DIAGNOSIS — Z794 Long term (current) use of insulin: Secondary | ICD-10-CM | POA: Diagnosis not present

## 2024-04-11 ENCOUNTER — Telehealth: Payer: Self-pay

## 2024-04-11 NOTE — Telephone Encounter (Signed)
 Patient called stating she receives premarin fro [employer]. [employer] needed to know how much she is should to taking Premarin per Dr.Schroeder. Patient PCP is the prescribing doctor but patient want to know if everyone is on the same page. She believe she should take medication twice a week.

## 2024-04-12 NOTE — Telephone Encounter (Signed)
 Yes, usually should be using 0.5g of the cream two times a week.

## 2024-04-17 NOTE — Telephone Encounter (Signed)
 Left message to return call

## 2024-04-23 DIAGNOSIS — E1165 Type 2 diabetes mellitus with hyperglycemia: Secondary | ICD-10-CM | POA: Diagnosis not present

## 2024-04-23 DIAGNOSIS — E782 Mixed hyperlipidemia: Secondary | ICD-10-CM | POA: Diagnosis not present

## 2024-04-24 DIAGNOSIS — N182 Chronic kidney disease, stage 2 (mild): Secondary | ICD-10-CM | POA: Diagnosis not present

## 2024-04-24 DIAGNOSIS — I10 Essential (primary) hypertension: Secondary | ICD-10-CM | POA: Diagnosis not present

## 2024-04-24 DIAGNOSIS — E782 Mixed hyperlipidemia: Secondary | ICD-10-CM | POA: Diagnosis not present

## 2024-04-24 DIAGNOSIS — E113293 Type 2 diabetes mellitus with mild nonproliferative diabetic retinopathy without macular edema, bilateral: Secondary | ICD-10-CM | POA: Diagnosis not present

## 2024-04-24 DIAGNOSIS — E1165 Type 2 diabetes mellitus with hyperglycemia: Secondary | ICD-10-CM | POA: Diagnosis not present

## 2024-04-26 ENCOUNTER — Encounter: Payer: Self-pay | Admitting: Obstetrics and Gynecology

## 2024-04-26 ENCOUNTER — Ambulatory Visit (INDEPENDENT_AMBULATORY_CARE_PROVIDER_SITE_OTHER): Admitting: Obstetrics and Gynecology

## 2024-04-26 VITALS — BP 116/78 | HR 74

## 2024-04-26 DIAGNOSIS — Z9889 Other specified postprocedural states: Secondary | ICD-10-CM

## 2024-04-26 DIAGNOSIS — Z48816 Encounter for surgical aftercare following surgery on the genitourinary system: Secondary | ICD-10-CM

## 2024-04-26 NOTE — Progress Notes (Addendum)
 [facility]  Date of Visit: 04/26/2024  History of Present Illness: Ms. Pha is a 68 y.o. female scheduled today for a post-operative visit.    Surgery: s/p Anterior and posterior repair with perineorrhaphy, sacrospinous ligament fixation, cystoscopy, urethral bulking (Bulkamid) on 02/06/24  She did not pass her postoperative void trial. She returned on 02/08/24 and passed her voiding trial.   She reports that she is feeling a lot better. No longer having any pain. Denies vaginal bleeding or discharge. Has returned to her normal activity. She is using her vaginal estrogen. No urinary leakage.   Medications: She has a current medication list which includes the following prescription(s): acetaminophen , fifty50 glucose meter 2.0, carboxymethylcellulose, cetirizine, vitamin d3, clonazepam , conjugated estrogens, dapagliflozin propanediol, dexlansoprazole, ferrous sulfate , insulin  glargine, insulin  pen needle, onetouch ultrasoft, latanoprost, metformin, multiple vitamins-minerals, onetouch verio, ramipril, rosuvastatin, ozempic (0.25 or 0.5 mg/dose), timolol, valacyclovir, polyethylene glycol powder, turmeric, valacyclovir, and zinc gluconate.   Allergies: Patient is allergic to biaxin [clarithromycin], byetta 10 mcg pen [exenatide], ibuprofen , robaxin [methocarbamol], shellfish allergy, ampicillin, carafate [sucralfate], cephalexin, covid-19 (adenovirus) vaccine, cyanocobalamin, flagyl  [metronidazole ], glyburide, lyrica [pregabalin], prednisone, vitamin b12, ketoconazole, mango flavoring agent (non-screening), pineapple, and strawberry flavoring agent (non-screening).   Physical Exam: BP 116/78   Pulse 74    Pelvic Examination: perineal incision well healed Vagina: Incisions healing well. Sutures are present at apex and there is some granulation tissue at the apex, treated with silver nitrate. Other suture lines well healed.  No tenderness along the anterior or posterior vagina. No apical  tenderness. No pelvic masses. POP-Q: deferred ---------------------------------------------------------  Assessment and Plan:  1. Post-operative state      - Well healed overall. Used some silver nitrate at apex. We discussed the apical sutures will take a little longer to dissolve.  - Ok to resume intercourse.  - Continue to use premarin twice weekly for atrophy  Return as needed  Rosaline LOISE Caper, MD

## 2024-04-29 ENCOUNTER — Ambulatory Visit (INDEPENDENT_AMBULATORY_CARE_PROVIDER_SITE_OTHER)

## 2024-04-29 ENCOUNTER — Ambulatory Visit: Admitting: Podiatry

## 2024-04-29 DIAGNOSIS — S90851A Superficial foreign body, right foot, initial encounter: Secondary | ICD-10-CM | POA: Diagnosis not present

## 2024-04-29 DIAGNOSIS — G5761 Lesion of plantar nerve, right lower limb: Secondary | ICD-10-CM

## 2024-04-29 NOTE — Telephone Encounter (Signed)
 Patient is working with her primary MD and the assistance program for her premarin cream. We clarified the dose as she requested.

## 2024-04-29 NOTE — Progress Notes (Addendum)
  Subjective:  Patient ID: Bonnie [name], female    DOB: October 02, 1956,   MRN: [id number]  Chief Complaint  Patient presents with   Foot Injury    Rm9 possible foreign body in right hallux/ 1 weel/sore to touch/diabetic A1C 7.1/ no treatment    68 y.o. female presents for concern  of possible glass in her right great toe. Relates a week ago she dropped an antique dish and did have a shard of glass she picked out of the left foot and is doing well. Relates concern for right foot pain in the toe and in an area of previous surgery. Relates she is concernerd glass could be present as well  . Denies any other pedal complaints. Denies n/v/f/c.   Past Medical History:  Diagnosis Date   Anemia    Angina    Anxiety    Carpal tunnel syndrome 12/07/2015   Bilateral   Cyst of brain 2005   Diabetes mellitus    GERD (gastroesophageal reflux disease)    Hypercholesteremia    Hypertension    Obesity    Wears dentures    full set    Objective:  Physical Exam: Vascular: DP/PT pulses 2/4 bilateral. CFT <3 seconds. Normal hair growth on digits. No edema.  Skin. No lacerations or abrasions bilateral feet. Hyperkeratotic cored lesion noted to plantar left hallux. Possible minuscule pieve of glass removed from superficial layers.  Musculoskeletal: MMT 5/5 bilateral lower extremities in DF, PF, Inversion and Eversion. Deceased ROM in DF of ankle joint. Tender to right third interspace and positive mulder's click noted. Pain with metatarsal squeeze. Tender to plantar right hallux.  Neurological: Sensation intact to light touch.   Assessment:   1. Foreign body in right foot, initial encounter   2. Morton neuroma, right      Plan:  Patient was evaluated and treated and all questions answered. -Discussed  foreign bodies and neuroma with patient and treatment options.  -Radiographs reviewed and no obvious foreign body noted.  -Hyperkeratotic tissue was debrided with chisel without incident. Possible small  pieces of glas removed without incident and without anesthetic to patient comfort. Covered with neosporin and bandaid.  -Metatarsal pad provided to help with neuroma pain -Encouraged daily moisturizing -Discussed use of pumice stone -Advised good supportive shoes and inserts -Patient to return to office as needed or sooner if condition worsens.   Jennefer Moats, DPM

## 2024-04-30 ENCOUNTER — Encounter: Payer: Self-pay | Admitting: Podiatry

## 2024-04-30 ENCOUNTER — Ambulatory Visit: Admitting: Podiatry

## 2024-04-30 VITALS — Ht 68.0 in | Wt 181.0 lb

## 2024-04-30 DIAGNOSIS — E1142 Type 2 diabetes mellitus with diabetic polyneuropathy: Secondary | ICD-10-CM | POA: Diagnosis not present

## 2024-04-30 DIAGNOSIS — L84 Corns and callosities: Secondary | ICD-10-CM

## 2024-04-30 DIAGNOSIS — N182 Chronic kidney disease, stage 2 (mild): Secondary | ICD-10-CM | POA: Insufficient documentation

## 2024-04-30 DIAGNOSIS — R1310 Dysphagia, unspecified: Secondary | ICD-10-CM | POA: Insufficient documentation

## 2024-04-30 DIAGNOSIS — K5900 Constipation, unspecified: Secondary | ICD-10-CM | POA: Insufficient documentation

## 2024-04-30 DIAGNOSIS — F1190 Opioid use, unspecified, uncomplicated: Secondary | ICD-10-CM | POA: Insufficient documentation

## 2024-04-30 DIAGNOSIS — M79675 Pain in left toe(s): Secondary | ICD-10-CM | POA: Diagnosis not present

## 2024-04-30 DIAGNOSIS — M150 Primary generalized (osteo)arthritis: Secondary | ICD-10-CM | POA: Insufficient documentation

## 2024-04-30 DIAGNOSIS — D500 Iron deficiency anemia secondary to blood loss (chronic): Secondary | ICD-10-CM | POA: Insufficient documentation

## 2024-04-30 DIAGNOSIS — B351 Tinea unguium: Secondary | ICD-10-CM

## 2024-04-30 DIAGNOSIS — K222 Esophageal obstruction: Secondary | ICD-10-CM | POA: Insufficient documentation

## 2024-04-30 DIAGNOSIS — M79674 Pain in right toe(s): Secondary | ICD-10-CM

## 2024-04-30 DIAGNOSIS — R933 Abnormal findings on diagnostic imaging of other parts of digestive tract: Secondary | ICD-10-CM | POA: Insufficient documentation

## 2024-04-30 DIAGNOSIS — Z794 Long term (current) use of insulin: Secondary | ICD-10-CM | POA: Insufficient documentation

## 2024-04-30 DIAGNOSIS — R066 Hiccough: Secondary | ICD-10-CM | POA: Insufficient documentation

## 2024-05-05 NOTE — Progress Notes (Signed)
 Subjective:  Patient ID: Bonnie [name], female    DOB: 10-03-1956,  MRN: [id number]  Bonnie KANDICE Haff presents to clinic today for: at risk foot care with history of diabetic neuropathy and callus(es) of both feet and painful mycotic toenails that are difficult to trim. Painful toenails interfere with ambulation. Aggravating factors include wearing enclosed shoe gear. Pain is relieved with periodic professional debridement. Painful calluses are aggravated when weightbearing with and without shoegear. Pain is relieved with periodic professional debridement.  Chief Complaint  Patient presents with   Nail Problem    Pt is here for [facility] last A1C was 7.1 PCP is Dr Verdia and LOV was in May.    PCP is Verdia Lombard, MD.  Allergies  Allergen Reactions   Biaxin [Clarithromycin] Shortness Of Breath, Swelling, Anxiety and Other (See Comments)    Throat swelling Other Reaction: laryngeal edema    Byetta 10 Mcg Pen [Exenatide] Anaphylaxis, Rash and Other (See Comments)     Caused Sweating, sores in the mouth  Also gum soreness/irritation   Ibuprofen  Rash and Other (See Comments)    Nose bleed   Robaxin [Methocarbamol] Anaphylaxis and Swelling    Swollen eyes   Shellfish Allergy Nausea And Vomiting   Ampicillin Nausea And Vomiting    Possible Irregular Heart rate    Carafate [Sucralfate] Nausea And Vomiting   Cephalexin     Other reaction(s): breathing issues    Covid-19 (Adenovirus) Vaccine     Vitiligo possible reaction    Cyanocobalamin     B-12 caused hyper   Flagyl  [Metronidazole ] Nausea And Vomiting, Nausea Only and Swelling    Pt stated, Eyes swelled up    Glyburide     Other reaction(s): ??    Lyrica [Pregabalin]     Other reaction(s): disoriented   Prednisone     Other reaction(s): BS fluctuations   Vitamin B12 Other (See Comments)    Hyperness    Ketoconazole Nausea And Vomiting and Palpitations    Other reaction(s): Irregular Heart Rate Pt unsure of having  reaction    Mango Flavoring Agent (Non-Screening) Itching and Rash   Pineapple Itching and Rash   Strawberry Flavoring Agent (Non-Screening) Itching    Review of Systems: Negative except as noted in the HPI.  Objective:   Dionisia Pacholski Rastetter is a pleasant 68 y.o. female WD, WN in NAD. AAO x 3.  Vascular Examination: Capillary refill time immediate b/l. Vascular status intact b/l with palpable pedal pulses. Pedal hair present b/l. No pain with calf compression b/l. Skin temperature gradient WNL b/l. No cyanosis or clubbing b/l. No ischemia or gangrene noted b/l.   Neurological Examination: Sensation grossly intact b/l with 10 gram monofilament. Pt has subjective symptoms of neuropathy.   Dermatological Examination: Pedal skin with normal turgor, texture and tone b/l.  No open wounds. No interdigital macerations.   Toenails 2-5 b/l thick, discolored, elongated with subungual debris and pain on dorsal palpation.   Anonychia noted bilateral great toes. Nailbed(s) epithelialized.  Hyperkeratotic lesion(s) submet head 1 b/l and submet head 5 b/l and sub 5th met base b/l.  No erythema, no edema, no drainage, no fluctuance.  Musculoskeletal Examination: Normal muscle strength 5/5 to all lower extremity muscle groups bilaterally. No pain, crepitus or joint limitation noted with ROM b/l LE. No gross bony pedal deformities b/l. Patient ambulates independently without assistive aids.  Radiographs: None    Latest Ref Rng & Units 02/06/2024   11:09 AM  Hemoglobin A1C  Hemoglobin-A1c  4.8 - 5.6 % 7.2    Assessment/Plan: 1. Pain due to onychomycosis of toenails of both feet   2. Callus   3. Diabetic peripheral neuropathy associated with type 2 diabetes mellitus (HCC)   Patient was evaluated and treated. All patient's and/or POA's questions/concerns addressed on today's visit. Toenails 1-5 debrided in length and girth without incident. Callus(es) submet head 1 b/l, submet head 5 b/l, sub 5th met base  left foot, and sub 5th met base right foot pared with sharp debridement without incident. Continue daily foot inspections and monitor blood glucose per PCP/Endocrinologist's recommendations. Continue soft, supportive shoe gear daily. Report any pedal injuries to medical professional. Call office if there are any questions/concerns.  Return in about 3 months (around 07/31/2024).  Delon LITTIE Merlin, DPM      [facility] LOCATION: [address] [place] [address]                   Office [phone]   [facility] LOCATION: [address] [place], [place] [address] Office [phone]

## 2024-05-09 DIAGNOSIS — H401131 Primary open-angle glaucoma, bilateral, mild stage: Secondary | ICD-10-CM | POA: Diagnosis not present

## 2024-05-15 DIAGNOSIS — E782 Mixed hyperlipidemia: Secondary | ICD-10-CM | POA: Diagnosis not present

## 2024-05-15 DIAGNOSIS — N182 Chronic kidney disease, stage 2 (mild): Secondary | ICD-10-CM | POA: Diagnosis not present

## 2024-05-15 DIAGNOSIS — M150 Primary generalized (osteo)arthritis: Secondary | ICD-10-CM | POA: Diagnosis not present

## 2024-05-15 DIAGNOSIS — E113293 Type 2 diabetes mellitus with mild nonproliferative diabetic retinopathy without macular edema, bilateral: Secondary | ICD-10-CM | POA: Diagnosis not present

## 2024-05-15 DIAGNOSIS — Z794 Long term (current) use of insulin: Secondary | ICD-10-CM | POA: Diagnosis not present

## 2024-05-15 DIAGNOSIS — I10 Essential (primary) hypertension: Secondary | ICD-10-CM | POA: Diagnosis not present

## 2024-05-15 DIAGNOSIS — I700 Atherosclerosis of aorta: Secondary | ICD-10-CM | POA: Diagnosis not present

## 2024-05-15 DIAGNOSIS — Z5181 Encounter for therapeutic drug level monitoring: Secondary | ICD-10-CM | POA: Diagnosis not present

## 2024-06-06 DIAGNOSIS — E113293 Type 2 diabetes mellitus with mild nonproliferative diabetic retinopathy without macular edema, bilateral: Secondary | ICD-10-CM | POA: Diagnosis not present

## 2024-06-06 DIAGNOSIS — N182 Chronic kidney disease, stage 2 (mild): Secondary | ICD-10-CM | POA: Diagnosis not present

## 2024-06-06 DIAGNOSIS — I10 Essential (primary) hypertension: Secondary | ICD-10-CM | POA: Diagnosis not present

## 2024-06-06 DIAGNOSIS — E782 Mixed hyperlipidemia: Secondary | ICD-10-CM | POA: Diagnosis not present

## 2024-06-06 DIAGNOSIS — E1165 Type 2 diabetes mellitus with hyperglycemia: Secondary | ICD-10-CM | POA: Diagnosis not present

## 2024-06-26 DIAGNOSIS — G2581 Restless legs syndrome: Secondary | ICD-10-CM | POA: Diagnosis not present

## 2024-07-09 ENCOUNTER — Ambulatory Visit: Payer: Medicare Other | Admitting: Podiatry

## 2024-07-25 DIAGNOSIS — Z794 Long term (current) use of insulin: Secondary | ICD-10-CM | POA: Diagnosis not present

## 2024-07-25 DIAGNOSIS — E113291 Type 2 diabetes mellitus with mild nonproliferative diabetic retinopathy without macular edema, right eye: Secondary | ICD-10-CM | POA: Diagnosis not present

## 2024-07-25 DIAGNOSIS — E113212 Type 2 diabetes mellitus with mild nonproliferative diabetic retinopathy with macular edema, left eye: Secondary | ICD-10-CM | POA: Diagnosis not present

## 2024-07-25 DIAGNOSIS — H0014 Chalazion left upper eyelid: Secondary | ICD-10-CM | POA: Diagnosis not present

## 2024-07-25 DIAGNOSIS — H401131 Primary open-angle glaucoma, bilateral, mild stage: Secondary | ICD-10-CM | POA: Diagnosis not present

## 2024-07-25 DIAGNOSIS — H59031 Cystoid macular edema following cataract surgery, right eye: Secondary | ICD-10-CM | POA: Diagnosis not present

## 2024-07-25 DIAGNOSIS — H35043 Retinal micro-aneurysms, unspecified, bilateral: Secondary | ICD-10-CM | POA: Diagnosis not present

## 2024-07-25 DIAGNOSIS — E1165 Type 2 diabetes mellitus with hyperglycemia: Secondary | ICD-10-CM | POA: Diagnosis not present

## 2024-08-05 DIAGNOSIS — E113293 Type 2 diabetes mellitus with mild nonproliferative diabetic retinopathy without macular edema, bilateral: Secondary | ICD-10-CM | POA: Diagnosis not present

## 2024-08-05 DIAGNOSIS — E782 Mixed hyperlipidemia: Secondary | ICD-10-CM | POA: Diagnosis not present

## 2024-08-05 DIAGNOSIS — N182 Chronic kidney disease, stage 2 (mild): Secondary | ICD-10-CM | POA: Diagnosis not present

## 2024-08-05 DIAGNOSIS — E1165 Type 2 diabetes mellitus with hyperglycemia: Secondary | ICD-10-CM | POA: Diagnosis not present

## 2024-08-05 DIAGNOSIS — I10 Essential (primary) hypertension: Secondary | ICD-10-CM | POA: Diagnosis not present

## 2024-08-05 DIAGNOSIS — E1121 Type 2 diabetes mellitus with diabetic nephropathy: Secondary | ICD-10-CM | POA: Diagnosis not present

## 2024-08-06 ENCOUNTER — Ambulatory Visit (INDEPENDENT_AMBULATORY_CARE_PROVIDER_SITE_OTHER): Admitting: Podiatry

## 2024-08-06 ENCOUNTER — Encounter: Payer: Self-pay | Admitting: Podiatry

## 2024-08-06 DIAGNOSIS — M79675 Pain in left toe(s): Secondary | ICD-10-CM

## 2024-08-06 DIAGNOSIS — M79674 Pain in right toe(s): Secondary | ICD-10-CM | POA: Diagnosis not present

## 2024-08-06 DIAGNOSIS — L84 Corns and callosities: Secondary | ICD-10-CM

## 2024-08-06 DIAGNOSIS — E1142 Type 2 diabetes mellitus with diabetic polyneuropathy: Secondary | ICD-10-CM | POA: Diagnosis not present

## 2024-08-06 DIAGNOSIS — B351 Tinea unguium: Secondary | ICD-10-CM | POA: Diagnosis not present

## 2024-08-07 NOTE — Progress Notes (Signed)
 Subjective:  Patient ID: Bonnie [name], female    DOB: May 13, 1956,  MRN: [id number]  Bonnie [name] presents to clinic today for at risk foot care with history of diabetic neuropathy and callus(es) b/l feet and painful mycotic toenails that are difficult to trim. Painful toenails interfere with ambulation. Aggravating factors include wearing enclosed shoe gear. Pain is relieved with periodic professional debridement. Painful calluses are aggravated when weightbearing with and without shoegear. Pain is relieved with periodic professional debridement.  Chief Complaint  Patient presents with   Diabetes    DFC IDDM A1C 7.2. LOV with PCP 06/26/24. Toenail trim and callus care.   New problem(s): None.   PCP is Verdia Lombard, MD.  Allergies  Allergen Reactions   Biaxin [Clarithromycin] Shortness Of Breath, Swelling, Anxiety and Other (See Comments)    Throat swelling Other Reaction: laryngeal edema    Byetta 10 Mcg Pen [Exenatide] Anaphylaxis, Rash and Other (See Comments)     Caused Sweating, sores in the mouth  Also gum soreness/irritation   Ibuprofen  Rash and Other (See Comments)    Nose bleed   Robaxin [Methocarbamol] Anaphylaxis and Swelling    Swollen eyes   Shellfish Allergy Nausea And Vomiting   Ampicillin Nausea And Vomiting    Possible Irregular Heart rate    Carafate [Sucralfate] Nausea And Vomiting   Cephalexin     Other reaction(s): breathing issues    Covid-19 (Adenovirus) Vaccine     Vitiligo possible reaction    Cyanocobalamin     B-12 caused hyper   Flagyl  [Metronidazole ] Nausea And Vomiting, Nausea Only and Swelling    Pt stated, Eyes swelled up    Glyburide     Other reaction(s): ??    Lyrica [Pregabalin]     Other reaction(s): disoriented   Prednisone     Other reaction(s): BS fluctuations   Vitamin B12 Other (See Comments)    Hyperness    Ketoconazole Nausea And Vomiting and Palpitations    Other reaction(s): Irregular Heart Rate Pt unsure of having  reaction    Mango Flavoring Agent (Non-Screening) Itching and Rash   Pineapple Itching and Rash   Strawberry Flavoring Agent (Non-Screening) Itching    Review of Systems: Negative except as noted in the HPI.  Objective: No changes noted in today's physical examination. There were no vitals filed for this visit. Bonnie [name] is a pleasant 68 y.o. female WD, WN in NAD. AAO x 3.  Vascular Examination: Capillary refill time immediate b/l. Vascular status intact b/l with palpable pedal pulses. Pedal hair present b/l. No pain with calf compression b/l. Skin temperature gradient WNL b/l. No cyanosis or clubbing b/l. No ischemia or gangrene noted b/l.   Neurological Examination: Sensation grossly intact b/l with 10 gram monofilament. Pt has subjective symptoms of neuropathy.   Dermatological Examination: Pedal skin with normal turgor, texture and tone b/l.  No open wounds. No interdigital macerations.   Toenails 2-5 b/l thick, discolored, elongated with subungual debris and pain on dorsal palpation.   Anonychia noted bilateral great toes. Nailbed(s) epithelialized.  Hyperkeratotic lesion(s) submet head 1 b/l and submet head 5 b/l and sub 5th met base b/l.  No erythema, no edema, no drainage, no fluctuance.  Musculoskeletal Examination: Normal muscle strength 5/5 to all lower extremity muscle groups bilaterally. No pain, crepitus or joint limitation noted with ROM b/l LE.Plantarflexed metatarsal(s) 1st metatarsal head b/l feet. Patient ambulates independently without assistive aids.  Radiographs: None  Assessment/Plan: 1. Pain due to onychomycosis of  toenails of both feet   2. Callus   3. Diabetic peripheral neuropathy associated with type 2 diabetes mellitus [facility])   Consent given for treatment. Patient examined. All patient's and/or POA's questions/concerns addressed on today's visit. Mycotic toenails 1-5 debrided in length and girth without incident. Callus(es) submet head 1 b/l, submet  head 5 b/l, and medial IPJ b/l great toes pared with sharp debridement without incident. Treatment was provided by assistant Bonnie [name] under my supervision. Continue foot and shoe inspections daily. Monitor blood glucose per PCP/Endocrinologist's recommendations.Continue soft, supportive shoe gear daily. Report any pedal injuries to medical professional. Call office if there are any quesitons/concerns.  Return in about 3 months (around 11/05/2024).  Bonnie [name], DPM      [place] LOCATION: [address], [place] [address]                   Office [phone]   [facility] LOCATION: [address] [place], [place] [address] Office [phone]

## 2024-08-14 ENCOUNTER — Ambulatory Visit: Admitting: Podiatry

## 2024-08-15 DIAGNOSIS — D492 Neoplasm of unspecified behavior of bone, soft tissue, and skin: Secondary | ICD-10-CM | POA: Diagnosis not present

## 2024-08-15 DIAGNOSIS — L728 Other follicular cysts of the skin and subcutaneous tissue: Secondary | ICD-10-CM | POA: Diagnosis not present

## 2024-08-15 DIAGNOSIS — L538 Other specified erythematous conditions: Secondary | ICD-10-CM | POA: Diagnosis not present

## 2024-09-09 ENCOUNTER — Telehealth: Payer: Self-pay

## 2024-09-09 NOTE — Telephone Encounter (Signed)
 Patient called stating she woke up with a spot of blood after using Premarin cream the night before. She has been using the cream and never had this issue before but she wants to know if the cream may have made her bleed and should she stop it. She only had bleeding once. Please advise.

## 2024-09-11 NOTE — Telephone Encounter (Signed)
 Patient is scheduled for 11/06 for office visit.

## 2024-09-18 DIAGNOSIS — E1121 Type 2 diabetes mellitus with diabetic nephropathy: Secondary | ICD-10-CM | POA: Insufficient documentation

## 2024-09-18 DIAGNOSIS — G2581 Restless legs syndrome: Secondary | ICD-10-CM | POA: Insufficient documentation

## 2024-09-19 ENCOUNTER — Encounter: Payer: Self-pay | Admitting: Obstetrics and Gynecology

## 2024-09-19 ENCOUNTER — Ambulatory Visit: Admitting: Obstetrics and Gynecology

## 2024-09-19 VITALS — BP 120/64 | HR 83 | Ht 68.0 in | Wt 181.0 lb

## 2024-09-19 DIAGNOSIS — N898 Other specified noninflammatory disorders of vagina: Secondary | ICD-10-CM

## 2024-09-19 DIAGNOSIS — N941 Unspecified dyspareunia: Secondary | ICD-10-CM | POA: Diagnosis not present

## 2024-09-19 NOTE — Progress Notes (Signed)
 [facility] Return Visit  SUBJECTIVE  History of Present Illness: Bonnie [name] is a 68 y.o. female seen in follow-up for vaginal bleeding and pain. Patient reports she has tried to have intercourse and feels pain and irritation at a couple different spots. She has pain at the opening of the vagina and deeper in on her right side. She has been trying to use her premarin cream but thinks that the applicator is causing her irritation.     Past Medical History: Patient  has a past medical history of Anemia, Angina, Anxiety, Carpal tunnel syndrome (12/07/2015), Cyst of brain (2005), Diabetes mellitus, GERD (gastroesophageal reflux disease), Hypercholesteremia, Hypertension, Obesity, and Wears dentures.   Past Surgical History: She  has a past surgical history that includes Brain surgery; Back surgery; Brain surgery; Vaginal hysterectomy; Shoulder surgery; Coccyx removal; Tonsillectomy; Bladder surgery; Colonoscopy (Left, 04/22/2014); Esophagogastroduodenoscopy (egd) with propofol  (N/A, 02/12/2021); Savory dilation (N/A, 02/12/2021); Anterior and posterior repair with sacrospinous fixation (N/A, 02/06/2024); and Cystoscopy (N/A, 02/06/2024).   Medications: She has a current medication list which includes the following prescription(s): acetaminophen , fifty50 glucose meter 2.0, carboxymethylcellulose, cetirizine, vitamin d3, clonazepam , conjugated estrogens, dexcom g7 sensor, dapagliflozin propanediol, dexlansoprazole, doxycycline, ferrous sulfate , insulin  glargine, insulin  pen needle, onetouch ultrasoft, latanoprost, metformin, multiple vitamins-minerals, onetouch verio, polyethylene glycol powder, ramipril, rosuvastatin, ozempic (0.25 or 0.5 mg/dose), timolol, turmeric, valacyclovir, and zinc gluconate.   Allergies: Patient is allergic to biaxin [clarithromycin], byetta 10 mcg pen [exenatide], ibuprofen , robaxin [methocarbamol], shellfish allergy, ampicillin, carafate [sucralfate], cephalexin,  covid-19 (adenovirus) vaccine, cyanocobalamin, flagyl  [metronidazole ], glyburide, lyrica [pregabalin], prednisone, vitamin b12, ketoconazole, mango flavoring agent (non-screening), pineapple, and strawberry flavoring agent (non-screening).   Social History: Patient  reports that she quit smoking about 27 years ago. Her smoking use included cigarettes. She started smoking about 53 years ago. She has a 13 pack-year smoking history. She has never used smokeless tobacco. She reports current alcohol use. She reports that she does not use drugs.     OBJECTIVE     Physical Exam: Vitals:   09/19/24 1313  BP: 120/64  Pulse: 83  Weight: 181 lb (82.1 kg)  Height: 5' 8 (1.727 m)   Gen: No apparent distress, A&O x 3.  Detailed Urogynecologic Evaluation:  External vaginal exam normal. Manual exam at vaginal opening shows patient with pain at the posterior fourchette with pressure put. Bruising noted at the anterior vaginal wall likely from reported applicator scratching.  Speculum exam reveals a spot of granulation tissue on the right posterior vaginal side that was tender to palpation. Treated with silver nitrate. Vaginal tissues noted to be dry.    ASSESSMENT AND PLAN    Bonnie [name] is a 68 y.o. with:  1. Dyspareunia, female    Granulation tissue treated with silver nitrate. Encouraged patient to resume vaginal estrogen use with her finger instead of applicator. For intercourse I also encouraged patient to use lubricant as they reported to previously using coconut oil. I suggested a silicon blend or base lubrication for postmenopausal patients.   Patient to follow up for a recheck with Dr. Marilynne in 4 weeks or sooner if needed.    Draya Felker G Jonell Brumbaugh, NP

## 2024-09-19 NOTE — Patient Instructions (Addendum)
 Please use the estrogen cream with your finger nightly for the next week and then drop down to twice a week.   We will have Dr. Marilynne check your perineal region with the pain at the vaginal opening and to make sure the spot has healed on the right internal side.

## 2024-09-23 ENCOUNTER — Telehealth: Payer: Self-pay

## 2024-09-23 NOTE — Telephone Encounter (Signed)
 Patient is having trouble with constipation even taking her metamucil daily. She just wanted to check if if could take a laxative. She has MOM at home and will try that.

## 2024-10-02 ENCOUNTER — Ambulatory Visit: Admitting: Obstetrics and Gynecology

## 2024-10-02 ENCOUNTER — Encounter: Payer: Self-pay | Admitting: Obstetrics and Gynecology

## 2024-10-02 VITALS — BP 117/69 | HR 64

## 2024-10-02 DIAGNOSIS — N941 Unspecified dyspareunia: Secondary | ICD-10-CM | POA: Diagnosis not present

## 2024-10-02 NOTE — Progress Notes (Signed)
 [facility] Return Visit  SUBJECTIVE  History of Present Illness: Bonnie [name] is a 68 y.o. female s/p Anterior and posterior repair with perineorrhaphy, sacrospinous ligament fixation, cystoscopy, urethral bulking ([place]) on 02/05/21.   Last visit some granulation tissue was treated at the apex. Pt started using her finger to apply the estrogen cream as the applicator was scraping and it feels a lot better. Has not noticed any further vaginal bleeding.   Past Medical History: Patient  has a past medical history of Anemia, Angina, Anxiety, Carpal tunnel syndrome (12/07/2015), Cyst of brain (2005), Diabetes mellitus, GERD (gastroesophageal reflux disease), Hypercholesteremia, Hypertension, Obesity, and Wears dentures.   Past Surgical History: She  has a past surgical history that includes Brain surgery; Back surgery; Brain surgery; Vaginal hysterectomy; Shoulder surgery; Coccyx removal; Tonsillectomy; Bladder surgery; Colonoscopy (Left, 04/22/2014); Esophagogastroduodenoscopy (egd) with propofol  (N/A, 02/12/2021); Savory dilation (N/A, 02/12/2021); Anterior and posterior repair with sacrospinous fixation (N/A, 02/06/2024); and Cystoscopy (N/A, 02/06/2024).   Medications: She has a current medication list which includes the following prescription(s): acetaminophen , fifty50 glucose meter 2.0, carboxymethylcellulose, cetirizine, vitamin d3, clonazepam , conjugated estrogens, dexcom g7 sensor, dapagliflozin propanediol, dexlansoprazole, doxycycline, ferrous sulfate , insulin  glargine, insulin  pen needle, onetouch ultrasoft, latanoprost, metformin, multiple vitamins-minerals, onetouch verio, polyethylene glycol powder, ramipril, rosuvastatin, ozempic (0.25 or 0.5 mg/dose), timolol, turmeric, valacyclovir, and zinc gluconate.   Allergies: Patient is allergic to biaxin [clarithromycin], byetta 10 mcg pen [exenatide], ibuprofen , robaxin [methocarbamol], shellfish allergy, ampicillin, carafate  [sucralfate], cephalexin, covid-19 (adenovirus) vaccine, cyanocobalamin, flagyl  [metronidazole ], glyburide, lyrica [pregabalin], prednisone, vitamin b12, ketoconazole, mango flavoring agent (non-screening), pineapple, and strawberry flavoring agent (non-screening).   Social History: Patient  reports that she quit smoking about 27 years ago. Her smoking use included cigarettes. She started smoking about 53 years ago. She has a 13 pack-year smoking history. She has never used smokeless tobacco. She reports current alcohol use. She reports that she does not use drugs.     OBJECTIVE     Physical Exam: Vitals:   10/02/24 1329  BP: 117/69  Pulse: 64    Gen: No apparent distress, A&O x 3.  Detailed Urogynecologic Evaluation:  External vaginal exam normal.  On speculum, normal vaginal mucosa present with no granulation tissue. Digital exam did not reveal any abnormalities.    ASSESSMENT AND PLAN    Bonnie [name] is a 68 y.o. with:  No diagnosis found.  - Bleeding has resolved with treatment of granulation tissue.  - Continue estrogen cream twice weekly - OK to resume activity including intercourse. Advised to use lubricant to assist.   Follow up as needed.    Bonnie LOISE Caper, MD  Time spent: I spent 25 minutes dedicated to the care of this patient on the date of this encounter to include pre-visit review of records, face-to-face time with the patient and post visit documentation.

## 2024-10-23 ENCOUNTER — Encounter: Payer: Self-pay | Admitting: Podiatry

## 2024-10-23 ENCOUNTER — Ambulatory Visit (INDEPENDENT_AMBULATORY_CARE_PROVIDER_SITE_OTHER): Admitting: Podiatry

## 2024-10-23 DIAGNOSIS — M79675 Pain in left toe(s): Secondary | ICD-10-CM

## 2024-10-23 DIAGNOSIS — B351 Tinea unguium: Secondary | ICD-10-CM | POA: Diagnosis not present

## 2024-10-23 DIAGNOSIS — E1142 Type 2 diabetes mellitus with diabetic polyneuropathy: Secondary | ICD-10-CM | POA: Diagnosis not present

## 2024-10-23 DIAGNOSIS — Q828 Other specified congenital malformations of skin: Secondary | ICD-10-CM

## 2024-10-23 DIAGNOSIS — M79674 Pain in right toe(s): Secondary | ICD-10-CM | POA: Diagnosis not present

## 2024-11-01 NOTE — Progress Notes (Signed)
 "  Subjective:  Patient ID: Bonnie [name], female    DOB: [birth year],  MRN: [id number]  Bonnie [name] presents to clinic today for at risk foot care with history of diabetic neuropathy and painful porokeratotic lesion(s) of both feet and painful mycotic toenails that limit ambulation. Painful toenails interfere with ambulation. Aggravating factors include wearing enclosed shoe gear. Pain is relieved with periodic professional debridement. Painful porokeratotic lesions are aggravated when weightbearing with and without shoegear. Pain is relieved with periodic professional debridement. Her husband is present during today's visit. Chief Complaint  Patient presents with   [facility]    Rm16 Diabetic foot care/ A1c 6.8   New problem(s): None.   PCP is Verdia Lombard, MD. Bonnie [name] 10/08/24.  Allergies[1]  Review of Systems: Negative except as noted in the HPI.  Objective: There were no vitals filed for this visit. Bonnie [name] is a pleasant 68 y.o. female WD, WN in NAD. AAO x 3.  Vascular Examination: Capillary refill time immediate b/l. Vascular status intact b/l with palpable pedal pulses. Pedal hair present b/l. No pain with calf compression b/l. Skin temperature gradient WNL b/l. No cyanosis or clubbing b/l. No ischemia or gangrene noted b/l.   Neurological Examination: Sensation grossly intact b/l with 10 gram monofilament. Pt has subjective symptoms of neuropathy.   Dermatological Examination: Pedal skin with normal turgor, texture and tone b/l.  No open wounds. No interdigital macerations.   Toenails 2-5 b/l thick, discolored, elongated with subungual debris and pain on dorsal palpation.   Anonychia noted bilateral great toes. Nailbed(s) epithelialized.  Porokeratotic lesion(s) submet head 1 b/l and submet head 5 b/l and sub 5th met base b/l.  No erythema, no edema, no drainage, no fluctuance.  Musculoskeletal Examination: Normal muscle strength 5/5 to all lower extremity muscle groups  bilaterally. No pain, crepitus or joint limitation noted with ROM b/l LE.Plantarflexed metatarsal(s) 1st metatarsal head b/l feet. Patient ambulates independently without assistive aids.  Radiographs: None  Assessment/Plan: 1. Pain due to onychomycosis of toenails of both feet   2. Porokeratosis   3. Diabetic peripheral neuropathy associated with type 2 diabetes mellitus (HCC)   Patient was evaluated and treated. All patient's and/or POA's questions/concerns addressed on today's visit. Toenails 1-5 b/l debrided in length and girth without incident. Porokeratotic lesion(s) medial IPJ of left great toe, medial IPJ of right great toe, submet head 1 b/l, and submet head 5 b/l pared with sharp debridement without incident. Continue daily foot inspections and monitor blood glucose per PCP/Endocrinologist's recommendations. Continue soft, supportive shoe gear daily. Report any pedal injuries to medical professional. Call office if there are any questions/concerns.  Return in about 3 months (around 01/21/2025).  Bonnie [name], DPM      [facility] LOCATION: [address] [place] [address]                   Office [phone]   [facility] LOCATION: [address] [place], [place] [address] Office [phone]     [1]  Allergies Allergen Reactions   Biaxin [Clarithromycin] Shortness Of Breath, Swelling, Anxiety and Other (See Comments)    Throat swelling Other Reaction: laryngeal edema    Byetta 10 Mcg  Pen [Exenatide] Anaphylaxis, Rash and Other (See Comments)     Caused Sweating, sores in the mouth  Also gum soreness/irritation   Ibuprofen  Rash and Other (See Comments)    Nose bleed   Robaxin [Methocarbamol] Anaphylaxis and Swelling    Swollen eyes   Shellfish Allergy Nausea And Vomiting   Ampicillin Nausea And Vomiting    Possible Irregular Heart rate    Carafate [Sucralfate] Nausea And Vomiting   Cephalexin      Other reaction(s): breathing issues    Covid-19 (Adenovirus) Vaccine     Vitiligo possible reaction    Cyanocobalamin     B-12 caused hyper   Flagyl  [Metronidazole ] Nausea And Vomiting, Nausea Only and Swelling    Pt stated, Eyes swelled up    Glyburide     Other reaction(s): ??    Lyrica [Pregabalin]     Other reaction(s): disoriented   Prednisone     Other reaction(s): BS fluctuations   Vitamin B12 Other (See Comments)    Hyperness    Ketoconazole Nausea And Vomiting and Palpitations    Other reaction(s): Irregular Heart Rate Pt unsure of having reaction    Mango Flavoring Agent (Non-Screening) Itching and Rash   Pineapple Itching and Rash   Strawberry Flavoring Agent (Non-Screening) Itching   "

## 2024-11-27 ENCOUNTER — Ambulatory Visit: Admitting: Podiatry

## 2024-12-06 ENCOUNTER — Telehealth: Payer: Self-pay

## 2024-12-06 NOTE — Telephone Encounter (Signed)
 Patient contact the office to inform us  she went to [facility] to get U/A on 1/20. Patient provider sent in Nitrofurantoin  as the antibiotic treatment.SABRA

## 2024-12-18 NOTE — Telephone Encounter (Signed)
 I've called [facility] To have them fax over the UA report. Awaiting report.

## 2025-02-05 ENCOUNTER — Ambulatory Visit: Admitting: Podiatry
